# Patient Record
Sex: Female | Born: 1940 | Race: Black or African American | Hispanic: No | Smoking: Former smoker
Health system: Southern US, Community
[De-identification: ages and names within clinical notes are randomized; demographics above are authoritative.]

## PROBLEM LIST (undated history)

## (undated) DIAGNOSIS — Z7901 Long term (current) use of anticoagulants: Secondary | ICD-10-CM

## (undated) DIAGNOSIS — H269 Unspecified cataract: Secondary | ICD-10-CM

## (undated) DIAGNOSIS — I10 Essential (primary) hypertension: Secondary | ICD-10-CM

## (undated) DIAGNOSIS — G40909 Epilepsy, unspecified, not intractable, without status epilepticus: Secondary | ICD-10-CM

## (undated) DIAGNOSIS — M159 Polyosteoarthritis, unspecified: Secondary | ICD-10-CM

## (undated) DIAGNOSIS — I5032 Chronic diastolic (congestive) heart failure: Secondary | ICD-10-CM

## (undated) DIAGNOSIS — M1990 Unspecified osteoarthritis, unspecified site: Secondary | ICD-10-CM

## (undated) DIAGNOSIS — R011 Cardiac murmur, unspecified: Secondary | ICD-10-CM

## (undated) DIAGNOSIS — N183 Chronic kidney disease, stage 3 unspecified: Secondary | ICD-10-CM

## (undated) DIAGNOSIS — I4819 Other persistent atrial fibrillation: Secondary | ICD-10-CM

## (undated) DIAGNOSIS — E0590 Thyrotoxicosis, unspecified without thyrotoxic crisis or storm: Secondary | ICD-10-CM

## (undated) DIAGNOSIS — F0390 Unspecified dementia without behavioral disturbance: Secondary | ICD-10-CM

## (undated) DIAGNOSIS — F1021 Alcohol dependence, in remission: Secondary | ICD-10-CM

## (undated) DIAGNOSIS — I639 Cerebral infarction, unspecified: Secondary | ICD-10-CM

## (undated) DIAGNOSIS — E785 Hyperlipidemia, unspecified: Secondary | ICD-10-CM

## (undated) DIAGNOSIS — M109 Gout, unspecified: Secondary | ICD-10-CM

## (undated) DIAGNOSIS — E559 Vitamin D deficiency, unspecified: Secondary | ICD-10-CM

## (undated) DIAGNOSIS — R001 Bradycardia, unspecified: Secondary | ICD-10-CM

## (undated) DIAGNOSIS — R609 Edema, unspecified: Secondary | ICD-10-CM

## (undated) HISTORY — DX: Long term (current) use of anticoagulants: Z79.01

## (undated) HISTORY — DX: Chronic kidney disease, stage 3 unspecified: N18.30

## (undated) HISTORY — DX: Thyrotoxicosis, unspecified without thyrotoxic crisis or storm: E05.90

## (undated) HISTORY — DX: Polyosteoarthritis, unspecified: M15.9

## (undated) HISTORY — DX: Edema, unspecified: R60.9

## (undated) HISTORY — DX: Alcohol dependence, in remission: F10.21

## (undated) HISTORY — DX: Unspecified cataract: H26.9

## (undated) HISTORY — DX: Bradycardia, unspecified: R00.1

## (undated) HISTORY — DX: Cardiac murmur, unspecified: R01.1

## (undated) HISTORY — DX: Chronic diastolic (congestive) heart failure: I50.32

## (undated) HISTORY — DX: Gout, unspecified: M10.9

## (undated) HISTORY — DX: Other persistent atrial fibrillation: I48.19

## (undated) HISTORY — DX: Chronic kidney disease, stage 3 (moderate): N18.3

## (undated) HISTORY — DX: Vitamin D deficiency, unspecified: E55.9

## (undated) HISTORY — DX: Epilepsy, unspecified, not intractable, without status epilepticus: G40.909

---

## 2010-05-15 ENCOUNTER — Ambulatory Visit: Payer: Self-pay | Admitting: Internal Medicine

## 2010-05-15 ENCOUNTER — Encounter (INDEPENDENT_AMBULATORY_CARE_PROVIDER_SITE_OTHER): Payer: Self-pay | Admitting: Internal Medicine

## 2010-05-15 DIAGNOSIS — R82998 Other abnormal findings in urine: Secondary | ICD-10-CM

## 2010-05-15 DIAGNOSIS — I10 Essential (primary) hypertension: Secondary | ICD-10-CM

## 2010-05-15 DIAGNOSIS — E7800 Pure hypercholesterolemia, unspecified: Secondary | ICD-10-CM

## 2010-05-15 DIAGNOSIS — F0390 Unspecified dementia without behavioral disturbance: Secondary | ICD-10-CM

## 2010-05-15 DIAGNOSIS — M76899 Other specified enthesopathies of unspecified lower limb, excluding foot: Secondary | ICD-10-CM | POA: Insufficient documentation

## 2010-05-15 DIAGNOSIS — F1021 Alcohol dependence, in remission: Secondary | ICD-10-CM | POA: Insufficient documentation

## 2010-05-15 DIAGNOSIS — M129 Arthropathy, unspecified: Secondary | ICD-10-CM | POA: Insufficient documentation

## 2010-05-15 LAB — CONVERTED CEMR LAB
ALT: 12 units/L (ref 0–35)
AST: 21 units/L (ref 0–37)
Alkaline Phosphatase: 157 units/L — ABNORMAL HIGH (ref 39–117)
Basophils Absolute: 0.1 10*3/uL (ref 0.0–0.1)
Basophils Relative: 1 % (ref 0–1)
Calcium: 9.2 mg/dL (ref 8.4–10.5)
Chloride: 106 meq/L (ref 96–112)
Creatinine, Ser: 0.95 mg/dL (ref 0.40–1.20)
LDL Cholesterol: 179 mg/dL — ABNORMAL HIGH (ref 0–99)
MCHC: 31.6 g/dL (ref 30.0–36.0)
Monocytes Absolute: 0.5 10*3/uL (ref 0.1–1.0)
Neutro Abs: 3.4 10*3/uL (ref 1.7–7.7)
Neutrophils Relative %: 53 % (ref 43–77)
Potassium: 4.8 meq/L (ref 3.5–5.3)
RDW: 15.1 % (ref 11.5–15.5)
Total CHOL/HDL Ratio: 3.9

## 2010-05-16 ENCOUNTER — Encounter (INDEPENDENT_AMBULATORY_CARE_PROVIDER_SITE_OTHER): Payer: Self-pay | Admitting: Internal Medicine

## 2010-05-17 ENCOUNTER — Encounter (INDEPENDENT_AMBULATORY_CARE_PROVIDER_SITE_OTHER): Payer: Self-pay | Admitting: Internal Medicine

## 2010-05-22 ENCOUNTER — Ambulatory Visit
Admission: RE | Admit: 2010-05-22 | Discharge: 2010-05-22 | Payer: Self-pay | Source: Home / Self Care | Attending: Internal Medicine | Admitting: Internal Medicine

## 2010-05-30 ENCOUNTER — Encounter (INDEPENDENT_AMBULATORY_CARE_PROVIDER_SITE_OTHER): Payer: Self-pay | Admitting: *Deleted

## 2010-06-21 NOTE — Letter (Signed)
Summary: Generic Letter  [facility]  [address] [place], [place] [address]   Phone: [phone]  Fax: [phone]    05/30/2010  [facility] [address] [place], [place]  [address]  Dear Ms. Markson,  We have been unable to reach you by phone. Your urine culture came back indicating you had small amount of bacteria. Dr. Delrae Alfred believes it could have been contaminated during collection. If you are having any problems urinating, burning sensation or flank pain call our office.     Sincerely,   Gaylyn Cheers RN

## 2010-06-21 NOTE — Assessment & Plan Note (Signed)
Summary: BP recheck  Nurse Visit   Vital Signs:  Patient profile:   70 year old female Menstrual status:  postmenopausal Pulse rate:   68 / minute Pulse rhythm:   regular Resp:     16 per minute BP sitting:   158 / 86  (right arm) Cuff size:   regular  Vitals Entered By: Dutch Quint RN (May 22, 2010 12:19 PM)  Patient Instructions: 1)  Reviewed with Dr. Delrae Alfred 2)  Your blood pressure is greatly improved! 3)  Continue medications as ordered. 4)  Keep scheduled appointment with provider. 5)  Call if anything changes or if you have any questions.   CC:  BP recheck.  History of Present Illness: 05/15/10 BP 234/100.  Had been off meds x3 months.  Restarted medications the next day, took meds this morning.  States not having any problems.   Review of Systems CV:  States asymptomatic -- denies cough, headache, visual changes, peripheral edema.   Physical Exam  General:  alert, well-developed, well-nourished, and well-hydrated.     Impression & Recommendations:  Problem # 1:  HYPERTENSION, ESSENTIAL (ICD-401.9) Restarted meds - BP greatly improved Continue meds Keep scheduled appt. with provider  Her updated medication list for this problem includes:    Carvedilol 6.25 Mg Tabs (Carvedilol) .Marland Kitchen... 1 tab by mouth two times a day    Lisinopril-hydrochlorothiazide 20-25 Mg Tabs (Lisinopril-hydrochlorothiazide) .Marland Kitchen... 1 tab by mouth daily    Amlodipine Besylate 10 Mg Tabs (Amlodipine besylate) .Marland Kitchen... 1 tab by mouth daily  Complete Medication List: 1)  Carvedilol 6.25 Mg Tabs (Carvedilol) .Marland Kitchen.. 1 tab by mouth two times a day 2)  Pravastatin Sodium 40 Mg Tabs (Pravastatin sodium) .Marland Kitchen.. 1 tab by mouth daily with meal 3)  Lisinopril-hydrochlorothiazide 20-25 Mg Tabs (Lisinopril-hydrochlorothiazide) .Marland Kitchen.. 1 tab by mouth daily 4)  Amlodipine Besylate 10 Mg Tabs (Amlodipine besylate) .Marland Kitchen.. 1 tab by mouth daily 5)  Naproxen 500 Mg Tabs (Naproxen) .Marland Kitchen.. 1 tab by mouth two times a  day with food.  CC: BP recheck Is Patient Diabetic? No Pain Assessment Patient in pain? yes     Location: hips, knees, ankles Intensity: 6 Type: throbbing Onset of pain  Chronic  Does patient need assistance? Functional Status Self care Ambulation Normal   Allergies: 1)  ! Penicillin  Orders Added: 1)  Est. Patient Level I [[address]]

## 2010-06-21 NOTE — Letter (Signed)
Summary: Lipid Letter  [facility]  [address] [place], [place] [address]   Phone: [phone]  Fax: [phone]    05/17/2010  Regina Sandoval [address] [place], [place]  [address]  Dear Regina Sandoval:  We have carefully reviewed your last lipid profile from 05/15/2010 and the results are noted below with a summary of recommendations for lipid management.    Cholesterol:       264     Goal: <200   HDL "good" Cholesterol:   68     Goal: >45   LDL "bad" Cholesterol:   179     Goal: <100   Triglycerides:       83     Goal: <150    Total cholesterol and LDL are too high.  HDL and triglycerides are at very good levels.  One liver enzyme was slightly high--cannot say what that means, but will recheck at a follow up.  Rest of labs were okay.    TLC Diet (Therapeutic Lifestyle Change): Saturated Fats & Transfatty acids should be kept < 7% of total calories ***Reduce Saturated Fats Polyunstaurated Fat can be up to 10% of total calories Monounsaturated Fat Fat can be up to 20% of total calories Total Fat should be no greater than 25-35% of total calories Carbohydrates should be 50-60% of total calories Protein should be approximately 15% of total calories Fiber should be at least 20-30 grams a day ***Increased fiber may help lower LDL Total Cholesterol should be < 200mg /day Consider adding plant stanol/sterols to diet (example: Benacol spread) ***A higher intake of unsaturated fat may reduce Triglycerides and Increase HDL    Adjunctive Measures (may lower LIPIDS and reduce risk of Heart Attack) include: Aerobic Exercise (20-30 minutes 3-4 times a week) Limit Alcohol Consumption Weight Reduction Aspirin 75-81 mg a day by mouth (if not allergic or contraindicated) Dietary Fiber 20-30 grams a day by mouth     Current Medications: 1)    Carvedilol 6.25 Mg Tabs (Carvedilol) .Marland Kitchen.. 1 tab by mouth two times a day 2)    Pravastatin Sodium 40 Mg Tabs  (Pravastatin sodium) .Marland Kitchen.. 1 tab by mouth daily with meal 3)    Lisinopril-hydrochlorothiazide 20-25 Mg Tabs (Lisinopril-hydrochlorothiazide) .Marland Kitchen.. 1 tab by mouth daily 4)    Amlodipine Besylate 10 Mg Tabs (Amlodipine besylate) .Marland Kitchen.. 1 tab by mouth daily 5)    Naproxen 500 Mg Tabs (Naproxen) .Marland Kitchen.. 1 tab by mouth two times a day with food.  If you have any questions, please call. We appreciate being able to work with you.   Sincerely,    [facility]

## 2010-06-21 NOTE — Assessment & Plan Note (Signed)
Summary: NEW PATIENT / HTN  / NS   Vital Signs:  Patient profile:   70 year old female Menstrual status:  postmenopausal Height:      67 inches Weight:      193 pounds BMI:     30.34 Temp:     98.1 degrees F oral Pulse rate:   78 / minute Pulse rhythm:   regular Resp:     18 per minute BP sitting:   234 / 100  (left arm) Cuff size:   regular  Vitals Entered By: Hale Drone CMA (May 15, 2010 8:50 AM) CC: Here to establish care. Arthritis concerns. Moved from [place]. Completed ROI for [place] to obtain health records. Allergic to penicillin but does not recall what reaction she gets to it since daughter states she suffers from dementia.  Is Patient Diabetic? No Pain Assessment Patient in pain? yes     Location: legs/Hands Intensity: 6 Type: aching Onset of pain  Intermittent  Does patient need assistance? Functional Status Self care Ambulation Normal     Menstrual Status postmenopausal   CC:  Here to establish care. Arthritis concerns. Moved from [place]. Completed ROI for [place] to obtain health records. Allergic to penicillin but does not recall what reaction she gets to it since daughter states she suffers from dementia. Marland Kitchen  History of Present Illness: 70 yo female here to establish.  Daughter a pt. of Jesse Fall, FNP  Concerns:  1.  Arthritis:  Pain starts in right ankle and seems to radiate up to kneecap and up to right lateral thigh.  Knee can swell at times--though daughter has not noted any swelling.  Problems with this on and off for 20 years.  No history of injury.  No significant physical activity.  Hands have been bothering her more in recent months--points to MCPs and PIPs.  No morning symptoms or stiffeness.  Notes joint problems more so with rainy days.  Has been on medication that helped, but daughter left empty bottles at home.  May have been on Etodolac.  2.  Hypertension:  over 20 years.  Controlled in past with combination of Lisinopril and couple other  meds which daughter cannot recall.  Has been off meds for about 3 months.  BP even on meds 150/90 range in past.    3.  Dementia:  diagnosed more than 20 years ago.  Saw a neurologist in [place] last year.  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  Reportedly no evidence of stroke or NPH.  Sounds like had MRI/MRA of head and neck.   Tried one med--does not recognize Aricept or Namenda-- for dementia--6 weeks in duration without any improvement per daughter, so stopped.  Daughter states they did go back for evaluation after trial and were told may stop.    4.  After visit, daughter gives history of trembling episodes with eyes rolled back associated with vomiting.  Was seen at [facility] for 2-3 days with no reported concerns.  Was told she was just stressed.  This was when homeless.    Current Medications (verified): 1)  None  Allergies (verified): 1)  ! Penicillin  Family History: Mother, died early 56s:  murdered. Father, never knew No biologic siblings. Daughter, 48:  DM, hypertension. Daughter, 74:  Not clear what her issues are--possibly a hypochondriac, does have schizophrenia. Son, 20:  healthy Son, 38:  DM Daughter, 27:  Hypertension  Social History: Adopted as an infant by maternal aunt Originally  from [place], [place] Lived in [place] for 71/2 years before coming to [place] in 08/2009--daughter with whom she lives was leaving a bad marriage.  Homeless for 2 months, now in housing.  Lives at home with daughter, and 63, 48 year old granddaughters. Tobacco:  Started in teens.  Quit 02/2010.  Smoked 1ppd Alcohol:  Quit completely in 2009.  History of alcoholism in 30s-40s Hanging with the wrong crowd and also found her own mother dead after being murdered--suffered a nervous breakdown subsequently for about 1 1/2 years was hospitalized for this. Drug use:  never.  Review of Systems CV:  occasional edema.  Physical Exam  General:   Overweight, NAD Mouth:  pharynx pink and moist, poor dentition, and teeth missing.   Lungs:  Normal respiratory effort, chest expands symmetrically. Lungs are clear to auscultation, no crackles or wheezes. Heart:  Normal rate and regular rhythm. S1 and S2 normal without gallop, murmur, click, rub or other extra sounds.  Radial pulses normal and equal Abdomen:  Bowel sounds positive,abdomen soft and non-tender without masses, organomegaly or hernias noted. Msk:  Very tender over right greater trochanter. No hypertrophic changes or redness and swelling of knees, or joints of hands.  No effusion, synovial thickening, redness or tenderness today. Full ROM of all hand joints and knees. 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 Was also on Tramadol as needed, but would like to hold until see how does with PT Orders: Physical Therapy Referral (PT)  Problem # 2:  HYPERCHOLESTEROLEMIA (ICD-272.0) This noted after getting list of meds--was on Simvastatin--switch to Pravastatin Her updated medication list for this problem includes:    Pravastatin Sodium 40 Mg Tabs (Pravastatin sodium) .Marland Kitchen... 1 tab by mouth daily with meal  Problem # 3:  HYPERTENSION, ESSENTIAL (ICD-401.9) Poorly controlled off meds--restart. Switch Taztia to Amlodipine to avoid bradycardia with Carvedilol. Her updated medication list for this problem includes:    Carvedilol 6.25 Mg Tabs (Carvedilol) .Marland Kitchen... 1 tab by mouth two times a day    Lisinopril-hydrochlorothiazide 20-25 Mg Tabs (Lisinopril-hydrochlorothiazide) .Marland Kitchen... 1 tab by mouth daily    Amlodipine Besylate 10 Mg Tabs (Amlodipine besylate) .Marland Kitchen... 1 tab by mouth daily  Orders: T-Comprehensive Metabolic Panel [phone]) T-CBC w/Diff (09811-91478)  Problem # 4:  ARTHRITIS (ICD-716.90) Suspect leg complaints mainly from bursitis--not much in way of joint changes  on exam Orders: T-Comprehensive Metabolic Panel (29562-13086) T-CBC w/Diff (57846-96295)  Problem # 5:  HEALTH SCREENING (ICD-V70.0) flu vaccine today Orders: T-Lipid Profile ([id number])  Problem # 6:  DEMENTIA (ICD-294.8) Send for record  Problem # 7:  Trembling episodes Send for records at [facility]  Complete Medication List: 1)  Carvedilol 6.25 Mg Tabs (Carvedilol) .Marland Kitchen.. 1 tab by mouth two times a day 2)  Pravastatin Sodium 40 Mg Tabs (Pravastatin sodium) .Marland Kitchen.. 1 tab by mouth daily with meal 3)  Lisinopril-hydrochlorothiazide 20-25 Mg Tabs (Lisinopril-hydrochlorothiazide) .Marland Kitchen.. 1 tab by mouth daily 4)  Amlodipine Besylate 10 Mg Tabs (Amlodipine besylate) .Marland Kitchen.. 1 tab by mouth daily 5)  Naproxen 500 Mg Tabs (Naproxen) .Marland Kitchen.. 1 tab by mouth two times a day with food.  Other Orders: Flu Vaccine 20yrs + (02725) Admin 1st Vaccine (36644)  Patient Instructions: 1)  Release of info from Neurology in [place], M.D. in [place], [place]   2)  Release of info from Dr. Alyce Pagan also from [place] [place] 3)  Release of  info from [facility]. 4)  [facility] visit for bp check in 1 week. 5)  Call if light headed or weak/fatigued 6)  Follow up with Dr. Delrae Alfred in 6 weeks for bp, etc Prescriptions: CARVEDILOL 6.25 MG TABS (CARVEDILOL) 1 tab by mouth two times a day  #60 x 11   Entered and Authorized by:   Julieanne Manson MD   Signed by:   Julieanne Manson MD on 05/15/2010   Method used:   Electronically to        [facility] [phone]* (retail)       [address]       [place], [place]  [address]       Ph: [phone]       Fax: [phone]   RxID:   [id number] NAPROXEN 500 MG TABS (NAPROXEN) 1 tab by mouth two times a day with food.  #60 x 4   Entered and Authorized by:   Julieanne Manson MD   Signed by:   Julieanne Manson MD on 05/15/2010   Method used:   Electronically to        [facility] [phone]*  (retail)       [address]       [place], [place]  [address]       Ph: [phone]       Fax: [phone]   RxID:   [id number] AMLODIPINE BESYLATE 10 MG TABS (AMLODIPINE BESYLATE) 1 tab by mouth daily  #30 x 11   Entered and Authorized by:   Julieanne Manson MD   Signed by:   Julieanne Manson MD on 05/15/2010   Method used:   Electronically to        [facility] [phone]* (retail)       [address]       [place], [place]  [address]       Ph: [phone]       Fax: [phone]   RxID:   [id number] LISINOPRIL-HYDROCHLOROTHIAZIDE 20-25 MG TABS (LISINOPRIL-HYDROCHLOROTHIAZIDE) 1 tab by mouth daily  #30 x 11   Entered and Authorized by:   Julieanne Manson MD   Signed by:   Julieanne Manson MD on 05/15/2010   Method used:   Electronically to        [facility] [phone]* (retail)       [address]       [place], [place]  [address]       Ph: [phone]       Fax: [phone]   RxID:   [id number] PRAVASTATIN SODIUM 40 MG TABS (PRAVASTATIN SODIUM) 1 tab by mouth daily with meal  #30 x 11   Entered and Authorized by:   Julieanne Manson MD   Signed by:   Julieanne Manson MD on 05/15/2010   Method used:   Electronically to        [facility] [phone]* (retail)       [address]       [place], [place]  [address]       Ph: [phone]       Fax: [phone]   RxID:   [id number] CARVEDILOL 6.25 MG TABS (CARVEDILOL) 1 tab by mouth two times a day  #60 x 11   Entered and Authorized by:   Julieanne Manson MD   Signed by:   Julieanne Manson MD on 05/15/2010   Method used:   Print then Give to Patient   RxID:   [id number]  Orders Added: 1)  Flu Vaccine 57yrs + [90658] 2)  Admin 1st Vaccine [90471] 3)  T-Comprehensive Metabolic Panel [80053-22900] 4)  T-CBC w/Diff [09811-91478] 5)  T-Lipid Profile [80061-22930] 6)  New Patient Level III [29[address]] 7)  Physical Therapy Referral [PT]   Immunizations Administered:  Influenza  Vaccine # 1:    Vaccine Type: Fluvax 3+    Site: left deltoid    Mfr: GlaxoSmithKline    Dose: 0.5 ml    Route: IM    Given by: Hale Drone CMA    Exp. Date: 11/17/2010    Lot #: [id number]    VIS given: 12/12/09 version given May 15, 2010.  Flu Vaccine Consent Questions:    Do you have a history of severe allergic reactions to this vaccine? no    Any prior history of allergic reactions to egg and/or gelatin? no    Do you have a sensitivity to the preservative Thimersol? no    Do you have a past history of Guillan-Barre Syndrome? no    Do you currently have an acute febrile illness? no    Have you ever had a severe reaction to latex? no    Vaccine information given and explained to patient? yes    Are you currently pregnant? no   Immunizations Administered:  Influenza Vaccine # 1:    Vaccine Type: Fluvax 3+    Site: left deltoid    Mfr: GlaxoSmithKline    Dose: 0.5 ml    Route: IM    Given by: Hale Drone CMA    Exp. Date: 11/17/2010    Lot #: [id number]    VIS given: 12/12/09 version given May 15, 2010.   Appended Document: Lab Order    Lab Visit  Laboratory Results   Urine Tests  Date/Time Received: May 15, 2010 12:19 PM   Routine Urinalysis   Color: lt. yellow Appearance: Clear Glucose: negative   (Normal Range: Negative) Bilirubin: negative   (Normal Range: Negative) Ketone: negative   (Normal Range: Negative) Spec. Gravity: 1.015   (Normal Range: 1.003-1.035) Blood: trace-intact   (Normal Range: Negative) pH: 6.0   (Normal Range: 5.0-8.0) Protein: negative   (Normal Range: Negative) Urobilinogen: 0.2   (Normal Range: 0-1) Nitrite: negative   (Normal Range: Negative) Leukocyte Esterace: trace   (Normal Range: Negative)      Orders Today: UA Dipstick w/o Micro (automated)  [81003] T-Culture, Urine [[id number]]

## 2011-05-17 ENCOUNTER — Encounter: Payer: Self-pay | Admitting: Emergency Medicine

## 2011-05-17 ENCOUNTER — Emergency Department (INDEPENDENT_AMBULATORY_CARE_PROVIDER_SITE_OTHER)
Admission: EM | Admit: 2011-05-17 | Discharge: 2011-05-17 | Disposition: A | Discharge status: Home / Self Care | Payer: Medicare Other | Source: Home / Self Care

## 2011-05-17 DIAGNOSIS — J069 Acute upper respiratory infection, unspecified: Secondary | ICD-10-CM

## 2011-05-17 DIAGNOSIS — R05 Cough: Secondary | ICD-10-CM

## 2011-05-17 DIAGNOSIS — I10 Essential (primary) hypertension: Secondary | ICD-10-CM

## 2011-05-17 HISTORY — DX: Essential (primary) hypertension: I10

## 2011-05-17 HISTORY — DX: Unspecified osteoarthritis, unspecified site: M19.90

## 2011-05-17 HISTORY — DX: Unspecified dementia, unspecified severity, without behavioral disturbance, psychotic disturbance, mood disturbance, and anxiety: F03.90

## 2011-05-17 HISTORY — DX: Hyperlipidemia, unspecified: E78.5

## 2011-05-17 MED ORDER — CARVEDILOL 6.25 MG PO TABS: 6.2500 mg | ORAL_TABLET | Freq: Two times a day (BID) | ORAL | Status: DC

## 2011-05-17 MED ORDER — PRAVASTATIN SODIUM 40 MG PO TABS: 40.0000 mg | ORAL_TABLET | ORAL | Status: DC

## 2011-05-17 MED ORDER — SULFAMETHOXAZOLE-TRIMETHOPRIM 800-160 MG PO TABS: 1.0000 | ORAL_TABLET | Freq: Two times a day (BID) | ORAL | Status: AC

## 2011-05-17 MED ORDER — NAPROXEN 500 MG PO TABS: 500.0000 mg | ORAL_TABLET | Freq: Two times a day (BID) | ORAL | Status: DC | PRN

## 2011-05-17 MED ORDER — AMLODIPINE BESYLATE 10 MG PO TABS: 10.0000 mg | ORAL_TABLET | Freq: Every day | ORAL | Status: DC

## 2011-05-17 MED ORDER — LISINOPRIL-HYDROCHLOROTHIAZIDE 20-25 MG PO TABS: 1.0000 | ORAL_TABLET | Freq: Every day | ORAL | Status: DC

## 2011-05-17 NOTE — ED Provider Notes (Signed)
Medical screening examination/treatment/procedure(s) were performed by non-physician practitioner and as supervising physician I was immediately available for consultation/collaboration.  Raynald Blend, MD 05/17/11 1110

## 2011-05-17 NOTE — ED Notes (Signed)
Pt c/o severe cough for 2 days. Congestion and nasal discharge.

## 2011-05-17 NOTE — ED Provider Notes (Signed)
History     CSN: [id number]  Arrival date & time 05/17/11  4098   None     Chief Complaint  Patient presents with  . Cough    (Consider location/radiation/quality/duration/timing/severity/associated sxs/prior treatment) HPI Comments: Onset of nasal congestion and cough 2 days ago. Pt states the cough is productive with yellow mucus and she has a lot of thick yellow mucus and drainage as well. No fever, dyspnea or wheezing. Pt ran out of her medications approx 2 weeks ago. Her daughter states she has been out due to financial reasons, but when she checked to get refilled yesterday noticed that she does not have any refills. Her next appt with [facility] is the end of January 2013. [facility] recommended that she be seen here at [facility] today. Pt takes Naprosyn prn for knee arthritis. Daughter states surgery was recommended but pt refuses. No chest pain or peripheral edema.   The history is provided by the patient and a relative. History Limited By: hx of dementia.    Past Medical History  Diagnosis Date  . Arthritis   . Hypertension   . Dementia   . Hyperlipidemia     History reviewed. No pertinent past surgical history.  History reviewed. No pertinent family history.  History  Substance Use Topics  . Smoking status: Not on file  . Smokeless tobacco: Not on file  . Alcohol Use:     OB History    Grav Para Term Preterm Abortions TAB SAB Ect Mult Living                  Review of Systems  Constitutional: Negative for fever and chills.  HENT: Positive for congestion, rhinorrhea and postnasal drip. Negative for ear pain, sore throat and sinus pressure.   Respiratory: Positive for cough. Negative for shortness of breath and wheezing.   Cardiovascular: Negative for chest pain, palpitations and leg swelling.    Allergies  Penicillins  Home Medications   Current Outpatient Rx  Name Route Sig Dispense Refill  . AMLODIPINE BESYLATE 10 MG PO TABS Oral Take 1 tablet (10  mg total) by mouth daily. 30 tablet 0  . CARVEDILOL 6.25 MG PO TABS Oral Take 1 tablet (6.25 mg total) by mouth 2 (two) times daily at 10 AM and 5 PM. 60 tablet 0  . LISINOPRIL-HYDROCHLOROTHIAZIDE 20-25 MG PO TABS Oral Take 1 tablet by mouth daily. 30 tablet 0  . NAPROXEN 500 MG PO TABS Oral Take 1 tablet (500 mg total) by mouth 2 (two) times daily as needed. 30 tablet 0  . PRAVASTATIN SODIUM 40 MG PO TABS Oral Take 1 tablet (40 mg total) by mouth 1 day or 1 dose. 30 tablet 0  . SULFAMETHOXAZOLE-TRIMETHOPRIM 800-160 MG PO TABS Oral Take 1 tablet by mouth every 12 (twelve) hours. 14 tablet 0    BP 159/94  Pulse 110  Temp(Src) 98.9 F (37.2 C) (Oral)  Resp 16  SpO2 100%  Physical Exam  Nursing note and vitals reviewed. Constitutional: She appears well-developed and well-nourished. No distress.  HENT:  Head: Normocephalic and atraumatic.  Right Ear: Tympanic membrane, external ear and ear canal normal.  Left Ear: Tympanic membrane, external ear and ear canal normal.  Nose: Nose normal.  Mouth/Throat: Uvula is midline, oropharynx is clear and moist and mucous membranes are normal. No oropharyngeal exudate, posterior oropharyngeal edema or posterior oropharyngeal erythema.  Neck: Neck supple.  Cardiovascular: Normal rate, regular rhythm and normal heart sounds.   Pulmonary/Chest: Effort normal  and breath sounds normal. No respiratory distress.       No cough noted during visit.   Lymphadenopathy:    She has no cervical adenopathy.  Neurological: She is alert.  Skin: Skin is warm and dry.  Psychiatric: She has a normal mood and affect.    ED Course  Procedures (including critical care time)  Labs Reviewed - No data to display No results found.   1. URI (upper respiratory infection)   2. Cough   3. 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 Exam neg, afebrile.  Has been out of meds x 2 wks. Next appt with [facility] in 4 weeks.         Melody Comas, [place] 05/17/11 1106

## 2012-07-16 ENCOUNTER — Other Ambulatory Visit (HOSPITAL_COMMUNITY): Payer: Self-pay | Admitting: Family Medicine

## 2012-07-16 DIAGNOSIS — R011 Cardiac murmur, unspecified: Secondary | ICD-10-CM

## 2012-07-23 ENCOUNTER — Ambulatory Visit (HOSPITAL_COMMUNITY)
Admission: RE | Admit: 2012-07-23 | Discharge: 2012-07-23 | Disposition: A | Discharge status: Home / Self Care | Payer: Medicare (Managed Care) | Source: Ambulatory Visit | Attending: Family Medicine | Admitting: Family Medicine

## 2012-07-23 DIAGNOSIS — R011 Cardiac murmur, unspecified: Secondary | ICD-10-CM

## 2012-07-23 DIAGNOSIS — I369 Nonrheumatic tricuspid valve disorder, unspecified: Secondary | ICD-10-CM

## 2012-07-23 NOTE — Progress Notes (Signed)
  Echocardiogram 2D Echocardiogram has been performed.  Regina Sandoval A 07/23/2012, 11:43 AM

## 2013-02-26 ENCOUNTER — Ambulatory Visit
Admission: RE | Admit: 2013-02-26 | Discharge: 2013-02-26 | Disposition: A | Discharge status: Home / Self Care | Payer: No Typology Code available for payment source | Source: Ambulatory Visit | Attending: *Deleted | Admitting: *Deleted

## 2013-02-26 ENCOUNTER — Other Ambulatory Visit: Payer: Self-pay | Admitting: *Deleted

## 2013-02-26 DIAGNOSIS — R609 Edema, unspecified: Secondary | ICD-10-CM

## 2013-02-26 DIAGNOSIS — R52 Pain, unspecified: Secondary | ICD-10-CM

## 2013-07-24 ENCOUNTER — Emergency Department (HOSPITAL_COMMUNITY): Payer: Medicare (Managed Care)

## 2013-07-24 ENCOUNTER — Encounter (HOSPITAL_COMMUNITY): Payer: Self-pay | Admitting: Emergency Medicine

## 2013-07-24 ENCOUNTER — Emergency Department (HOSPITAL_COMMUNITY)
Admission: EM | Admit: 2013-07-24 | Discharge: 2013-07-24 | Disposition: A | Discharge status: Home / Self Care | Payer: Medicare (Managed Care) | Attending: Emergency Medicine | Admitting: Emergency Medicine

## 2013-07-24 DIAGNOSIS — F0390 Unspecified dementia without behavioral disturbance: Secondary | ICD-10-CM | POA: Insufficient documentation

## 2013-07-24 DIAGNOSIS — M129 Arthropathy, unspecified: Secondary | ICD-10-CM | POA: Insufficient documentation

## 2013-07-24 DIAGNOSIS — Z880 Allergy status to penicillin: Secondary | ICD-10-CM | POA: Insufficient documentation

## 2013-07-24 DIAGNOSIS — E785 Hyperlipidemia, unspecified: Secondary | ICD-10-CM | POA: Insufficient documentation

## 2013-07-24 DIAGNOSIS — Z87891 Personal history of nicotine dependence: Secondary | ICD-10-CM | POA: Insufficient documentation

## 2013-07-24 DIAGNOSIS — Z79899 Other long term (current) drug therapy: Secondary | ICD-10-CM | POA: Insufficient documentation

## 2013-07-24 DIAGNOSIS — I10 Essential (primary) hypertension: Secondary | ICD-10-CM | POA: Insufficient documentation

## 2013-07-24 DIAGNOSIS — R569 Unspecified convulsions: Secondary | ICD-10-CM | POA: Insufficient documentation

## 2013-07-24 LAB — CBC WITH DIFFERENTIAL/PLATELET
BASOS PCT: 1 % (ref 0–1)
Basophils Absolute: 0.1 10*3/uL (ref 0.0–0.1)
Eosinophils Absolute: 0.1 10*3/uL (ref 0.0–0.7)
Eosinophils Relative: 1 % (ref 0–5)
HEMATOCRIT: 35.9 % — AB (ref 36.0–46.0)
HEMOGLOBIN: 11.9 g/dL — AB (ref 12.0–15.0)
LYMPHS ABS: 3.4 10*3/uL (ref 0.7–4.0)
Lymphocytes Relative: 29 % (ref 12–46)
MCH: 29.2 pg (ref 26.0–34.0)
MCHC: 33.1 g/dL (ref 30.0–36.0)
MCV: 88.2 fL (ref 78.0–100.0)
MONO ABS: 1.2 10*3/uL — AB (ref 0.1–1.0)
MONOS PCT: 10 % (ref 3–12)
NEUTROS ABS: 6.9 10*3/uL (ref 1.7–7.7)
Neutrophils Relative %: 59 % (ref 43–77)
Platelets: 165 10*3/uL (ref 150–400)
RBC: 4.07 MIL/uL (ref 3.87–5.11)
RDW: 14.5 % (ref 11.5–15.5)
WBC: 11.7 10*3/uL — ABNORMAL HIGH (ref 4.0–10.5)

## 2013-07-24 LAB — BASIC METABOLIC PANEL
BUN: 24 mg/dL — AB (ref 6–23)
CHLORIDE: 102 meq/L (ref 96–112)
CO2: 21 meq/L (ref 19–32)
CREATININE: 1.14 mg/dL — AB (ref 0.50–1.10)
Calcium: 9.3 mg/dL (ref 8.4–10.5)
GFR calc Af Amer: 54 mL/min — ABNORMAL LOW (ref >90)
GFR calc non Af Amer: 46 mL/min — ABNORMAL LOW (ref >90)
GLUCOSE: 108 mg/dL — AB (ref 70–99)
Potassium: 4.1 mEq/L (ref 3.7–5.3)
Sodium: 137 mEq/L (ref 137–147)

## 2013-07-24 NOTE — Discharge Instructions (Signed)
Get plenty of rest, eat a regular diet, and drink a lot of fluids. Return here, if needed, for problems.    Seizure, Adult A seizure is abnormal electrical activity in the brain. Seizures usually last from 30 seconds to 2 minutes. There are various types of seizures. Before a seizure, you may have a warning sensation (aura) that a seizure is about to occur. An aura may include the following symptoms:   Fear or anxiety.  Nausea.  Feeling like the room is spinning (vertigo).  Vision changes, such as seeing flashing lights or spots. Common symptoms during a seizure include:  A change in attention or behavior (altered mental status).  Convulsions with rhythmic jerking movements.  Drooling.  Rapid eye movements.  Grunting.  Loss of bladder and bowel control.  Bitter taste in the mouth.  Tongue biting. After a seizure, you may feel confused and sleepy. You may also have an injury resulting from convulsions during the seizure. HOME CARE INSTRUCTIONS   If you are given medicines, take them exactly as prescribed by your health care provider.  Keep all follow-up appointments as directed by your health care provider.  Do not swim or drive or engage in risky activity during which a seizure could cause further injury to you or others until your health care provider says it is OK.  Get adequate rest.  Teach friends and family what to do if you have a seizure. They should:  Lay you on the ground to prevent a fall.  Put a cushion under your head.  Loosen any tight clothing around your neck.  Turn you on your side. If vomiting occurs, this helps keep your airway clear.  Stay with you until you recover.  Know whether or not you need emergency care. SEEK IMMEDIATE MEDICAL CARE IF:  The seizure lasts longer than 5 minutes.  The seizure is severe or you do not wake up immediately after the seizure.  You have an altered mental status after the seizure.  You are having more  frequent or worsening seizures. Someone should drive you to the emergency department or call local emergency services (911 in [place] MAKE SURE YOU:  Understand these instructions.  Will watch your condition.  Will get help right away if you are not doing well or get worse. Document Released: 05/03/2000 Document Revised: 02/24/2013 Document Reviewed: 12/16/2012 [facility] Patient Information 2014 [employer], [place].

## 2013-07-24 NOTE — ED Notes (Signed)
Pt arrives home via EMS for evaluation of possible seizure like activity. Per EMS, daughter of pt states that pt was at kitchen  table this morning around 1145a eating when daughter states pt became "stiff, her mouth drooped to one side, and pt began to fall out of her chair and was shaking." Daughter states that family member caught the pt and pt was not responding to her name being called for 2 minutes. EMS states pt states that she was "agitated" and denies any of the prior events. Pt does have hx of seizures, but is currently on no medications for them. Pt and Family denies any LOC or injury. Pt states pain from arthritis.

## 2013-07-24 NOTE — ED Notes (Addendum)
Gunnar FusiPaula, on call RN from [facility] called and stated that she is to inform health care personal that pt is a [facility] participant and that West BaliMary Anne, NP is pt primary care provider. Informed Dr. Effie ShyWentz and Gunnar Fusiaula was called back stating to have NP come to hospital so that Dr. Effie ShyWentz can discuss health information with the NP. [phone]

## 2013-07-24 NOTE — ED Notes (Signed)
[facility] nurse practitioner West BaliMary anne at bedside.

## 2013-07-24 NOTE — ED Provider Notes (Signed)
CSN: [id number]     Arrival date & time 07/24/13  1222 History   First MD Initiated Contact with Patient 07/24/13 1238     Chief Complaint  Patient presents with  . possible seizure      (Consider location/radiation/quality/duration/timing/severity/associated sxs/prior Treatment) The history is provided by the patient and a relative.   Regina Sandoval is a 73 y.o. female presents for evaluation by EMS, for a seizure. She was sitting at the breakfast table this morning, when she suddenly clenched her right arm to her chest and began shaking all over. A family member prevented her from falling out of the chair. She was shaking for about 90 seconds. She then awoke slowly and was somewhat combative and argumentative. Entire episode resolved after 3 minutes. She did not choke on food. She has been at her baseline since that time. There are no known preceding, or causative factors. She last had a seizure about 4 years ago; and at that time was fully evaluated during a hospitalization. She was not put on antiepileptics. She has had no intervening problems with seizures, syncope, or known stroke. She has been doing well recently with her only complaint being a rhinorrhea that she feels is because of the cold weather. Patient is communicative, but her daughter gives most of the history.  Level 5 caveat: Dementia   Past Medical History  Diagnosis Date  . Arthritis   . Hypertension   . Dementia   . Hyperlipidemia   . Seizures    History reviewed. No pertinent past surgical history. No family history on file. History  Substance Use Topics  . Smoking status: Former Smoker -- 1.00 packs/day for 50 years    Types: Cigarettes  . Smokeless tobacco: Not on file  . Alcohol Use: Not on file   OB History   Grav Para Term Preterm Abortions TAB SAB Ect Mult Living                 Review of Systems  All other systems reviewed and are negative.      Allergies  Penicillins  Home Medications    Current Outpatient Rx  Name  Route  Sig  Dispense  Refill  . acetaminophen (TYLENOL) 500 MG tablet   Oral   Take 500 mg by mouth every 6 (six) hours as needed (Arthritic pain).         Marland Kitchen. amLODipine (NORVASC) 10 MG tablet   Oral   Take 1 tablet (10 mg total) by mouth daily.   30 tablet   0   . carvedilol (COREG) 25 MG tablet   Oral   Take 25 mg by mouth 2 (two) times daily with a meal.         . Cholecalciferol (VITAMIN D PO)   Oral   Take 1 tablet by mouth daily.         Marland Kitchen. lisinopril-hydrochlorothiazide (PRINZIDE,ZESTORETIC) 20-25 MG per tablet   Oral   Take 1 tablet by mouth daily.   30 tablet   0   . pravastatin (PRAVACHOL) 40 MG tablet   Oral   Take 1 tablet (40 mg total) by mouth 1 day or 1 dose.   30 tablet   0    BP 112/65  Pulse 70  Temp(Src) 97.8 F (36.6 C) (Oral)  Resp 20  SpO2 100% Physical Exam  Nursing note and vitals reviewed. Constitutional: She appears well-developed.  Elderly, frail  HENT:  Head: Normocephalic and atraumatic.  She is missing  numerous teeth. There is no trismus  Eyes: Conjunctivae and EOM are normal. Pupils are equal, round, and reactive to light.  Neck: Normal range of motion and phonation normal. Neck supple.  Cardiovascular: Normal rate, regular rhythm and intact distal pulses.   Pulmonary/Chest: Effort normal and breath sounds normal. She exhibits no tenderness.  Abdominal: Soft. She exhibits no distension. There is no tenderness. There is no guarding.  Musculoskeletal: Normal range of motion.  Neurological: She is alert. She exhibits normal muscle tone.  No dysarthria, aphasia, or nystagmus. Normal finger to nose bilaterally. No ataxia.  Skin: Skin is warm and dry.  Psychiatric: She has a normal mood and affect. Her behavior is normal.    ED Course  Procedures (including critical care time)  Medications - No data to display  No data found.  I discussed the case with, the [facility], provider, West Bali; she  came to the department to see the patient. We agreed that the patient could be discharged with close monitoring and followup in two days, without antiepileptic medication.  Evaluation at D/C Reevaluation with update and discussion. After initial assessment and treatment, an updated evaluation reveals no sz. In ED. Pt alert and comfortable. Avangelina Flight L     Labs Review Labs Reviewed  CBC WITH DIFFERENTIAL - Abnormal; Notable for the following:    WBC 11.7 (*)    Hemoglobin 11.9 (*)    HCT 35.9 (*)    Monocytes Absolute 1.2 (*)    All other components within normal limits  BASIC METABOLIC PANEL - Abnormal; Notable for the following:    Glucose, Bld 108 (*)    BUN 24 (*)    Creatinine, Ser 1.14 (*)    GFR calc non Af Amer 46 (*)    GFR calc Af Amer 54 (*)    All other components within normal limits  URINE CULTURE   Imaging Review Dg Chest 2 View  07/24/2013   CLINICAL DATA:  Seizure.  Hypertension.  EXAM: CHEST  2 VIEW  COMPARISON:  01/14/2010  FINDINGS: Cardiac silhouette is normal in size and configuration. Aorta is uncoiled. No mediastinal or hilar masses or adenopathy.  Clear lungs.  No pleural effusion or pneumothorax.  Bony thorax is demineralized but intact.  IMPRESSION: No active cardiopulmonary disease.   Electronically Signed   By: Amie Portland M.D.   On: 07/24/2013 14:05     EKG Interpretation   Date/Time:  Saturday July 24 2013 13:04:01 EST Ventricular Rate:  76 PR Interval:  176 QRS Duration: 78 QT Interval:  416 QTC Calculation: 468 R Axis:   8 Text Interpretation:  Sinus rhythm Atrial premature complex Abnormal  R-wave progression, early transition Abnormal T, consider ischemia,  lateral leads Baseline wander in lead(s) II III aVL aVF No old tracing to  compare Confirmed by [facility]  MD, Imanie Darrow [phone]) on 07/24/2013 3:49:03 PM      MDM   Final diagnoses:  Seizure    Seizure, possibly recurrent, but none recently. No evidence for acute CNS or metabolic  or infectious processes. She does not appear to be taking any medications that are known to cause seizures.  Nursing Notes Reviewed/ Care Coordinated Applicable Imaging Reviewed Interpretation of Laboratory Data incorporated into ED treatment  The patient appears reasonably screened and/or stabilized for discharge and I doubt any other medical condition or other [facility] requiring further screening, evaluation, or treatment in the ED at this time prior to discharge.  Plan: Home Medications- usual; Home Treatments- rest;  return here if the recommended treatment, does not improve the symptoms; Recommended follow up- PCP in 2 days.     Flint Melter, MD 07/26/13 [phone]

## 2015-10-23 ENCOUNTER — Inpatient Hospital Stay (HOSPITAL_COMMUNITY)
Admission: EM | Admit: 2015-10-23 | Discharge: 2015-10-25 | DRG: 190 | Payer: Medicare (Managed Care) | Attending: Oncology | Admitting: Oncology

## 2015-10-23 ENCOUNTER — Encounter (HOSPITAL_COMMUNITY): Payer: Self-pay | Admitting: Emergency Medicine

## 2015-10-23 DIAGNOSIS — Z23 Encounter for immunization: Secondary | ICD-10-CM

## 2015-10-23 DIAGNOSIS — Z79899 Other long term (current) drug therapy: Secondary | ICD-10-CM

## 2015-10-23 DIAGNOSIS — J189 Pneumonia, unspecified organism: Secondary | ICD-10-CM | POA: Diagnosis not present

## 2015-10-23 DIAGNOSIS — I10 Essential (primary) hypertension: Secondary | ICD-10-CM | POA: Diagnosis present

## 2015-10-23 DIAGNOSIS — F17210 Nicotine dependence, cigarettes, uncomplicated: Secondary | ICD-10-CM | POA: Diagnosis present

## 2015-10-23 DIAGNOSIS — J440 Chronic obstructive pulmonary disease with acute lower respiratory infection: Principal | ICD-10-CM | POA: Diagnosis present

## 2015-10-23 DIAGNOSIS — Z880 Allergy status to penicillin: Secondary | ICD-10-CM

## 2015-10-23 DIAGNOSIS — E785 Hyperlipidemia, unspecified: Secondary | ICD-10-CM | POA: Diagnosis present

## 2015-10-23 DIAGNOSIS — F0390 Unspecified dementia without behavioral disturbance: Secondary | ICD-10-CM | POA: Diagnosis present

## 2015-10-23 DIAGNOSIS — J441 Chronic obstructive pulmonary disease with (acute) exacerbation: Secondary | ICD-10-CM | POA: Diagnosis present

## 2015-10-23 HISTORY — DX: Cerebral infarction, unspecified: I63.9

## 2015-10-23 NOTE — ED Notes (Addendum)
PER [facility]: Patient to ED c/o increasing SOB x 3 days, daughter concerned for PNA. Hx asthma, but denies problems. RR initially 40, decreased to 24 after duoneb given by EMS. 20g. LAC, given 125mg  solumedrol. NSR on monitor. Respirations e/u at this time, slight wheezes heard. Patient A&O x 4.

## 2015-10-23 NOTE — ED Provider Notes (Signed)
CSN: [id number]     Arrival date & time 10/23/15  2342 History  By signing my name below, I, Arianna Nassar, attest that this documentation has been prepared under the direction and in the presence of Gilda Creasehristopher J Jester Klingberg, MD. Electronically Signed: Octavia HeirArianna Nassar, ED Scribe. 10/23/2015. 11:57 PM.    Chief Complaint  Patient presents with  . Shortness of Breath   LEVEL 5 CAVEAT DUE TO DEMENTIA  The history is provided by a relative. The history is limited by the condition of the patient. No language interpreter was used.   HPI Comments: Leonette Nuttinglma Humble is a 75 y.o. female brought in by ambulance, with a PMHx of CVA, asthma, gout, dementia, arthritis, HTN, DM, dementia, and HLD who presents to the Emergency Department complaining of constant, gradual worsening, moderate, shortness of breath onset one week ago. She notes an associated productive cough that sounds like "rattling" in her chest. Per daughter, pt had a difficult time walking a short distance without being out of breath. Daughter notes, she and her son recently had pneumonia and she believes that pt might have that. Pt does not have use any treatments for her inhaler or nebulizer at home. She is not on any oxygen at home. Pt denies chest pain and leg swelling. Pt is a former smoker and former alcoholic. Past Medical History  Diagnosis Date  . Arthritis   . Hypertension   . Dementia   . Hyperlipidemia   . Seizures (HCC)    History reviewed. No pertinent past surgical history. No family history on file. Social History  Substance Use Topics  . Smoking status: Former Smoker -- 1.00 packs/day for 50 years    Types: Cigarettes  . Smokeless tobacco: None  . Alcohol Use: None   OB History    No data available     Review of Systems  Unable to perform ROS: Dementia  All other systems reviewed and are negative.     Allergies  Penicillins  Home Medications   Prior to Admission medications   Medication Sig Start Date End Date  Taking? Authorizing Provider  acetaminophen (TYLENOL) 500 MG tablet Take 500 mg by mouth every 6 (six) hours as needed (Arthritic pain).    Historical Provider, MD  amLODipine (NORVASC) 10 MG tablet Take 1 tablet (10 mg total) by mouth daily. 05/17/11   Dawn Vidal SchwalbeM Sampson, PA-C  carvedilol (COREG) 25 MG tablet Take 25 mg by mouth 2 (two) times daily with a meal.    Historical Provider, MD  Cholecalciferol (VITAMIN D PO) Take 1 tablet by mouth daily.    Historical Provider, MD  lisinopril-hydrochlorothiazide (PRINZIDE,ZESTORETIC) 20-25 MG per tablet Take 1 tablet by mouth daily. 05/17/11   Dawn Vidal SchwalbeM Sampson, PA-C  pravastatin (PRAVACHOL) 40 MG tablet Take 1 tablet (40 mg total) by mouth 1 day or 1 dose. 05/17/11   Dawn Vidal SchwalbeM Sampson, PA-C   Triage vitals: BP 151/75 mmHg  Pulse 99  Temp(Src) 99.6 F (37.6 C) (Oral)  Resp 16  SpO2 94% Physical Exam  Constitutional: She appears well-developed and well-nourished. No distress.  Confused and disoriented  HENT:  Head: Normocephalic and atraumatic.  Right Ear: Hearing normal.  Left Ear: Hearing normal.  Nose: Nose normal.  Mouth/Throat: Oropharynx is clear and moist and mucous membranes are normal.  Eyes: Conjunctivae and EOM are normal. Pupils are equal, round, and reactive to light.  Neck: Normal range of motion. Neck supple.  Cardiovascular: Regular rhythm, S1 normal and S2 normal.  Exam reveals  no gallop and no friction rub.   No murmur heard. Pulmonary/Chest: No respiratory distress. She exhibits no tenderness.  Very diminished breath sounds  Abdominal: Soft. Normal appearance and bowel sounds are normal. There is no hepatosplenomegaly. There is no tenderness. There is no rebound, no guarding, no tenderness at McBurney's point and negative Murphy's sign. No hernia.  Musculoskeletal: Normal range of motion.  Neurological: She is alert. She has normal strength. No cranial nerve deficit or sensory deficit. Coordination normal. GCS eye subscore is 4. GCS  verbal subscore is 5. GCS motor subscore is 6.  Skin: Skin is warm, dry and intact. No rash noted. No cyanosis.  Psychiatric: She has a normal mood and affect. Her speech is normal and behavior is normal. Thought content normal.  Nursing note and vitals reviewed.   ED Course  Procedures  DIAGNOSTIC STUDIES: Oxygen Saturation is 94% on RA, normal by my interpretation.  COORDINATION OF CARE:  11:56 PM Will order DG chest view. Discussed treatment plan which includes with daughter at bedside and daughter agreed to plan.  Labs Review Labs Reviewed  CBC WITH DIFFERENTIAL/PLATELET - Abnormal; Notable for the following:    WBC 17.3 (*)    Neutro Abs 15.6 (*)    All other components within normal limits  I-STAT CG4 LACTIC ACID, ED - Abnormal; Notable for the following:    Lactic Acid, Venous 2.75 (*)    All other components within normal limits  I-STAT ARTERIAL BLOOD GAS, ED - Abnormal; Notable for the following:    pO2, Arterial 62.0 (*)    All other components within normal limits  BASIC METABOLIC PANEL  TROPONIN I  BRAIN NATRIURETIC PEPTIDE    Imaging Review Dg Chest 2 View  10/24/2015  CLINICAL DATA:  75 year old female with chest pain EXAM: CHEST  2 VIEW COMPARISON:  Chest radiograph dated 07/24/2013 FINDINGS: Two views of the chest demonstrate an area of increased density at the right lung base which may represent atelectatic changes or developing pneumonia. The left lung is clear. There is no pleural effusion or pneumothorax. The cardiac silhouette is within normal limits. The aorta is tortuous. There is osteopenia. No acute fracture. IMPRESSION: Right lung base atelectatic changes versus developing pneumonia. Clinical correlation is recommended. Electronically Signed   By: Elgie Collard M.D.   On: 10/24/2015 00:55   I have personally reviewed and evaluated these images and lab results as part of my medical decision-making.   EKG Interpretation   Date/Time:  Tuesday October 24 2015 00:03:12 EDT Ventricular Rate:  96 PR Interval:  185 QRS Duration: 82 QT Interval:  355 QTC Calculation: 449 R Axis:   -31 Text Interpretation:  Sinus rhythm Left axis deviation Abnormal R-wave  progression, early transition Abnormal T, consider ischemia, lateral leads  Baseline wander in lead(s) V2 No significant change since last tracing  Confirmed by Alfonza Toft  MD, Merrin Mcvicker [phone]) on 10/24/2015 12:08:03 AM      MDM   Final diagnoses:  CAP (community acquired pneumonia)   Presents to the emergency department for evaluation of shortness of breath. Patient reportedly has a history of asthma, but does not have any current asthma treatments at home. She reportedly has not had problems with her breathing for some time. She does have a long smoking history as well. Over the last week or so she has had progressively worsening cough, shortness of breath, especially with exertion. She denies chest pain associated with symptoms. Examination revealed significantly diminished breath sounds bilaterally. She  was treated with DuoNeb by EMS but only had minimal improvement. Patient placed on continuous nebulizer treatment here in the ER and administered Solu-Medrol. Chest x-ray suspicious for early community-acquired pneumonia. Patient administered Rocephin and Zithromax, will require hospitalization for further management.  I personally performed the services described in this documentation, which was scribed in my presence. The recorded information has been reviewed and is accurate.   Gilda Crease, MD 10/24/15 [phone]

## 2015-10-24 ENCOUNTER — Encounter (HOSPITAL_COMMUNITY): Payer: Self-pay | Admitting: Internal Medicine

## 2015-10-24 ENCOUNTER — Emergency Department (HOSPITAL_COMMUNITY): Payer: Medicare (Managed Care)

## 2015-10-24 DIAGNOSIS — J440 Chronic obstructive pulmonary disease with acute lower respiratory infection: Secondary | ICD-10-CM | POA: Diagnosis present

## 2015-10-24 DIAGNOSIS — Z87891 Personal history of nicotine dependence: Secondary | ICD-10-CM

## 2015-10-24 DIAGNOSIS — Z79899 Other long term (current) drug therapy: Secondary | ICD-10-CM | POA: Diagnosis not present

## 2015-10-24 DIAGNOSIS — Z880 Allergy status to penicillin: Secondary | ICD-10-CM | POA: Diagnosis not present

## 2015-10-24 DIAGNOSIS — E785 Hyperlipidemia, unspecified: Secondary | ICD-10-CM

## 2015-10-24 DIAGNOSIS — J189 Pneumonia, unspecified organism: Secondary | ICD-10-CM | POA: Diagnosis present

## 2015-10-24 DIAGNOSIS — F0390 Unspecified dementia without behavioral disturbance: Secondary | ICD-10-CM | POA: Diagnosis present

## 2015-10-24 DIAGNOSIS — I10 Essential (primary) hypertension: Secondary | ICD-10-CM | POA: Diagnosis present

## 2015-10-24 DIAGNOSIS — Z23 Encounter for immunization: Secondary | ICD-10-CM | POA: Diagnosis not present

## 2015-10-24 DIAGNOSIS — J441 Chronic obstructive pulmonary disease with (acute) exacerbation: Secondary | ICD-10-CM | POA: Diagnosis present

## 2015-10-24 DIAGNOSIS — F17210 Nicotine dependence, cigarettes, uncomplicated: Secondary | ICD-10-CM | POA: Diagnosis not present

## 2015-10-24 LAB — I-STAT ARTERIAL BLOOD GAS, ED
Acid-base deficit: 2 mmol/L (ref 0.0–2.0)
BICARBONATE: 22.6 meq/L (ref 20.0–24.0)
O2 Saturation: 91 %
PCO2 ART: 36.8 mmHg (ref 35.0–45.0)
PO2 ART: 62 mmHg — AB (ref 80.0–100.0)
TCO2: 24 mmol/L (ref 0–100)
pH, Arterial: 7.395 (ref 7.350–7.450)

## 2015-10-24 LAB — TROPONIN I: Troponin I: <0.03 ng/mL (ref <0.031)

## 2015-10-24 LAB — CBC WITH DIFFERENTIAL/PLATELET
BASOS ABS: 0 10*3/uL (ref 0.0–0.1)
Basophils Relative: 0 %
Eosinophils Absolute: 0 10*3/uL (ref 0.0–0.7)
Eosinophils Relative: 0 %
HCT: 41.5 % (ref 36.0–46.0)
HEMOGLOBIN: 13.5 g/dL (ref 12.0–15.0)
LYMPHS ABS: 1 10*3/uL (ref 0.7–4.0)
LYMPHS PCT: 6 %
MCH: 28.9 pg (ref 26.0–34.0)
MCHC: 32.5 g/dL (ref 30.0–36.0)
MCV: 88.9 fL (ref 78.0–100.0)
Monocytes Absolute: 0.6 10*3/uL (ref 0.1–1.0)
Monocytes Relative: 4 %
NEUTROS ABS: 15.6 10*3/uL — AB (ref 1.7–7.7)
NEUTROS PCT: 90 %
Platelets: 208 10*3/uL (ref 150–400)
RBC: 4.67 MIL/uL (ref 3.87–5.11)
RDW: 13.8 % (ref 11.5–15.5)
WBC: 17.3 10*3/uL — AB (ref 4.0–10.5)

## 2015-10-24 LAB — BASIC METABOLIC PANEL
Anion gap: 9 (ref 5–15)
BUN: 17 mg/dL (ref 6–20)
CHLORIDE: 104 mmol/L (ref 101–111)
CO2: 26 mmol/L (ref 22–32)
CREATININE: 1.16 mg/dL — AB (ref 0.44–1.00)
Calcium: 9.1 mg/dL (ref 8.9–10.3)
GFR calc Af Amer: 51 mL/min — ABNORMAL LOW (ref >60)
GFR calc non Af Amer: 44 mL/min — ABNORMAL LOW (ref >60)
GLUCOSE: 194 mg/dL — AB (ref 65–99)
Potassium: 4.1 mmol/L (ref 3.5–5.1)
Sodium: 139 mmol/L (ref 135–145)

## 2015-10-24 LAB — BRAIN NATRIURETIC PEPTIDE: B NATRIURETIC PEPTIDE 5: 103.3 pg/mL — AB (ref 0.0–100.0)

## 2015-10-24 LAB — I-STAT CG4 LACTIC ACID, ED: Lactic Acid, Venous: 2.75 mmol/L (ref 0.5–2.0)

## 2015-10-24 MED ORDER — CARVEDILOL 25 MG PO TABS
25.0000 mg | ORAL_TABLET | Freq: Two times a day (BID) | ORAL | Status: DC
Start: 2015-10-24 — End: 2015-10-25
  Administered 2015-10-24 – 2015-10-25 (×3): 25 mg via ORAL
  Filled 2015-10-24 (×3): qty 1

## 2015-10-24 MED ORDER — LISINOPRIL 20 MG PO TABS
20.0000 mg | ORAL_TABLET | Freq: Every day | ORAL | Status: DC
  Administered 2015-10-24 – 2015-10-25 (×2): 20 mg via ORAL
  Filled 2015-10-24 (×2): qty 1

## 2015-10-24 MED ORDER — ALBUTEROL (5 MG/ML) CONTINUOUS INHALATION SOLN
10.0000 mg/h | INHALATION_SOLUTION | Freq: Once | RESPIRATORY_TRACT | Status: AC
  Administered 2015-10-24: 10 mg/h via RESPIRATORY_TRACT
  Filled 2015-10-24: qty 20

## 2015-10-24 MED ORDER — PREDNISONE 20 MG PO TABS
40.0000 mg | ORAL_TABLET | Freq: Every day | ORAL | Status: DC
  Administered 2015-10-25: 40 mg via ORAL
  Filled 2015-10-24: qty 2

## 2015-10-24 MED ORDER — HYDROCHLOROTHIAZIDE 25 MG PO TABS
25.0000 mg | ORAL_TABLET | Freq: Every day | ORAL | Status: DC
  Administered 2015-10-24 – 2015-10-25 (×2): 25 mg via ORAL
  Filled 2015-10-24 (×2): qty 1

## 2015-10-24 MED ORDER — DEXTROSE 5 % IV SOLN
1.0000 g | INTRAVENOUS | Status: DC
  Administered 2015-10-24 (×2): 1 g via INTRAVENOUS
  Filled 2015-10-24 (×3): qty 10

## 2015-10-24 MED ORDER — GUAIFENESIN ER 600 MG PO TB12
600.0000 mg | ORAL_TABLET | Freq: Two times a day (BID) | ORAL | Status: DC
Start: 2015-10-24 — End: 2015-10-25
  Administered 2015-10-24 – 2015-10-25 (×3): 600 mg via ORAL
  Filled 2015-10-24 (×3): qty 1

## 2015-10-24 MED ORDER — AMLODIPINE BESYLATE 10 MG PO TABS
10.0000 mg | ORAL_TABLET | Freq: Every day | ORAL | Status: DC
  Administered 2015-10-24 – 2015-10-25 (×2): 10 mg via ORAL
  Filled 2015-10-24 (×2): qty 1

## 2015-10-24 MED ORDER — ENOXAPARIN SODIUM 40 MG/0.4ML ~~LOC~~ SOLN
40.0000 mg | SUBCUTANEOUS | Status: DC
  Administered 2015-10-24 – 2015-10-25 (×2): 40 mg via SUBCUTANEOUS
  Filled 2015-10-24 (×2): qty 0.4

## 2015-10-24 MED ORDER — IPRATROPIUM-ALBUTEROL 0.5-2.5 (3) MG/3ML IN SOLN: 3.0000 mL | RESPIRATORY_TRACT | Status: DC | PRN

## 2015-10-24 MED ORDER — CHOLECALCIFEROL 10 MCG (400 UNIT) PO TABS
400.0000 [IU] | ORAL_TABLET | Freq: Every day | ORAL | Status: DC
  Administered 2015-10-24 – 2015-10-25 (×2): 400 [IU] via ORAL
  Filled 2015-10-24 (×2): qty 1

## 2015-10-24 MED ORDER — PRAVASTATIN SODIUM 40 MG PO TABS
40.0000 mg | ORAL_TABLET | ORAL | Status: DC
  Administered 2015-10-24: 40 mg via ORAL
  Filled 2015-10-24: qty 1

## 2015-10-24 MED ORDER — LISINOPRIL-HYDROCHLOROTHIAZIDE 20-25 MG PO TABS: 1.0000 | ORAL_TABLET | Freq: Every day | ORAL | Status: DC

## 2015-10-24 MED ORDER — DEXTROSE 5 % IV SOLN
500.0000 mg | Freq: Once | INTRAVENOUS | Status: AC
  Administered 2015-10-24: 500 mg via INTRAVENOUS
  Filled 2015-10-24: qty 500

## 2015-10-24 MED ORDER — PNEUMOCOCCAL VAC POLYVALENT 25 MCG/0.5ML IJ INJ
0.5000 mL | INJECTION | INTRAMUSCULAR | Status: AC
  Administered 2015-10-25: 0.5 mL via INTRAMUSCULAR
  Filled 2015-10-24: qty 0.5

## 2015-10-24 MED ORDER — METHYLPREDNISOLONE SODIUM SUCC 125 MG IJ SOLR
125.0000 mg | Freq: Once | INTRAMUSCULAR | Status: AC
Start: 2015-10-24 — End: 2015-10-24
  Administered 2015-10-24: 125 mg via INTRAVENOUS
  Filled 2015-10-24: qty 2

## 2015-10-24 MED ORDER — AZITHROMYCIN 500 MG PO TABS
250.0000 mg | ORAL_TABLET | Freq: Every day | ORAL | Status: DC
  Administered 2015-10-25: 250 mg via ORAL
  Filled 2015-10-24: qty 1

## 2015-10-24 NOTE — H&P (Signed)
Date: 10/24/2015               Patient Name:  Regina Sandoval MRN: [id number]  DOB: [birth year] Age / Sex: 75 y.o., female   PCP: Jethro Bastosobert N Koehler, MD         Medical Service: Internal Medicine Teaching Service         Attending Physician: Dr. Gilda Creasehristopher J Pollina, MD    First Contact: Dr. Darreld McleanVishal Patel Pager: [phone]  Second Contact: Dr. Heywood Ilesushil Patel Pager: [phone]       After Hours (After 5p/  First Contact Pager: [phone]  weekends / holidays): Second Contact Pager: [phone]   Chief Complaint: "I've been coughing and feeling like I can't catch my breath."  History of Present Illness:  Regina Sandoval is a 75 year old lady with hypertension, former 50 pack-year smoking history, and hyperlipidemia here with dyspnea and productive cough.  One week ago, she felt she had a head cold. She has since had an increasingly productive cough. This morning, she felt very short of breath and she was wheezing even when walking about 5 feet, so her granddaughter decided to bring her into the hospital. She felt chilled but denies any objective fevers. She further denies any chest pain, hemoptysis, lower leg swelling, or orthopnea; review of systems was otherwise non-revealing.  Meds: Current Facility-Administered Medications  Medication Dose Route Frequency Provider Last Rate Last Dose  . azithromycin (ZITHROMAX) 500 mg in dextrose 5 % 250 mL IVPB  500 mg Intravenous Once Gilda Creasehristopher J Pollina, MD      . cefTRIAXone (ROCEPHIN) 1 g in dextrose 5 % 50 mL IVPB  1 g Intravenous Q24H Gilda Creasehristopher J Pollina, MD 100 mL/hr at 10/24/15 0142 1 g at 10/24/15 0142   Current Outpatient Prescriptions  Medication Sig Dispense Refill  . acetaminophen (TYLENOL) 500 MG tablet Take 500 mg by mouth every 6 (six) hours as needed (Arthritic pain).    Marland Kitchen. amLODipine (NORVASC) 10 MG tablet Take 1 tablet (10 mg total) by mouth daily. 30 tablet 0  . carvedilol (COREG) 25 MG tablet Take 25 mg by mouth 2 (two) times daily with a meal.     . Cholecalciferol (VITAMIN D PO) Take 1 tablet by mouth daily.    Marland Kitchen. lisinopril-hydrochlorothiazide (PRINZIDE,ZESTORETIC) 20-25 MG per tablet Take 1 tablet by mouth daily. 30 tablet 0  . pravastatin (PRAVACHOL) 40 MG tablet Take 1 tablet (40 mg total) by mouth 1 day or 1 dose. 30 tablet 0    Allergies: Allergies as of 10/23/2015 - Review Complete 10/23/2015  Allergen Reaction Noted  . Penicillins  05/15/2010   Past Medical History  Diagnosis Date  . Arthritis   . Hypertension   . Dementia   . Hyperlipidemia   . Seizures (HCC)    History reviewed. No pertinent past surgical history. No family history on file. Social History   Social History  . Marital Status: Divorced    Spouse Name: N/A  . Number of Children: N/A  . Years of Education: N/A   Occupational History  . Not on file.   Social History Main Topics  . Smoking status: Former Smoker -- 1.00 packs/day for 50 years    Types: Cigarettes  . Smokeless tobacco: Not on file  . Alcohol Use: Not on file  . Drug Use: Not on file  . Sexual Activity: Not on file   Other Topics Concern  . Not on file   Social History Narrative   Review of Systems:  Per HPI  Physical Exam: Blood pressure 148/76, pulse 94, temperature 99.6 F (37.6 C), temperature source Oral, resp. rate 18, SpO2 100 %. General: very friendly elderly lady resting in bed comfortably, appropriately conversational, joking around HEENT: no scleral icterus, extra-ocular muscles intact, oropharynx without lesions Cardiac: regular rate and rhythm, with a 2/6 systolic murmur loudest at the right upper sternal border, no other murmurs Pulm: breathing well, notable decreased breath sounds, without appreciable wheezing or focal ronchi Abd: bowel sounds normal, soft, nondistended, non-tender Ext: warm and well perfused, without pedal edema Lymph: no cervical or supraclavicular lymphadenopathy Skin: no rash, hair, or nail changes Neuro: alert and oriented X3,  cranial nerves II-XII grossly intact, moving all extremities well  Lab results: Basic Metabolic Panel:  Recent Labs  16/10/96 0113  NA 139  K 4.1  CL 104  CO2 26  GLUCOSE 194*  BUN 17  CREATININE 1.16*  CALCIUM 9.1   CBC:  Recent Labs  10/24/15 0113  WBC 17.3*  NEUTROABS 15.6*  HGB 13.5  HCT 41.5  MCV 88.9  PLT 208   Cardiac Enzymes:  Recent Labs  10/24/15 0113  TROPONINI <0.03    Imaging results:  Dg Chest 2 View  10/24/2015  CLINICAL DATA:  75 year old female with chest pain EXAM: CHEST  2 VIEW COMPARISON:  Chest radiograph dated 07/24/2013 FINDINGS: Two views of the chest demonstrate an area of increased density at the right lung base which may represent atelectatic changes or developing pneumonia. The left lung is clear. There is no pleural effusion or pneumothorax. The cardiac silhouette is within normal limits. The aorta is tortuous. There is osteopenia. No acute fracture. IMPRESSION: Right lung base atelectatic changes versus developing pneumonia. Clinical correlation is recommended. Electronically Signed   By: Elgie Collard M.D.   On: 10/24/2015 00:55    Other results: EKG: normal sinus rhythm, lateral T wave inversions unchanged from an EKG in 2015, no other ischemic changes.  Assessment & Plan by Problem:  Regina Sandoval is a 75 year old lady with hypertension, a 50 pack-year smoking history, and hyperlipidemia here with dyspnea and productive cough, a new oxygen requirement, a basilar consolidation on CXR, and leukocytosis, concerning for community-acquired pneumonia.  Community-acquired pneumonia atop COPD exacerbation: Per above. She also has markedly decreased lung sounds and I suspect she has undiagnosed COPD, so we will also treat with Solumedrol and scheduled DuoNebs to help decrease her breathing. We will treat with ceftriaxone and azithromycin and likely transition to orals in the next couple days. -Started IV ceftriaxone and azithromycin -Gave IV  Solumedrol  once; can transition to oral prednisone tomorrow depending on her breathing -Started DuoNebs every 4 hours as needed for shortness of breath -Started guaifenesin  twice daily -Can order outpatient pulmonary function tests to characterize her COPD upon discharge  Hypertension: She is normotensive. -Continue lisinopril  daily -Continue HCTZ  daily -Continue amlodipine  daily -Continue carvedilol  twice daily   Hyperlipidemia: Last LDL was 179 in 2011. -Continue pravastatin  daily  Dispo: Disposition is deferred at this time, awaiting improvement of current medical problems. Anticipated discharge in approximately 1-3 day(s).   The patient does have a current PCP Jethro Bastos, MD) and does need an [facility] hospital follow-up appointment after discharge.  The patient does have transportation limitations that hinder transportation to clinic appointments.  Signed: Selina Cooley, MD 10/24/2015, 2:17 AM

## 2015-10-24 NOTE — Care Management Obs Status (Signed)
MEDICARE OBSERVATION STATUS NOTIFICATION   Patient Details  Name: Regina Sandoval MRN: [id number] Date of Birth: 09/15/1940   Medicare Observation Status Notification Given:  Yes    Lawerance Sabalebbie Jowell Bossi, RN 10/24/2015, 1:26 PM

## 2015-10-24 NOTE — Care Management Note (Signed)
Case Management Note  Patient Details  Name: Leonette Nuttinglma Filo MRN: [id number] Date of Birth: Sep 29, 1940  Subjective/Objective:                 Spoke with patient in room, she [facility]. She lives at home with daughter, she uses a walker. Spoke with Irving Burtonmily SW at [facility] [phone]. If patient requires transport home, this can be arranged through [facility] by Connelly SpringsEmily SW.    Action/Plan:  CM will continue to follow for DC planning.   Expected Discharge Date:  10/24/15               Expected Discharge Plan:  Home/Self Care ([facility])  In-House Referral:  NA  Discharge planning Services  CM Consult  Post Acute Care Choice:  NA Choice offered to:     DME Arranged:    DME Agency:     HH Arranged:    HH Agency:     Status of Service:  Completed, signed off  Medicare Important Message Given:    Date Medicare IM Given:    Medicare IM give by:    Date Additional Medicare IM Given:    Additional Medicare Important Message give by:     If discussed at Long Length of Stay Meetings, dates discussed:    Additional Comments:  Lawerance SabalDebbie Vianka Ertel, RN 10/24/2015, 2:05 PM

## 2015-10-24 NOTE — ED Notes (Signed)
Admitting MD at bedside.

## 2015-10-24 NOTE — Progress Notes (Signed)
   Subjective: No acute events overnight. Patient feels much better this morning without complaints or questions. Objective: Vital signs in last 24 hours: Filed Vitals:   10/24/15 0410 10/24/15 0745 10/24/15 0947 10/24/15 1349  BP: 134/53 140/58 128/61 110/89  Pulse: 91 93 90 87  Temp: 99.3 F (37.4 C)   98.8 F (37.1 C)  TempSrc: Oral     Resp: 18  20 18   Height: 5' 10.8" (1.798 m)     Weight: 221 lb 14.4 oz (100.653 kg)     SpO2: 97%  96% 95%   Weight change:   Intake/Output Summary (Last 24 hours) at 10/24/15 1531 Last data filed at 10/24/15 0219  Gross per 24 hour  Intake     50 ml  Output      0 ml  Net     50 ml   General: resting in bed, no acute distress Cardiac: RRR, no rubs, murmurs or gallops Pulm: distant breath sounds without noticeable wheezing or rhonchi Abd: soft, nontender, nondistended Ext: warm and well perfused, no pedal edema Neuro: alert and oriented X3  Assessment/Plan: Active Problems:   CAP (community acquired pneumonia)   CAP with suspected COPD exacerbation: Patient with RLL consolidation on CXR with elevated white count of 17k suggestive of CAP. She was started on ceftriaxone and azithromycin. She also has suspected undiagnosed COPD which was treated with Solumedrol and scheduled DuoNebs.  -Continue IV ceftriaxone, likely transition to PO tomorrow -Azithromycin 250 mg po daily for 4 days beginning tomorrow -Received IV Solumedrol 125mg  once today -Start oral Prednisone 40 mg daily for 4 days tomorrow -DuoNebs every 4 hours as needed for shortness of breath -Continue guaifenesin 600mg  twice daily -Outpatient PFTs to characterize her COPD upon discharge  Hypertension: She is normotensive. -Continue lisinopril 20mg  daily -Continue HCTZ 25mg  daily -Continue amlodipine 10mg  daily -Continue carvedilol 25mg  twice daily   Hyperlipidemia: Last LDL was 179 in 2011. -Continue pravastatin 40mg  daily  Dispo: Disposition is deferred at this  time, awaiting improvement of current medical problems.  Anticipated discharge in approximately 1-2 day(s).   The patient does have a current PCP Jethro Bastos(Robert N Koehler, MD) and [facility] after discharge.  The patient does not have transportation limitations that hinder transportation to clinic appointments.      Darreld McleanVishal Avilyn Virtue, MD 10/24/2015, 3:31 PM

## 2015-10-24 NOTE — Progress Notes (Signed)
Started patient on continuous nebulizer to deliver 10mg/hr of albuterol 

## 2015-10-24 NOTE — Progress Notes (Signed)
Patient arrived to floor from ED on stretcher, accompanied by granddaughter.  Alert and oriented, patient is being admitted for pneumonia.  She is on 2L 02 via nasal cannula. She has expiratory wheezes bilaterally.  Oriented to room. Safety maintained.

## 2015-10-25 DIAGNOSIS — J440 Chronic obstructive pulmonary disease with acute lower respiratory infection: Secondary | ICD-10-CM | POA: Diagnosis not present

## 2015-10-25 DIAGNOSIS — J441 Chronic obstructive pulmonary disease with (acute) exacerbation: Secondary | ICD-10-CM | POA: Insufficient documentation

## 2015-10-25 LAB — CBC
HCT: 37.4 % (ref 36.0–46.0)
HEMOGLOBIN: 11.9 g/dL — AB (ref 12.0–15.0)
MCH: 28 pg (ref 26.0–34.0)
MCHC: 31.8 g/dL (ref 30.0–36.0)
MCV: 88 fL (ref 78.0–100.0)
PLATELETS: 218 10*3/uL (ref 150–400)
RBC: 4.25 MIL/uL (ref 3.87–5.11)
RDW: 14.1 % (ref 11.5–15.5)
WBC: 26.3 10*3/uL — ABNORMAL HIGH (ref 4.0–10.5)

## 2015-10-25 MED ORDER — CEFUROXIME AXETIL 500 MG PO TABS: 500.0000 mg | ORAL_TABLET | Freq: Two times a day (BID) | ORAL | Status: DC

## 2015-10-25 MED ORDER — CEFUROXIME AXETIL 500 MG PO TABS
500.0000 mg | ORAL_TABLET | Freq: Two times a day (BID) | ORAL | Status: DC
  Administered 2015-10-25: 500 mg via ORAL
  Filled 2015-10-25 (×2): qty 1

## 2015-10-25 MED ORDER — CEFUROXIME AXETIL 500 MG PO TABS: 500.0000 mg | ORAL_TABLET | Freq: Two times a day (BID) | ORAL | Status: AC

## 2015-10-25 MED ORDER — GUAIFENESIN ER 600 MG PO TB12: 600.0000 mg | ORAL_TABLET | Freq: Two times a day (BID) | ORAL | Status: DC | PRN

## 2015-10-25 MED ORDER — AZITHROMYCIN 250 MG PO TABS: 250.0000 mg | ORAL_TABLET | Freq: Every day | ORAL | Status: DC

## 2015-10-25 MED ORDER — PREDNISONE 20 MG PO TABS: 40.0000 mg | ORAL_TABLET | Freq: Every day | ORAL | Status: AC

## 2015-10-25 NOTE — Progress Notes (Signed)
[facility] with with [facility]ily at [facility]. Transportation will pick up patient around 2-2:30. Irving Burtonmily stated that [facility] or their MD can rewrite Rx to be filled at the [facility] they usually use. CM offered further assistance with Rx, Irving Burtonmily stated she will call back if needed. Bedside nurse aware of pick up time by [facility].

## 2015-10-25 NOTE — Progress Notes (Signed)
   Subjective: Patient feels at her baseline today. She denies further cough, fevers, SOB, or any pain. She was able to walk without issue. She is afebrile, without tachycardia, tachypnea, hypoxia, or altered mental status. Her WBC increased from yesterday after receiving steroids. She is stable for discharge to home today. Objective: Vital signs in last 24 hours: Filed Vitals:   10/24/15 1349 10/24/15 1641 10/24/15 2059 10/25/15 0547  BP: 110/89 139/63 149/63 136/64  Pulse: 87 84 89 91  Temp: 98.8 F (37.1 C)  98.4 F (36.9 C) 98.2 F (36.8 C)  TempSrc:   Oral Oral  Resp: 18 20 16 20   Height:      Weight:      SpO2: 95% 98% 96% 92%   Weight change:   Intake/Output Summary (Last 24 hours) at 10/25/15 1137 Last data filed at 10/25/15 0941  Gross per 24 hour  Intake    880 ml  Output      0 ml  Net    880 ml   General: resting in bed, no acute distress Cardiac: RRR, no rubs, murmurs or gallops Pulm: distant breath sounds without noticeable wheezing or rhonchi Abd: soft, nontender, nondistended Ext: warm and well perfused, no pedal edema Neuro: alert and oriented X3  Assessment/Plan: Active Problems:   CAP (community acquired pneumonia)   CAP with suspected COPD exacerbation: Patient with RLL consolidation on CXR with elevated white count of 17k suggestive of CAP. She was started on ceftriaxone and azithromycin. She also has suspected undiagnosed COPD which was treated with Solumedrol and scheduled DuoNebs. Transitioning to oral antibiotics and steroids to complete total 5 day course at home. -Transition to PO Ceftin 500 mg BID today to complete 5 day total abx course -Azithromycin 250 mg po daily to complete 5 day total abx course -oral Prednisone 40 mg daily to complete 5 day total course -DuoNebs every 4 hours as needed for shortness of breath -Continue guaifenesin 600mg  twice daily -Consider outpatient PFTs to characterize her COPD upon discharge  Hypertension: She is  normotensive. -Continue lisinopril 20mg  daily -Continue HCTZ 25mg  daily -Continue amlodipine 10mg  daily -Continue carvedilol 25mg  twice daily   Hyperlipidemia: Last LDL was 179 in 2011. -Continue pravastatin 40mg  daily  Dispo:  Anticipated discharge today.  The patient does have a current PCP Jethro Bastos(Robert N Koehler, MD) and [facility] not [facility] after discharge.  The patient does not have transportation limitations that hinder transportation to clinic appointments.    LOS: 1 day   Darreld McleanVishal Andreas Sobolewski, MD 10/25/2015, 11:37 AM

## 2015-10-25 NOTE — Progress Notes (Signed)
D/c instructions reviewed with pt, copy of instructions given to pt. (pt scripts sent in electronically by MD to pt's pharmacy). Pt waiting for [facility] transportation to pick pt up.

## 2015-10-25 NOTE — Discharge Instructions (Signed)
Please complete the antibiotics and prednisone as prescribed. Please follow up with Dr. Dorothe PeaKoehler on 10/27/15.     Community-Acquired Pneumonia, Adult Pneumonia is an infection of the lungs. One type of pneumonia can happen while a person is in a hospital. A different type can happen when a person is not in a hospital (community-acquired pneumonia). It is easy for this kind to spread from person to person. It can spread to you if you breathe near an infected person who coughs or sneezes. Some symptoms include:  A dry cough.  A wet (productive) cough.  Fever.  Sweating.  Chest pain. HOME CARE  Take over-the-counter and prescription medicines only as told by your doctor.  Only take cough medicine if you are losing sleep.  If you were prescribed an antibiotic medicine, take it as told by your doctor. Do not stop taking the antibiotic even if you start to feel better.  Sleep with your head and neck raised (elevated). You can do this by putting a few pillows under your head, or you can sleep in a recliner.  Do not use tobacco products. These include cigarettes, chewing tobacco, and e-cigarettes. If you need help quitting, ask your doctor.  Drink enough water to keep your pee (urine) clear or pale yellow. A shot (vaccine) can help prevent pneumonia. Shots are often suggested for:  People older than 77 years of age.  People older than 77 years of age:  Who are having cancer treatment.  Who have long-term (chronic) lung disease.  Who have problems with their body's defense system (immune system). You may also prevent pneumonia if you take these actions:  Get the flu (influenza) shot every year.  Go to the dentist as often as told.  Wash your hands often. If soap and water are not available, use hand sanitizer. GET HELP IF:  You have a fever.  You lose sleep because your cough medicine does not help. GET HELP RIGHT AWAY IF:  You are short of breath and it gets worse.  You  have more chest pain.  Your sickness gets worse. This is very serious if:  You are an older adult.  Your body's defense system is weak.  You cough up blood.   This information is not intended to replace advice given to you by your health care provider. Make sure you discuss any questions you have with your health care provider.   Document Released: 10/23/2007 Document Revised: 01/25/2015 Document Reviewed: 08/31/2014 Elsevier Interactive Patient Education [employer]2016 Elsevier Inc.

## 2015-10-25 NOTE — Discharge Summary (Signed)
Name: Regina Sandoval MRN: [id number] DOB: 1940/10/15 75 y.o. PCP: Jethro Bastos, MD  Date of Admission: 10/23/2015 11:42 PM Date of Discharge: 10/25/2015 Attending Physician: Levert Feinstein, MD  Discharge Diagnosis: 1. CAP 2. HTN   Active Problems:   CAP (community acquired pneumonia)   Essential hypertension, benign   COPD exacerbation (HCC)  Discharge Medications:   Medication List    TAKE these medications        acetaminophen 500 MG tablet  Commonly known as:  TYLENOL  Take 500 mg by mouth every 6 (six) hours as needed (Arthritic pain).     allopurinol 100 MG tablet  Commonly known as:  ZYLOPRIM  Take 100 mg by mouth daily.     amLODipine 10 MG tablet  Commonly known as:  NORVASC  Take 1 tablet (10 mg total) by mouth daily.     aspirin 81 MG tablet  Take 81 mg by mouth daily.     atorvastatin 40 MG tablet  Commonly known as:  LIPITOR  Take 40 mg by mouth daily.     azithromycin 250 MG tablet  Commonly known as:  ZITHROMAX  Take 1 tablet (250 mg total) by mouth daily.     carvedilol 80 MG 24 hr capsule  Commonly known as:  COREG CR  Take 80 mg by mouth daily.     cefUROXime 500 MG tablet  Commonly known as:  CEFTIN  Take 1 tablet (500 mg total) by mouth 2 (two) times daily with a meal.     cloNIDine 0.1 mg/24hr patch  Commonly known as:  CATAPRES - Dosed in mg/24 hr  Place 0.1 mg onto the skin once a week.     colchicine 0.6 MG tablet  Take 0.6 mg by mouth daily.     donepezil 10 MG tablet  Commonly known as:  ARICEPT  Take 10 mg by mouth at bedtime.     fluticasone 50 MCG/ACT nasal spray  Commonly known as:  FLONASE  Place 1 spray into both nostrils daily.     guaiFENesin 600 MG 12 hr tablet  Commonly known as:  MUCINEX  Take 1 tablet (600 mg total) by mouth 2 (two) times daily as needed for cough or to loosen phlegm.     predniSONE 20 MG tablet  Commonly known as:  DELTASONE  Take 2 tablets (40 mg total) by mouth daily with  breakfast.     VITAMIN D PO  Take 1 tablet by mouth daily.        Disposition and follow-up:   Regina Sandoval was discharged from [facility] in Good condition.  At the hospital follow up visit please address:  1.  Completion of antibiotics, BP control, Consider PFTs to characterize possible obstructive airway disease  2.  Labs / imaging needed at time of follow-up: CXR 4-6 weeks post discharge  3.  Pending labs/ test needing follow-up: none  Follow-up Appointments: Follow-up Information    Follow up with Thane Edu, MD On 10/27/2015.   Specialty:  Family Medicine   Why:  hospital f/u   Contact information:   [address] [place] [address] [phone]       Discharge Instructions: Discharge Instructions    Call MD for:  difficulty breathing, headache or visual disturbances    Complete by:  As directed      Call MD for:  extreme fatigue    Complete by:  As directed  Call MD for:  hives    Complete by:  As directed      Call MD for:  persistant dizziness or light-headedness    Complete by:  As directed      Call MD for:  persistant nausea and vomiting    Complete by:  As directed      Call MD for:  severe uncontrolled pain    Complete by:  As directed      Call MD for:  temperature >100.4    Complete by:  As directed      Diet - low sodium heart healthy    Complete by:  As directed      Increase activity slowly    Complete by:  As directed            Consultations:    Procedures Performed:  Dg Chest 2 View  10/24/2015  CLINICAL DATA:  75 year old female with chest pain EXAM: CHEST  2 VIEW COMPARISON:  Chest radiograph dated 07/24/2013 FINDINGS: Two views of the chest demonstrate an area of increased density at the right lung base which may represent atelectatic changes or developing pneumonia. The left lung is clear. There is no pleural effusion or pneumothorax. The cardiac silhouette is within normal limits. The aorta is  tortuous. There is osteopenia. No acute fracture. IMPRESSION: Right lung base atelectatic changes versus developing pneumonia. Clinical correlation is recommended. Electronically Signed   By: Elgie CollardArash  Radparvar M.D.   On: 10/24/2015 00:55    2D Echo:   Cardiac Cath:   Admission HPI: Regina Sandoval is a 75 year old lady with hypertension, former 50 pack-year smoking history, and hyperlipidemia here with dyspnea and productive cough.  One week ago, she felt she had a head cold. She has since had an increasingly productive cough. This morning, she felt very short of breath and she was wheezing even when walking about 5 feet, so her granddaughter decided to bring her into the hospital. She felt chilled but denies any objective fevers. She further denies any chest pain, hemoptysis, lower leg swelling, or orthopnea; review of systems was otherwise non-revealing.  Hospital Course by problem list: Active Problems:   CAP (community acquired pneumonia)   Essential hypertension, benign   COPD exacerbation (HCC)   CAP: Patient presented with dyspnea and CXR with RLL infiltrate. She was started on CTX and Azithromycin.  She remained afebrile without hypoxemia during admission. She was transitioned on discharge to Cefuroxime and Azithromycin for 5 day total antibiotic course (last dose 6/10).  Questionable COPD: Patient with decreased breath sounds over both lung fields with suspected exacerbation of underlying obstructive airway disease. She was given IV solumedrol and transitioned to oral prednisone to complete a 5 day course (last dose 6/10). Steroid administration contributed to increase in white count to 26,000. She was otherwise afebrile, normotensive, and oxygenating well on room air prior to discharge.  Discharge Vitals:   BP 149/78 mmHg  Pulse 91  Temp(Src) 98.2 F (36.8 C) (Oral)  Resp 20  Ht 5' 10.8" (1.798 m)  Wt 221 lb 14.4 oz (100.653 kg)  BMI 31.13 kg/m2  SpO2 92%  Discharge Labs:  No  results found for this or any previous visit (from the past 24 hour(s)).  Signed: Darreld McleanVishal Patel, MD 10/26/2015, 3:17 PM    Services Ordered on Discharge: none Equipment Ordered on Discharge: none

## 2015-10-25 NOTE — Progress Notes (Signed)
Pt d/c'd via wheelchair with [facility] transportation, with her belongings, a family member here and taking some of her belongings as well as pt directed them too.

## 2015-11-06 ENCOUNTER — Other Ambulatory Visit: Payer: Self-pay | Admitting: Nurse Practitioner

## 2015-11-06 ENCOUNTER — Ambulatory Visit
Admission: RE | Admit: 2015-11-06 | Discharge: 2015-11-06 | Disposition: A | Discharge status: Home / Self Care | Payer: Medicare (Managed Care) | Source: Ambulatory Visit | Attending: Nurse Practitioner | Admitting: Nurse Practitioner

## 2015-11-06 DIAGNOSIS — R062 Wheezing: Secondary | ICD-10-CM

## 2015-11-08 ENCOUNTER — Other Ambulatory Visit (HOSPITAL_COMMUNITY): Payer: Self-pay | Admitting: Family Medicine

## 2015-11-08 DIAGNOSIS — T671XXD Heat syncope, subsequent encounter: Secondary | ICD-10-CM

## 2015-11-10 ENCOUNTER — Ambulatory Visit (HOSPITAL_COMMUNITY)
Admission: RE | Admit: 2015-11-10 | Discharge: 2015-11-10 | Disposition: A | Discharge status: Home / Self Care | Payer: Medicare (Managed Care) | Source: Ambulatory Visit | Attending: Internal Medicine | Admitting: Internal Medicine

## 2015-11-10 DIAGNOSIS — Z720 Tobacco use: Secondary | ICD-10-CM | POA: Insufficient documentation

## 2015-11-10 DIAGNOSIS — R55 Syncope and collapse: Secondary | ICD-10-CM

## 2015-11-10 DIAGNOSIS — I119 Hypertensive heart disease without heart failure: Secondary | ICD-10-CM | POA: Diagnosis not present

## 2015-11-10 DIAGNOSIS — T671XXD Heat syncope, subsequent encounter: Secondary | ICD-10-CM | POA: Diagnosis not present

## 2015-11-10 DIAGNOSIS — X58XXXD Exposure to other specified factors, subsequent encounter: Secondary | ICD-10-CM | POA: Insufficient documentation

## 2015-11-10 DIAGNOSIS — E785 Hyperlipidemia, unspecified: Secondary | ICD-10-CM | POA: Diagnosis not present

## 2015-11-10 LAB — ECHOCARDIOGRAM COMPLETE
E decel time: 225 msec
E/e' ratio: 15.66
FS: 34 % (ref 28–44)
IVS/LV PW RATIO, ED: 1.14
LA ID, A-P, ES: 34 mm
LA diam end sys: 34 mm
LA diam index: 1.55 cm/m2
LA vol A4C: 79.7 ml
LA vol index: 38.2 mL/m2
LA vol: 84 mL
LV E/e' medial: 15.66
LV E/e'average: 15.66
LV PW d: 12.4 mm — AB (ref 0.6–1.1)
LV e' LATERAL: 6.64 cm/s
LVOT area: 3.46 cm2
LVOT diameter: 21 mm
MV Dec: 225
MV Peak grad: 4 mmHg
MV pk A vel: 79.1 m/s
MV pk E vel: 104 m/s
Reg peak vel: 338 cm/s
TAPSE: 24 mm
TDI e' lateral: 6.64
TDI e' medial: 4.57
TR max vel: 338 cm/s

## 2015-11-10 NOTE — Progress Notes (Signed)
  Echocardiogram 2D Echocardiogram has been performed.  Arvil ChacoFoster, Preciliano Castell 11/10/2015, 3:50 PM

## 2016-06-17 NOTE — Progress Notes (Signed)
Cardiology Office Note   Date:  06/18/2016   ID:  Regina Sandoval, DOB Sep 07, 1940, MRN [id number]  PCP:  Thane Edu, MD    No chief complaint on file. AFib   Wt Readings from Last 3 Encounters:  06/18/16 220 lb (99.8 kg)  10/24/15 221 lb 14.4 oz (100.7 kg)       History of Present Illness: Regina Sandoval is a 76 y.o. female  who is [facility] by [facility]. She has a history of dementia and chronic alcoholism. Her alcoholism apparently is in remission. She has a cardiac history including hypertension, hypercholesterolemia, edema and diastolic heart failure. She is a former smoker.  In December 2017, she had new onset atrial fibrillation. She has been treated with Xarelto for stroke prevention since that time. We are asked to evaluate her A. fib and see if she is a candidate for cardioversion or amiodarone. According to the records, she has been asymptomatic with her atrial fibrillation.  She did have an echocardiogram in June 2017.  This showed normal left ventricular function with mildly dilated left atrium. No significant valvular abnormalities were noted.  No personal h/o CAD.  No CP or SHOB.  She has occasional leg swelling.  She is unsure of her meds or why she is here today.    We discussed her AFib history.  She denies palpitations CP or SHOB. [facility] with her meds. THey put everything in a pill box for her.      Past Medical History:  Diagnosis Date  . Alcohol dependence in remission (HCC)   . Alcoholism (HCC)   . Arthritis   . Cataract   . CHF (congestive heart failure) (HCC)   . CKD (chronic kidney disease), stage III   . CVA (cerebral infarction)   . Dementia   . Edema   . Gout   . Hypercholesterolemia   . Hyperlipidemia   . Hypertension   . Murmur   . New onset atrial fibrillation (HCC)   . Osteoarthrosis, generalized, involving multiple sites    INVOLVING RIGHT HIP ,KNEE. AND ANKLE  . Seizure disorder (HCC)   . Seizures (HCC)   .  Subclinical hyperthyroidism   . Vitamin D deficiency     No past surgical history on file.   Current Outpatient Prescriptions  Medication Sig Dispense Refill  . acetaminophen (TYLENOL) 500 MG tablet Take 500 mg by mouth every 6 (six) hours as needed (Arthritic pain).    Marland Kitchen allopurinol (ZYLOPRIM) 100 MG tablet Take 100 mg by mouth daily.    Marland Kitchen amLODipine (NORVASC) 10 MG tablet Take 1 tablet (10 mg total) by mouth daily. 30 tablet 0  . aspirin 81 MG tablet Take 81 mg by mouth daily.    Marland Kitchen atorvastatin (LIPITOR) 40 MG tablet Take 40 mg by mouth daily.    Marland Kitchen azithromycin (ZITHROMAX) 250 MG tablet Take 1 tablet (250 mg total) by mouth daily. 3 tablet 0  . carvedilol (COREG CR) 80 MG 24 hr capsule Take 80 mg by mouth daily.    . Cholecalciferol (VITAMIN D PO) Take 1 tablet by mouth daily.    . cloNIDine (CATAPRES - DOSED IN MG/24 HR) 0.1 mg/24hr patch Place 0.1 mg onto the skin once a week.    . colchicine 0.6 MG tablet Take 0.6 mg by mouth daily.    Marland Kitchen donepezil (ARICEPT) 10 MG tablet Take 10 mg by mouth at bedtime.    . fluticasone (FLONASE) 50 MCG/ACT nasal  spray Place 1 spray into both nostrils daily.    Marland Kitchen guaiFENesin (MUCINEX) 600 MG 12 hr tablet Take 1 tablet (600 mg total) by mouth 2 (two) times daily as needed for cough or to loosen phlegm. 6 tablet 0  . ipratropium-albuterol (DUONEB) 0.5-2.5 (3) MG/3ML SOLN Take 3 mLs by nebulization every 4 (four) hours as needed.    Marland Kitchen losartan (COZAAR) 100 MG tablet Take 100 mg by mouth daily.    . metoprolol (TOPROL-XL) 200 MG 24 hr tablet Take 200 mg by mouth daily.    . rivaroxaban (XARELTO) 20 MG TABS tablet Take 20 mg by mouth daily with supper.     No current facility-administered medications for this visit.     Allergies:   Penicillins    Social History:  The patient  reports that she has quit smoking. Her smoking use included Cigarettes. She has a 50.00 pack-year smoking history. She has never used smokeless tobacco. She reports that she does  not drink alcohol.   Family History:  The patient's family history includes Asthma in her grandchild; Diabetes in her daughter; Hypertension in her father. No early CAD   ROS:  Please see the history of present illness.   Otherwise, review of systems are positive for occasional leg swelling.   All other systems are reviewed and negative.    PHYSICAL EXAM: VS:  BP (!) 150/82 (BP Location: Left Arm, Patient Position: Sitting, Cuff Size: Large)   Pulse 64   Ht 5' 10.8" (1.798 m)   Wt 220 lb (99.8 kg)   SpO2 98%   BMI 30.86 kg/m  , BMI Body mass index is 30.86 kg/m. GEN: Well nourished, well developed, in no acute distress  HEENT: normal  Neck: no JVD, carotid bruits, or masses Cardiac: irregular , tachycardic; no murmurs, rubs, or gallops,; bilateral pitting edema  Respiratory:  clear to auscultation bilaterally, normal work of breathing GI: soft, nontender, nondistended, + BS MS: no deformity or atrophy  Skin: warm and dry, no rash Neuro:  Strength and sensation are intact Psych: euthymic mood, full affect   EKG:   The ekg ordered today demonstrates AFib with RVR   Recent Labs: 10/24/2015: B Natriuretic Peptide 103.3; BUN 17; Creatinine, Ser 1.16; Potassium 4.1; Sodium 139 10/25/2015: Hemoglobin 11.9; Platelets 218   Lipid Panel    Component Value Date/Time   CHOL 264 (H) 05/15/2010 2108   TRIG 83 05/15/2010 2108   HDL 68 05/15/2010 2108   CHOLHDL 3.9 Ratio 05/15/2010 2108   VLDL 17 05/15/2010 2108   LDLCALC 179 (H) 05/15/2010 2108     Other studies Reviewed: Additional studies/ records that were reviewed today with results demonstrating: Hospital echo.   ASSESSMENT AND PLAN:  1. Atrial fibrillation: Rate control medicines have been increased. She is now anticoagulated with Xarelto as well. [facility] pulse is checked on a regular basis.  No bleeding problems.  She is asymptomatic from the atrial fibrillation. We discussed the process of cardioversion. She  would not agree to this. Given that she is asymptomatic, I don't think cardioversion is absolutely necessary. She does need better rate control. We currently don't have an accurate list of her medications. From the prior notes, it appears that she is taking metoprolol 200 mg daily. I would suggest adding diltiazem for additional rate control. She states that she is taking her medicines as prescribed. [facility] from [facility] so I'm not sure if Dr. Dorothe Pea has to order the  medication. 2. Hyperlipidemia: Elevated LDL in the past. Continue atorvastatin. 3. Hypertension: Blood pressure mildly elevated today. Indications being adjusted by primary care physician. Ideally, would see her systolic below 140 and diastolic below 90. Try to limit salt intake as well. Continue to avoid alcohol. 4. CRI: Cr 1.22 in Dec 2017. Normal T4 in 12/17: (1.54). 5. Diastolic heart failure: BNP 264 in 12/17.  She is unsure whether she is taking the diuretic or not. She probably would benefit from at least a low-dose diuretic to help decrease her fluid retention in the setting of atrial fibrillation and chronic diastolic heart failure.  Overall, the patient may clear she will not have any procedures performed. I think we will have to manage her with a strategy of rate control and anticoagulation. I stressed the importance of taking Xarelto to help prevent stroke.  [facility] currently closed due [facility].  WIll have to get a med list from them  This patients CHA2DS2-VASc Score and unadjusted Ischemic Stroke Rate (% per year) is equal to 4.8 % stroke rate/year from a score of 4  Above score calculated as 1 point each if present [CHF, HTN, DM, Vascular=MI/PAD/Aortic Plaque, Age if 65-74, or Female] Above score calculated as 2 points each if present [Age > 75, or Stroke/TIA/TE]     Current medicines are reviewed at length with the patient today.  The patient concerns regarding her medicines were addressed.  The following  changes have been made:  No change  Labs/ tests ordered today include:  No orders of the defined types were placed in this encounter.   Recommend 150 minutes/week of aerobic exercise Low fat, low carb, high fiber diet recommended  Disposition:   [facility] in 4 weeks with ECG to check rate control   Signed, Lance MussJayadeep Andilyn Bettcher, MD  06/18/2016 9:29 AM    [facility] [address], [place], [place]  [address] Phone: [phone]; Fax: [phone]

## 2016-06-18 ENCOUNTER — Encounter: Payer: Self-pay | Admitting: Interventional Cardiology

## 2016-06-18 ENCOUNTER — Ambulatory Visit (INDEPENDENT_AMBULATORY_CARE_PROVIDER_SITE_OTHER): Payer: Medicare (Managed Care) | Admitting: Interventional Cardiology

## 2016-06-18 VITALS — BP 150/82 | HR 64 | Ht 70.8 in | Wt 220.0 lb

## 2016-06-18 DIAGNOSIS — I481 Persistent atrial fibrillation: Secondary | ICD-10-CM

## 2016-06-18 DIAGNOSIS — I10 Essential (primary) hypertension: Secondary | ICD-10-CM

## 2016-06-18 DIAGNOSIS — E7800 Pure hypercholesterolemia, unspecified: Secondary | ICD-10-CM

## 2016-06-18 DIAGNOSIS — I5032 Chronic diastolic (congestive) heart failure: Secondary | ICD-10-CM | POA: Diagnosis not present

## 2016-06-18 DIAGNOSIS — N183 Chronic kidney disease, stage 3 unspecified: Secondary | ICD-10-CM

## 2016-06-18 DIAGNOSIS — I4819 Other persistent atrial fibrillation: Secondary | ICD-10-CM

## 2016-06-18 DIAGNOSIS — N189 Chronic kidney disease, unspecified: Secondary | ICD-10-CM | POA: Insufficient documentation

## 2016-06-18 NOTE — Patient Instructions (Signed)
Medication Instructions:  Same-no changes  Labwork: None  Testing/Procedures: None  Follow-Up: With an APP in 3 to 4 weeks for rate control and will need an EKG at that visit.     If you need a refill on your cardiac medications before your next appointment, please call your pharmacy.

## 2016-06-21 NOTE — Addendum Note (Signed)
**Note De-Identified Novalynn Branaman Obfuscation** Addended by: Demetrios LollVIA, Jarom Govan M on: 06/21/2016 11:48 AM   Modules accepted: Orders

## 2016-06-21 NOTE — Addendum Note (Signed)
Addended by: Demetrios LollVIA, PATRICIA M on: 06/21/2016 07:28 AM   Modules accepted: Orders

## 2016-07-11 ENCOUNTER — Ambulatory Visit (INDEPENDENT_AMBULATORY_CARE_PROVIDER_SITE_OTHER): Payer: Medicare (Managed Care) | Admitting: Physician Assistant

## 2016-07-11 ENCOUNTER — Encounter (INDEPENDENT_AMBULATORY_CARE_PROVIDER_SITE_OTHER): Payer: Self-pay

## 2016-07-11 ENCOUNTER — Encounter: Payer: Self-pay | Admitting: Physician Assistant

## 2016-07-11 VITALS — BP 110/50 | HR 51 | Ht 69.0 in | Wt 219.1 lb

## 2016-07-11 DIAGNOSIS — I481 Persistent atrial fibrillation: Secondary | ICD-10-CM

## 2016-07-11 DIAGNOSIS — I10 Essential (primary) hypertension: Secondary | ICD-10-CM | POA: Diagnosis not present

## 2016-07-11 DIAGNOSIS — I4819 Other persistent atrial fibrillation: Secondary | ICD-10-CM

## 2016-07-11 DIAGNOSIS — I5032 Chronic diastolic (congestive) heart failure: Secondary | ICD-10-CM

## 2016-07-11 NOTE — Patient Instructions (Signed)
Medication Instructions:  The current medical regimen is effective;  continue present plan and medications.  Follow-Up: Follow up in 4 months  with Dr. Eldridge DaceVaranasi.    If you need a refill on your cardiac medications before your next appointment, please call your pharmacy.  Thank you for choosing [facility]!!

## 2016-07-11 NOTE — Progress Notes (Signed)
Cardiology Office Note    Date:  07/11/2016   ID:  Regina Sandoval, DOB 04-10-1941, MRN [id number]  PCP:  Thane EduKOEHLER,ROBERT NICHOLAS, MD  Cardiologist:   Chief Complaint  Patient presents with  . Persistent Atrial Fibrillation    History of Present Illness:  Regina Sandoval is a 76 y.o. female patient seen by Dr. Eldridge DaceVaranasi 06/18/16. She has a history of dementia and chronic alcoholism. Her alcoholism apparently is in remission. She has a cardiac history including hypertension, hypercholesterolemia, edema and diastolic heart failure. She is a former smoker.   In December 2017, she had new onset atrial fibrillation. She has been treated with Xarelto for stroke prevention since that time. We are asked to evaluate her A. fib and see if she is a candidate for cardioversion or amiodarone. According to the records, she has been asymptomatic with her atrial fibrillation.   She did have an echocardiogram in June 2017.  This showed normal left ventricular function with mildly dilated left atrium. No significant valvular abnormalities were noted.   Patient is asymptomatic from her atrial fibrillation. Her rate was uncontrolled at 133/m. She would not  consider cardioversion. He recommended adding diltiazem to metoprolol to 200 mg daily. He also recommended low-dose diuretic to help with her diastolic heart failure.CHADSVASC=4. It was unclear if Dr. Dorothe PeaKoehler had to order this.  She comes in today by herself. She did [facility] from [facility] that lists Lasix 20 mg daily which is new from last office visit. She is not on diltiazem. She is on amlodipine 10 mg daily. She is in sinus bradycardia 51 bpm. She is asymptomatic. She denies chest pain, palpitations, dyspnea, dyspnea on exertion, dizziness, or presyncope. Her daughter fixes her medications so she doesn't know what she takes other than what is on the list we've been given from [facility].       Past Medical History:  Diagnosis Date  . Alcohol  dependence in remission (HCC)   . Alcoholism (HCC)   . Arthritis   . Cataract   . CHF (congestive heart failure) (HCC)   . CKD (chronic kidney disease), stage III   . CVA (cerebral infarction)   . Dementia   . Edema   . Gout   . Hypercholesterolemia   . Hyperlipidemia   . Hypertension   . Murmur   . New onset atrial fibrillation (HCC)   . Osteoarthrosis, generalized, involving multiple sites    INVOLVING RIGHT HIP ,KNEE. AND ANKLE  . Seizure disorder (HCC)   . Seizures (HCC)   . Subclinical hyperthyroidism   . Vitamin D deficiency     No past surgical history on file.  Current Medications: Outpatient Medications Prior to Visit  Medication Sig Dispense Refill  . acetaminophen (TYLENOL) 500 MG tablet Take 500 mg by mouth every 6 (six) hours as needed (Arthritic pain).    Marland Kitchen. allopurinol (ZYLOPRIM) 100 MG tablet Take 100 mg by mouth 2 (two) times daily.     Marland Kitchen. amLODipine (NORVASC) 10 MG tablet Take 1 tablet (10 mg total) by mouth daily. 30 tablet 0  . aspirin 81 MG tablet Take 81 mg by mouth daily.    Marland Kitchen. atorvastatin (LIPITOR) 40 MG tablet Take 40 mg by mouth daily.    . Cholecalciferol (VITAMIN D PO) Take 1 tablet by mouth daily.    . colchicine 0.6 MG tablet Take 0.6 mg by mouth daily.    Marland Kitchen. donepezil (ARICEPT) 10 MG tablet Take 10 mg by mouth at  bedtime.    . fluticasone (FLONASE) 50 MCG/ACT nasal spray Place 1 spray into both nostrils daily.    Marland Kitchen ipratropium-albuterol (DUONEB) 0.5-2.5 (3) MG/3ML SOLN Take 3 mLs by nebulization every 4 (four) hours as needed (sob or wheezing).     Marland Kitchen losartan (COZAAR) 100 MG tablet Take 100 mg by mouth daily.    . metoprolol (TOPROL-XL) 200 MG 24 hr tablet Take 200 mg by mouth daily.    . rivaroxaban (XARELTO) 20 MG TABS tablet Take 20 mg by mouth daily with supper.     No facility-administered medications prior to visit.      Allergies:   Penicillins   Social History   Social History  . Marital status: Divorced    Spouse name: N/A  .  Number of children: N/A  . Years of education: N/A   Social History Main Topics  . Smoking status: Former Smoker    Packs/day: 1.00    Years: 50.00    Types: Cigarettes  . Smokeless tobacco: Never Used  . Alcohol use No  . Drug use: Unknown  . Sexual activity: Not Asked   Other Topics Concern  . None   Social History Narrative   Lives with daughter at home     Family History:  The patient's family history includes Asthma in her grandchild; Diabetes in her daughter; Hypertension in her father.   ROS:   Please see the history of present illness.    Review of Systems  Musculoskeletal: Positive for back pain, muscle weakness, myalgias and stiffness.   All other systems reviewed and are negative.   PHYSICAL EXAM:   VS:  BP (!) 110/50   Pulse (!) 51   Ht 5\' 9"  (1.753 m)   Wt 219 lb 1.9 oz (99.4 kg)   BMI 32.36 kg/m   Physical Exam  GEN: Well nourished, well developed, in no acute distress  Neck: no JVD, carotid bruits, or masses Cardiac:RRR;1/6 systolic murmur at the left sternal border Respiratory:  clear to auscultation bilaterally, normal work of breathing GI: soft, nontender, nondistended, + BS Ext: Trace of ankle edema bilaterally without cyanosis, clubbing, Good distal pulses bilaterally MS: no deformity or atrophy  Skin: warm and dry, no rash Neuro:  Alert and Oriented x 3, Strength and sensation are intact Psych: euthymic mood, full affect  Wt Readings from Last 3 Encounters:  07/11/16 219 lb 1.9 oz (99.4 kg)  06/18/16 220 lb (99.8 kg)  10/24/15 221 lb 14.4 oz (100.7 kg)      Studies/Labs Reviewed:   EKG:  EKG is ordered today.  The ekg ordered today demonstrates Sinus bradycardia at 51 bpm with nonspecific ST-T wave changes laterally  Recent Labs: 10/24/2015: B Natriuretic Peptide 103.3; BUN 17; Creatinine, Ser 1.16; Potassium 4.1; Sodium 139 10/25/2015: Hemoglobin 11.9; Platelets 218   Lipid Panel    Component Value Date/Time   CHOL 264 (H) 05/15/2010  2108   TRIG 83 05/15/2010 2108   HDL 68 05/15/2010 2108   CHOLHDL 3.9 Ratio 05/15/2010 2108   VLDL 17 05/15/2010 2108   LDLCALC 179 (H) 05/15/2010 2108    Additional studies/ records that were reviewed today include:  2-D echo 6/23/17Study Conclusions   - Left ventricle: The cavity size was normal. There was mild   concentric hypertrophy. Systolic function was normal. The   estimated ejection fraction was in the range of 60% to 65%. Wall   motion was normal; there were no regional wall motion   abnormalities. Doppler  parameters are consistent with both   elevated ventricular end-diastolic filling pressure and elevated   left atrial filling pressure. - Left atrium: The atrium was mildly dilated. - Atrial septum: No defect or patent foramen ovale was identified. - Pulmonary arteries: PA peak pressure: 49 mm Hg (S).       ASSESSMENT:    1. Persistent atrial fibrillation (HCC)   2. Chronic diastolic heart failure (HCC)   3. Essential hypertension      PLAN:  In order of problems listed above:  Persistent atrial fibrillation currently in sinus bradycardia today. Patient is on Toprol-XL 200 mg daily and Xarelto 20 mg daily. Rate is controlled and patient is asymptomatic. No further changes today.F/U with DR. Varanasi in 4 months.  Chronic diastolic heart failure with mild ankle edema. Patient is now on low-dose Lasix 20 mg daily. Would continue.  Essential hypertension controlled    Medication Adjustments/Labs and Tests Ordered: Current medicines are reviewed at length with the patient today.  Concerns regarding medicines are outlined above.  Medication changes, Labs and Tests ordered today are listed in the Patient Instructions below. Patient Instructions  Medication Instructions:  The current medical regimen is effective;  continue present plan and medications.  Follow-Up: Follow up in 4 months  with Dr. Eldridge Dace.    If you need a refill on your cardiac medications  before your next appointment, please call your pharmacy.  Thank you for choosing [facility]!!        Signed, Jacolyn Reedy, PA-C  07/11/2016 10:40 AM    [facility] [address] [place], [place], [place]  [address] Phone: [phone]; Fax: [phone]

## 2016-10-08 ENCOUNTER — Encounter: Payer: Self-pay | Admitting: Interventional Cardiology

## 2016-10-08 ENCOUNTER — Encounter (INDEPENDENT_AMBULATORY_CARE_PROVIDER_SITE_OTHER): Payer: Self-pay

## 2016-10-08 ENCOUNTER — Ambulatory Visit (INDEPENDENT_AMBULATORY_CARE_PROVIDER_SITE_OTHER): Payer: Medicare (Managed Care) | Admitting: Interventional Cardiology

## 2016-10-08 VITALS — BP 128/88 | HR 68 | Ht 69.0 in | Wt 213.0 lb

## 2016-10-08 DIAGNOSIS — I10 Essential (primary) hypertension: Secondary | ICD-10-CM | POA: Diagnosis not present

## 2016-10-08 DIAGNOSIS — I5032 Chronic diastolic (congestive) heart failure: Secondary | ICD-10-CM

## 2016-10-08 DIAGNOSIS — N183 Chronic kidney disease, stage 3 unspecified: Secondary | ICD-10-CM

## 2016-10-08 DIAGNOSIS — I481 Persistent atrial fibrillation: Secondary | ICD-10-CM | POA: Diagnosis not present

## 2016-10-08 DIAGNOSIS — I4819 Other persistent atrial fibrillation: Secondary | ICD-10-CM

## 2016-10-08 MED ORDER — DILTIAZEM HCL ER COATED BEADS 120 MG PO CP24: 120.0000 mg | ORAL_CAPSULE | Freq: Every day | ORAL | 3 refills | Status: DC

## 2016-10-08 MED ORDER — DILTIAZEM HCL ER 120 MG PO CP12: 120.0000 mg | ORAL_CAPSULE | Freq: Every day | ORAL | 3 refills | Status: DC

## 2016-10-08 NOTE — Progress Notes (Signed)
Cardiology Office Note   Date:  10/08/2016   ID:  Regina Sandoval, DOB 1940/10/06, MRN [id number]  PCP:  Jethro Bastos, MD    No chief complaint on file. AFib   Wt Readings from Last 3 Encounters:  10/08/16 213 lb (96.6 kg)  07/11/16 219 lb 1.9 oz (99.4 kg)  06/18/16 220 lb (99.8 kg)       History of Present Illness: Regina Sandoval is a 76 y.o. female  who is referred by [facility]. She has a history of dementia and chronic alcoholism. Her alcoholism apparently is in remission. She has a cardiac history including hypertension, hypercholesterolemia, edema and diastolic heart failure. She is a former smoker.  In December 2017, she had new onset atrial fibrillation. She has been treated with Xarelto for stroke prevention since that time.   At her last visit, we decided she is not a candidate for cardioversion or amiodarone. According to the records, she has been asymptomatic with her atrial fibrillation.  She confirms that she feels no palpitations  She did have an echocardiogram in June 2017.  This showed normal left ventricular function with mildly dilated left atrium. No significant valvular abnormalities were noted.  No personal h/o CAD.  No CP or SHOB.  She has occasional leg swelling and leg weakness.     [facility]. THey put everything in a pill box for her.  Diltiazem was recommended At the last visit but was never started. She remains asymptomatic. She does not report any bleeding problems.    Past Medical History:  Diagnosis Date  . Alcohol dependence in remission (HCC)   . Alcoholism (HCC)   . Arthritis   . Cataract   . CHF (congestive heart failure) (HCC)   . CKD (chronic kidney disease), stage III   . CVA (cerebral infarction)   . Dementia   . Edema   . Gout   . Hypercholesterolemia   . Hyperlipidemia   . Hypertension   . Murmur   . New onset atrial fibrillation (HCC)   . Osteoarthrosis, generalized, involving multiple sites    INVOLVING RIGHT HIP ,KNEE. AND ANKLE  . Seizure disorder (HCC)   . Seizures (HCC)   . Subclinical hyperthyroidism   . Vitamin D deficiency     No past surgical history on file.   Current Outpatient Prescriptions  Medication Sig Dispense Refill  . acetaminophen (TYLENOL) 500 MG tablet Take 500 mg by mouth every 6 (six) hours as needed (Arthritic pain).    Marland Kitchen allopurinol (ZYLOPRIM) 100 MG tablet Take 100 mg by mouth 2 (two) times daily.     Marland Kitchen amLODipine (NORVASC) 10 MG tablet Take 1 tablet (10 mg total) by mouth daily. 30 tablet 0  . aspirin 81 MG tablet Take 81 mg by mouth daily.    Marland Kitchen atorvastatin (LIPITOR) 40 MG tablet Take 40 mg by mouth daily.    . Cholecalciferol (VITAMIN D PO) Take 1 tablet by mouth daily.    . colchicine 0.6 MG tablet Take 0.6 mg by mouth daily.    Marland Kitchen donepezil (ARICEPT) 10 MG tablet Take 10 mg by mouth at bedtime.    . fluticasone (FLONASE) 50 MCG/ACT nasal spray Place 1 spray into both nostrils daily.    Marland Kitchen ipratropium-albuterol (DUONEB) 0.5-2.5 (3) MG/3ML SOLN Take 3 mLs by nebulization every 4 (four) hours as needed (sob or wheezing).     Marland Kitchen losartan (COZAAR) 100 MG tablet Take 100 mg  by mouth daily.    . metoprolol (TOPROL-XL) 200 MG 24 hr tablet Take 200 mg by mouth daily.    . rivaroxaban (XARELTO) 20 MG TABS tablet Take 20 mg by mouth daily with supper.     No current facility-administered medications for this visit.     Allergies:   Penicillins    Social History:  The patient  reports that she has quit smoking. Her smoking use included Cigarettes. She has a 50.00 pack-year smoking history. She has never used smokeless tobacco. She reports that she does not drink alcohol.   Family History:  The patient's family history includes Asthma in her grandchild; Diabetes in her daughter; Hypertension in her father. No early CAD   ROS:  Please see the history of present illness.   Otherwise, review of systems are positive for occasional leg swelling.   All other  systems are reviewed and negative.    PHYSICAL EXAM: VS:  BP 128/88 (BP Location: Right Arm, Patient Position: Sitting, Cuff Size: Large)   Pulse 68   Ht 5\' 9"  (1.753 m)   Wt 213 lb (96.6 kg)   SpO2 97%   BMI 31.45 kg/m  , BMI Body mass index is 31.45 kg/m. GEN: Well nourished, well developed, in no acute distress  HEENT: normal  Neck: no JVD, carotid bruits, or masses Cardiac: irregular , tachycardic; no murmurs, rubs, or gallops,; bilateral pitting edema  Respiratory:  clear to auscultation bilaterally, normal work of breathing GI: soft, nontender, nondistended, + BS MS: no deformity or atrophy  Skin: warm and dry, no rash Neuro:  Strength and sensation are intact Psych: euthymic mood, full affect     Recent Labs: 10/24/2015: B Natriuretic Peptide 103.3; BUN 17; Creatinine, Ser 1.16; Potassium 4.1; Sodium 139 10/25/2015: Hemoglobin 11.9; Platelets 218   Lipid Panel    Component Value Date/Time   CHOL 264 (H) 05/15/2010 2108   TRIG 83 05/15/2010 2108   HDL 68 05/15/2010 2108   CHOLHDL 3.9 Ratio 05/15/2010 2108   VLDL 17 05/15/2010 2108   LDLCALC 179 (H) 05/15/2010 2108     Other studies Reviewed: Additional studies/ records that were reviewed today with results demonstrating: Hospital echo.   ASSESSMENT AND PLAN:  1. Atrial fibrillation: She is still anticoagulated with Xarelto as well.  No bleeding problems.  She is asymptomatic from the atrial fibrillation. She is not a candidate for cardioversion. Will try to improve rate control. It appears that she is taking metoprolol ER 200 mg daily. I would again suggest adding diltiazem CD 120 mg daily for additional rate control. She states that she is taking her medicines as prescribed. Will send in the order to her facility. 2. Hyperlipidemia: Elevated LDL in the past. Continue atorvastatin.  Managed by [facility]. 3. Hypertension: Blood pressure at higher end of normal range today. If diltiazem drops her blood pressure, with  decrease losartan 50 mg daily. Try to limit salt intake as well. Continue to avoid alcohol. 4. CRI: Cr 1.22 in Dec 2017. Normal T4 in 12/17: (1.54). 5. Diastolic heart failure: BNP 264 in 12/17.  She appears euvolemic. If she develops volume overload, She probably would benefit from at least a low-dose diuretic to help decrease her fluid retention in the setting of atrial fibrillation and chronic diastolic heart failure.  Overall, the patient may clear she will not have any procedures performed. I think we will have to manage her with a strategy of rate control and anticoagulation. I stressed the importance of  taking Xarelto to help prevent stroke.  [facility] closed [facility].  WIll have to get a med list from them  This patients CHA2DS2-VASc Score and unadjusted Ischemic Stroke Rate (% per year) is equal to 4.8 % stroke rate/year from a score of 4  Above score calculated as 1 point each if present [CHF, HTN, DM, Vascular=MI/PAD/Aortic Plaque, Age if 65-74, or Female] Above score calculated as 2 points each if present [Age > 75, or Stroke/TIA/TE]     Current medicines are reviewed at length with the patient today.  The patient concerns regarding her medicines were addressed.  The following changes have been made:  No change  Labs/ tests ordered today include:  No orders of the defined types were placed in this encounter.   Recommend 150 minutes/week of aerobic exercise Low fat, low carb, high fiber diet recommended  Disposition:   FU in 3-4 weeks with ECG to check rate control   Signed, Lance MussJayadeep Marty Uy, MD  10/08/2016 10:34 AM    [facility] [address], [place], [place]  [address] Phone: [phone](336) [phone]; Fax: [phone]

## 2016-10-08 NOTE — Patient Instructions (Signed)
Medication Instructions:  Your physician has recommended you make the following change in your medication:   START diltiazem 120 mg daily    Labwork: None ordered  Testing/Procedures: None ordered  Follow-Up: Your physician recommends that you schedule a follow-up appointment in: 3-4 weeks with Dr. Eldridge DaceVaranasi or APP.    Any Other Special Instructions Will Be Listed Below (If Applicable).     If you need a refill on your cardiac medications before your next appointment, please call your pharmacy.

## 2016-10-10 ENCOUNTER — Encounter: Payer: Self-pay | Admitting: Cardiology

## 2016-10-29 ENCOUNTER — Ambulatory Visit: Payer: Medicare (Managed Care) | Admitting: Cardiology

## 2016-11-22 ENCOUNTER — Ambulatory Visit: Payer: Medicare (Managed Care) | Admitting: Emergency Medicine

## 2016-11-22 ENCOUNTER — Encounter: Payer: Self-pay | Admitting: Emergency Medicine

## 2016-11-22 NOTE — Progress Notes (Deleted)
Cardiology Office Note    Date:  11/22/2016   ID:  Regina Sandoval, DOB 29-Jul-1940, MRN [id number]  PCP:  Jethro Bastos, MD  Cardiologist:  Dr. Eldridge Dace  CC: No chief complaint on file.   History of Present Illness:  Regina Sandoval is a 76 y.o. female with a history of hx of dementia and chronic alcoholism apparently in remission, Cardiac history of hypertension, hypercholesterolemia, edema, chronic diastolic heart failure. Hx of smoking. No personal hx of CAD, CP, or SOB.  In December 2017, she was diagnosed with new onset atrial fibrillation, anticoagulated with Xarelto for prevention of stroke. Per Dr. Eldridge Dace  she is not a candidate for cardioversion or amiodarone. She has been asymptomatic with her atrial fibrillation and does not feel palpitations. Most recent echocardiogram June 2017 showed normal LEV function with mildly dilated left atrium, no significant valvular abnormalities. She was referred to [facility] by [facility] and they help manage he medications. Diltiazem was suggested at the last two visits ***.   1. Atrial Fibrillation: Anticoagulated with Xarelto   Symptomatic: palpitations, chest pains, Taking Diltiazem? 2. Hyperlipidemia 3. Hypertension  Was higher last visit, if dilt drops BP decrease Losartan.  4. CRI 5. Diastolic heart failure:   Edema, orthopnea, DOE-- dose she need low dose lasix?  Limit salt, avoid ETOH, recommend 150 min/week or aeoribc exercise. Low fat, low carb, high fiber diet.  Past Medical History:  Diagnosis Date  . Alcohol dependence in remission (HCC)   . Arthritis   . Cataract   . Chronic anticoagulation    Xarelto  . Chronic diastolic heart failure (HCC)   . CKD (chronic kidney disease), stage III   . CVA (cerebral infarction)   . Dementia   . Edema   . Gout   . Hypercholesterolemia   . Hyperlipidemia   . Hypertension   . Murmur   . New onset atrial fibrillation (HCC)    Dx 04/2016  . Osteoarthrosis, generalized,  involving multiple sites    INVOLVING RIGHT HIP ,KNEE. AND ANKLE  . Seizure disorder (HCC)   . Seizures (HCC)   . Subclinical hyperthyroidism   . Vitamin D deficiency     No past surgical history on file.  Current Medications: Outpatient Medications Prior to Visit  Medication Sig Dispense Refill  . acetaminophen (TYLENOL) 500 MG tablet Take 500 mg by mouth every 6 (six) hours as needed (Arthritic pain).    Marland Kitchen allopurinol (ZYLOPRIM) 100 MG tablet Take 100 mg by mouth 2 (two) times daily.     Marland Kitchen amLODipine (NORVASC) 10 MG tablet Take 1 tablet (10 mg total) by mouth daily. 30 tablet 0  . aspirin 81 MG tablet Take 81 mg by mouth daily.    Marland Kitchen atorvastatin (LIPITOR) 40 MG tablet Take 40 mg by mouth daily.    . Cholecalciferol (VITAMIN D PO) Take 1 tablet by mouth daily.    . colchicine 0.6 MG tablet Take 0.6 mg by mouth daily.    Marland Kitchen diltiazem (CARDIZEM CD) 120 MG 24 hr capsule Take 1 capsule (120 mg total) by mouth daily. 90 capsule 3  . donepezil (ARICEPT) 10 MG tablet Take 10 mg by mouth at bedtime.    . fluticasone (FLONASE) 50 MCG/ACT nasal spray Place 1 spray into both nostrils daily.    Marland Kitchen ipratropium-albuterol (DUONEB) 0.5-2.5 (3) MG/3ML SOLN Take 3 mLs by nebulization every 4 (four) hours as needed (sob or wheezing).     Marland Kitchen losartan (COZAAR) 100  MG tablet Take 100 mg by mouth daily.    . metoprolol (TOPROL-XL) 200 MG 24 hr tablet Take 200 mg by mouth daily.    . rivaroxaban (XARELTO) 20 MG TABS tablet Take 20 mg by mouth daily with supper.     No facility-administered medications prior to visit.      Allergies:   Penicillins   Social History   Social History  . Marital status: Divorced    Spouse name: N/A  . Number of children: N/A  . Years of education: N/A   Social History Main Topics  . Smoking status: Former Smoker    Packs/day: 1.00    Years: 50.00    Types: Cigarettes  . Smokeless tobacco: Never Used  . Alcohol use No  . Drug use: No  . Sexual activity: Not Asked    Other Topics Concern  . None   Social History Narrative   Lives with daughter at home     Family History:  The patient's family history includes Asthma in her grandchild; Diabetes in her daughter; Hypertension in her father.     Social History:  The patient  reports that she has quit smoking. Her smoking use included Cigarettes. She has a 50.00 pack-year smoking history. She has never used smokeless tobacco. She reports that she does not drink alcohol.   .Physical Exam  Constitutional: She is well-developed, well-nourished, and in no distress. No distress.  HENT:  Head: Normocephalic and atraumatic.  Eyes: Conjunctivae and EOM are normal.  Neck: Normal range of motion.  Cardiovascular: Normal rate and regular rhythm.  Exam reveals no gallop and no friction rub.   No murmur heard. Pulmonary/Chest: Effort normal and breath sounds normal.  Abdominal: She exhibits no distension.  Musculoskeletal: Normal range of motion.  Neurological: She is alert.  Skin: Skin is dry. She is not diaphoretic.  Psychiatric: Affect normal.     Wt Readings from Last 3 Encounters:  10/08/16 213 lb (96.6 kg)  07/11/16 219 lb 1.9 oz (99.4 kg)  06/18/16 220 lb (99.8 kg)      Studies/Labs Reviewed:   EKG:  EKG is*** ordered today.  The ekg ordered today demonstrates ***  Recent Labs: No results found for requested labs within last 8760 hours.   Lipid Panel    Component Value Date/Time   CHOL 264 (H) 05/15/2010 2108   TRIG 83 05/15/2010 2108   HDL 68 05/15/2010 2108   CHOLHDL 3.9 Ratio 05/15/2010 2108   VLDL 17 05/15/2010 2108   LDLCALC 179 (H) 05/15/2010 2108    Additional studies/ records that were reviewed today include:   2D Echocardiogram Study Conclusions - Left ventricle: The cavity size was normal. There was mild   concentric hypertrophy. Systolic function was normal. The   estimated ejection fraction was in the range of 60% to 65%. Wall   motion was normal; there were no  regional wall motion   abnormalities. Doppler parameters are consistent with both   elevated ventricular end-diastolic filling pressure and elevated   left atrial filling pressure. - Left atrium: The atrium was mildly dilated. - Atrial septum: No defect or patent foramen ovale was identified. - Pulmonary arteries: PA peak pressure: 49 mm Hg (S).   ASSESSMENT & PLAN:       Medication Adjustments/Labs and Tests Ordered: Current medicines are reviewed at length with the patient today.  Concerns regarding medicines are outlined above.  Medication changes, Labs and Tests ordered today are listed in the Patient Instructions  below. There are no Patient Instructions on file for this visit.   Tilda Burrow  11/22/2016 8:38 AM    [facility] [address] [place], [place], [place]  [address] Phone: [phone]; Fax: [phone]

## 2016-12-06 ENCOUNTER — Encounter: Payer: Self-pay | Admitting: Cardiology

## 2016-12-06 ENCOUNTER — Ambulatory Visit (INDEPENDENT_AMBULATORY_CARE_PROVIDER_SITE_OTHER): Payer: Medicare (Managed Care) | Admitting: Cardiology

## 2016-12-06 VITALS — BP 156/90 | HR 133 | Ht 67.0 in | Wt 198.8 lb

## 2016-12-06 DIAGNOSIS — Z79899 Other long term (current) drug therapy: Secondary | ICD-10-CM | POA: Diagnosis not present

## 2016-12-06 MED ORDER — FUROSEMIDE 20 MG PO TABS: 20.0000 mg | ORAL_TABLET | Freq: Every day | ORAL | 3 refills | Status: DC

## 2016-12-06 NOTE — Progress Notes (Signed)
12/06/2016 Regina NuttingAlma Shelp   1940-07-11  [id number]  Primary Physician Jethro BastosKoehler, Robert N, MD Primary Cardiologist: Dr. Eldridge DaceVaranasi    Reason for Visit/CC: f/u for atrial fibrillation and chronic diastolic HF  HPI:  Regina Nuttinglma Sandoval is a 76 y.o. female followed by Dr. Eldridge DaceVaranasi who presents to clinic today for f/u for atrial fibrillation and chronic diastolic HF. She has a history of dementia and chronic alcoholism. Her alcoholism apparently is in remission. She has a cardiac history including hypertension, hypercholesterolemia, edema and diastolic heart failure. She is a former smoker.  In December 2017, she had new onset atrial fibrillation. She has been treated with Xarelto for stroke prevention since that time. It has been decided that she is not a candidate for amiodarone or cardioversion. She has been treated with rate control. She is on high dose metoprolol 200 mg daily. Dr. Abe PeopleVaransi added Cardizem 120 at her last OV 09/2016. She presents back for f/u to see if any improvement in HR.   Unfortunately, she has not yet taken her daily meds, thus difficult to assess her HR response. EKG shows afib with RVR in the 130s, however she is completely asymptomatic today with stable BP (moderately elevated, but no hypotension).  She has 2+ bilateral LEE on exam and it was noted in Dr. Hoyle BarrVaranasi's last office note to consider adding a diuretic if she continued to have edema. She denies dyspnea. No orthopnea and PND.   Current Meds  Medication Sig  . acetaminophen (TYLENOL) 500 MG tablet Take 500 mg by mouth every 6 (six) hours as needed (Arthritic pain).  Marland Kitchen. allopurinol (ZYLOPRIM) 100 MG tablet Take 100 mg by mouth 2 (two) times daily.   Marland Kitchen. amLODipine (NORVASC) 10 MG tablet Take 1 tablet (10 mg total) by mouth daily.  Marland Kitchen. aspirin 81 MG tablet Take 81 mg by mouth daily.  Marland Kitchen. atorvastatin (LIPITOR) 40 MG tablet Take 40 mg by mouth daily.  . Cholecalciferol (VITAMIN D PO) Take 1 tablet by mouth daily.  . colchicine 0.6  MG tablet Take 0.6 mg by mouth daily.  Marland Kitchen. diltiazem (CARDIZEM CD) 120 MG 24 hr capsule Take 1 capsule (120 mg total) by mouth daily.  Marland Kitchen. donepezil (ARICEPT) 10 MG tablet Take 10 mg by mouth at bedtime.  . fluticasone (FLONASE) 50 MCG/ACT nasal spray Place 1 spray into both nostrils daily.  Marland Kitchen. ipratropium-albuterol (DUONEB) 0.5-2.5 (3) MG/3ML SOLN Take 3 mLs by nebulization every 4 (four) hours as needed (sob or wheezing).   Marland Kitchen. losartan (COZAAR) 100 MG tablet Take 100 mg by mouth daily.  . metoprolol (TOPROL-XL) 200 MG 24 hr tablet Take 200 mg by mouth daily.  . rivaroxaban (XARELTO) 20 MG TABS tablet Take 20 mg by mouth daily with supper.   Allergies  Allergen Reactions  . Penicillins Hives   Past Medical History:  Diagnosis Date  . Alcohol dependence in remission (HCC)   . Arthritis   . Cataract   . Chronic anticoagulation    Xarelto  . Chronic diastolic heart failure (HCC)   . CKD (chronic kidney disease), stage III   . CVA (cerebral infarction)   . Dementia   . Edema   . Gout   . Hypercholesterolemia   . Hyperlipidemia   . Hypertension   . Murmur   . New onset atrial fibrillation (HCC)    Dx 04/2016  . Osteoarthrosis, generalized, involving multiple sites    INVOLVING RIGHT HIP ,KNEE. AND ANKLE  . Seizure disorder (HCC)   . Seizures (  HCC)   . Subclinical hyperthyroidism   . Vitamin D deficiency    Family History  Problem Relation Age of Onset  . Asthma Grandchild   . Hypertension Father   . Diabetes Daughter    No past surgical history on file. Social History   Social History  . Marital status: Divorced    Spouse name: N/A  . Number of children: N/A  . Years of education: N/A   Occupational History  . Not on file.   Social History Main Topics  . Smoking status: Former Smoker    Packs/day: 1.00    Years: 50.00    Types: Cigarettes  . Smokeless tobacco: Never Used  . Alcohol use No  . Drug use: No  . Sexual activity: Not on file   Other Topics Concern    . Not on file   Social History Narrative   Lives with daughter at home     Review of Systems: General: negative for chills, fever, night sweats or weight changes.  Cardiovascular: negative for chest pain, dyspnea on exertion, edema, orthopnea, palpitations, paroxysmal nocturnal dyspnea or shortness of breath Dermatological: negative for rash Respiratory: negative for cough or wheezing Urologic: negative for hematuria Abdominal: negative for nausea, vomiting, diarrhea, bright red blood per rectum, melena, or hematemesis Neurologic: negative for visual changes, syncope, or dizziness All other systems reviewed and are otherwise negative except as noted above.   Physical Exam:  Blood pressure (!) 156/90, pulse (!) 133, height 5\' 7"  (1.702 m), weight 198 lb 12.8 oz (90.2 kg).  General appearance: alert, cooperative, no distress and dentition in poor repair Neck: no carotid bruit and no JVD Lungs: clear to auscultation bilaterally Heart: irregularly irregular rhythm and tachy rate Extremities: 2+ bilateral LEE Pulses: 2+ and symmetric Skin: Skin color, texture, turgor normal. No rashes or lesions Neurologic: Grossly normal  EKG Atrial fibrillation with RVR in the 130s -- personally reviewed   ASSESSMENT AND PLAN:   1. Persistent Atrial Fibrillation: V-rates elevated in the 130s, however patient has not yet taken her daily meds, which makes it difficult to assess her true response. She is on high dose metoprolol 200 mg daily. Dr. Abe People added Cardizem 120 at her last OV 09/2016. I've discussed case with Dr. Seth Bake today. He agrees that given she has not yet taken her rate control meds, we should not do any further titration today. She will have to return later, on a day after she has taken her meds to better assess and to see if she will need any further adjustment.   2. Acute on chronic diastolic HF: she denies dyspnea but has 2+ bilateral LEE on exam. I've discussed case with Dr. Eldridge Dace.  We will add low dose Lasix, 20 mg. Check BMP in 1 week when she returns for f/u for her afib. We discussed low salt diet. It has been decided that she is not a candidate for amiodarone or cardioversion. Continue Xarelto.   Follow-Up with me in 1-2 weeks for reassessment of HR and fluid status.    Noelene Gang Delmer Islam, [facility] 12/06/2016 5:05 PM

## 2016-12-06 NOTE — Patient Instructions (Addendum)
Medication Instructions:   START TAKING LASIX 20 MG ONCE A DAY   If you need a refill on your cardiac medications before your next appointment, please call your pharmacy.  Labwork: IN 2 WEEKS RETURN FOR A BMET    Testing/Procedures: NONE ORDERED  TODAY    Follow-Up: IN 2 WEEKS FOLLOW UP  WITH AN APP   Any Other Special Instructions Will Be Listed Below (If Applicable).  PLEASE MAKE SURE YOU TAKE ALL YOUR MEDICINES AS PRESCRIBED

## 2016-12-23 ENCOUNTER — Encounter: Payer: Self-pay | Admitting: Physician Assistant

## 2016-12-23 NOTE — Progress Notes (Signed)
Cardiology Office Note    Date:  12/24/2016  ID:  Regina Sandoval, DOB 19-Apr-1941, MRN [id number] PCP:  Janifer Adie, MD  Cardiologist: Dr. Irish Lack   Chief Complaint: f/u HR, edema  History of Present Illness:  Regina Sandoval is a 76 y.o. female with history of persistent atrial fib (managed with rate control), alcoholism in remission, chronic diastolic CHF, dementia, CVA, CKD III, HTN, HLD (followed by PCP), seizure disorder, subclinical hyperthyroidism who presents for follow-up edema.  In December 2017 she was diagnosed with new onset atrial fib. She's been treated with Xarelto and rate control. Her med changes in 2018 are somewhat tricky to follow. At [facility] 05/2016 it was suggested she add diltiazem to her regimen; she was also on both carvedilol and metoprolol at that visit. Toprol '200mg'$  was continued but do not see rx for diltiazem sent in at that time. When seen by Ermalinda Barrios PA-C in follow-up 06/2016, EKG in 06/2016 showed sinus bradycardia with HR logged as 51 at that visit (diltiazem was not on her medicine list). When seen by Dr. Irish Lack in 09/2016, HR logged as 68bpm but exam documented as irregular and tachycardic with again recommendation to add diltiazem '120mg'$  daily. She saw Lyda Jester PA-C on 12/06/16 for regular follow-up at which time she had continued 2+ LEE and RVR 133bpm. She had not taken her meds yet that day. Lasix '20mg'$  daily was added. Per Brittainy's note, it had previously been decided she is not a candidate for amiodarone or cardioversion. I do not see further details regarding the issue of amiodarone. The patient's last labs in 04/2016 showed Hgb 13.8, BUN 20, Cr 1.22, Na 147, normal LFTs aside from alk phos 135, TSH 0.453, BNP 264, LDL 132, trig 157.  She returns for follow-up reporting that she is generally feeling well, but continues to have LEE. Her daughter states she's always had edema as long as she can remember, but it's been worse over this last year. The  patient also reports longstanding hx of asthma and DOE after 2 flights of steps, but this has been stable per her report. She's been compliant with her meds, but again, has not taken them yet today. No chest pain, dizziness, syncope, BRBPR, hematuria or any unusual bleeding. She has remained abstinent of alcohol.   Past Medical History:  Diagnosis Date  . Alcohol dependence in remission ([place])   . Arthritis   . Cataract   . Chronic anticoagulation    Xarelto  . Chronic diastolic heart failure ([place])   . CKD (chronic kidney disease), stage III   . CVA (cerebral infarction)   . Dementia   . Edema   . Gout   . Hyperlipidemia   . Hypertension   . Murmur   . Osteoarthrosis, generalized, involving multiple sites    INVOLVING RIGHT HIP ,KNEE. AND ANKLE  . Persistent atrial fibrillation ([place])    a. Dx 04/2016, on anticoag, not felt to be a candidate for amiodarone or cardioversion.  . Seizure disorder ([place])   . Subclinical hyperthyroidism   . Vitamin D deficiency     History reviewed. No pertinent surgical history.  Current Medications: Current Meds  Medication Sig  . acetaminophen (TYLENOL) 500 MG tablet Take 500 mg by mouth every 6 (six) hours as needed (Arthritic pain).  Marland Kitchen allopurinol (ZYLOPRIM) 100 MG tablet Take 100 mg by mouth 2 (two) times daily.   Marland Kitchen amLODipine (NORVASC) 10 MG tablet Take 1 tablet (10 mg total) by mouth daily.  Marland Kitchen  aspirin 81 MG tablet Take 81 mg by mouth daily.  Marland Kitchen atorvastatin (LIPITOR) 40 MG tablet Take 40 mg by mouth daily.  . Cholecalciferol (VITAMIN D PO) Take 1 tablet by mouth daily.  . colchicine 0.6 MG tablet Take 0.6 mg by mouth daily.  Marland Kitchen diltiazem (CARDIZEM CD) 120 MG 24 hr capsule Take 1 capsule (120 mg total) by mouth daily.  Marland Kitchen donepezil (ARICEPT) 10 MG tablet Take 10 mg by mouth at bedtime.  . furosemide (LASIX) 20 MG tablet Take 1 tablet (20 mg total) by mouth daily.  Marland Kitchen ipratropium-albuterol (DUONEB) 0.5-2.5 (3) MG/3ML SOLN Take 3 mLs by  nebulization every 4 (four) hours as needed (sob or wheezing).   Marland Kitchen losartan (COZAAR) 100 MG tablet Take 100 mg by mouth daily.  . metoprolol (TOPROL-XL) 200 MG 24 hr tablet Take 200 mg by mouth daily.  . rivaroxaban (XARELTO) 20 MG TABS tablet Take 20 mg by mouth daily with supper.     Allergies:   Penicillins   Social History   Social History  . Marital status: Divorced    Spouse name: N/A  . Number of children: N/A  . Years of education: N/A   Social History Main Topics  . Smoking status: Former Smoker    Packs/day: 1.00    Years: 50.00    Types: Cigarettes  . Smokeless tobacco: Never Used  . Alcohol use No  . Drug use: No  . Sexual activity: Not Asked   Other Topics Concern  . None   Social History Narrative   Lives with daughter at home     Family History:  Family History  Problem Relation Age of Onset  . Asthma Grandchild   . Hypertension Father   . Diabetes Daughter     ROS:   Please see the history of present illness. All other systems are reviewed and otherwise negative.    PHYSICAL EXAM:   VS:  BP 124/76   Pulse (!) 122   Ht '5\' 7"'$  (1.702 m)   Wt 169 lb 14.4 oz (77.1 kg)   SpO2 99%   BMI 26.61 kg/m   BMI: Body mass index is 26.61 kg/m. GEN: Well nourished, well developed AAF in no acute distress  HEENT: normocephalic, atraumatic Neck: no JVD, carotid bruits, or masses Cardiac: irregularly irregular, tachycardic; no murmurs, rubs, or gallops, 2+ stiff BLE edema, no weeping Respiratory:  clear to auscultation bilaterally, normal work of breathing GI: soft, nontender, nondistended, + BS MS: no deformity or atrophy  Skin: warm and dry, no rash Neuro:  Alert and Oriented x 3, Strength and sensation are intact, follows commands Psych: euthymic mood, full affect  Wt Readings from Last 3 Encounters:  12/24/16 169 lb 14.4 oz (77.1 kg)  12/06/16 198 lb 12.8 oz (90.2 kg)  10/08/16 213 lb (96.6 kg)      Studies/Labs Reviewed:   EKG:  EKG was  ordered today and personally reviewed by me and demonstrates atrial fib 122bpm nonspecific ST-T changes  Recent Labs: No results found for requested labs within last 8760 hours.   Lipid Panel    Component Value Date/Time   CHOL 264 (H) 05/15/2010 2108   TRIG 83 05/15/2010 2108   HDL 68 05/15/2010 2108   CHOLHDL 3.9 Ratio 05/15/2010 2108   VLDL 17 05/15/2010 2108   LDLCALC 179 (H) 05/15/2010 2108    Additional studies/ records that were reviewed today include: Summarized above    ASSESSMENT & PLAN:   1. Persistent atrial  fib - remains in rapid rate today. Needs labs today to exclude metabolic abnormality (anemia, worsening renal function, electrolyte disturbance, etc). Note prior HR when in sinus has been in the 50s so need to be cautious with rate control changes. She is on both diltiazem and amlodipine for unclear reasons. Will stop amlodipine and increase diltiazem to '180mg'$  daily. Continue metoprolol. Continue Xarelto but need to f/u GFR to make sure dose remains appropriate given her CKD - she has also lost a significant amount of weight over the last year which may warrant decrease in dose. Needs updated thyroid function to exclude progressive hyperthyroidism. At this time I do not see an indication for her to be on both aspirin and Xarelto so will stop aspirin to reduce risk of bleeding given her age and CKD. ER precautions reviewed with patient. 2. Acute on chronic diastolic CHF - remains with significant edema on exam, worse over the last year per patient. Hard to know what is the chicken or the egg here - could be worsened by poor ventricular rate control and atrial fib, or the CHF could be driving her rapid rates. I am hopeful her edema will improve with discontinuation of amlodipine. This may take several days to wash out fully, so will have her increase her Lasix to '40mg'$  for 4 days then back to '20mg'$  daily. This is contingent of course on stability of renal function. She does not  drink excess fluids. She does report good UOP on the low dose Lasix. 3. CKD stage III - recheck BMET today. 4. Essential HTN - follow BP with med changes.  Disposition: F/u in clinic in 1 week for BP/HR recheck.   Medication Adjustments/Labs and Tests Ordered: Current medicines are reviewed at length with the patient today.  Concerns regarding medicines are outlined above. Medication changes, Labs and Tests ordered today are summarized above and listed in the Patient Instructions accessible in Encounters.   Signed, Charlie Pitter, PA-C  12/24/2016 8:10 AM    [facility] [place], [place], [place]  [address] Phone: [phone]; Fax: [phone]

## 2016-12-24 ENCOUNTER — Ambulatory Visit (INDEPENDENT_AMBULATORY_CARE_PROVIDER_SITE_OTHER): Payer: Medicare (Managed Care) | Admitting: Physician Assistant

## 2016-12-24 ENCOUNTER — Encounter: Payer: Self-pay | Admitting: Physician Assistant

## 2016-12-24 ENCOUNTER — Other Ambulatory Visit: Payer: Medicare (Managed Care) | Admitting: *Deleted

## 2016-12-24 VITALS — BP 124/76 | HR 122 | Ht 67.0 in | Wt 169.9 lb

## 2016-12-24 DIAGNOSIS — I481 Persistent atrial fibrillation: Secondary | ICD-10-CM | POA: Diagnosis not present

## 2016-12-24 DIAGNOSIS — I10 Essential (primary) hypertension: Secondary | ICD-10-CM

## 2016-12-24 DIAGNOSIS — I4819 Other persistent atrial fibrillation: Secondary | ICD-10-CM

## 2016-12-24 DIAGNOSIS — N183 Chronic kidney disease, stage 3 unspecified: Secondary | ICD-10-CM

## 2016-12-24 DIAGNOSIS — I5033 Acute on chronic diastolic (congestive) heart failure: Secondary | ICD-10-CM | POA: Diagnosis not present

## 2016-12-24 LAB — HEPATIC FUNCTION PANEL
ALBUMIN: 3.8 g/dL (ref 3.5–4.8)
ALK PHOS: 136 IU/L — AB (ref 39–117)
ALT: 9 IU/L (ref 0–32)
AST: 16 IU/L (ref 0–40)
BILIRUBIN, DIRECT: 0.14 mg/dL (ref 0.00–0.40)
Bilirubin Total: 0.6 mg/dL (ref 0.0–1.2)
TOTAL PROTEIN: 7 g/dL (ref 6.0–8.5)

## 2016-12-24 LAB — BASIC METABOLIC PANEL
BUN / CREAT RATIO: 20 (ref 12–28)
BUN: 21 mg/dL (ref 8–27)
CO2: 27 mmol/L (ref 20–29)
CREATININE: 1.06 mg/dL — AB (ref 0.57–1.00)
Calcium: 9.7 mg/dL (ref 8.7–10.3)
Chloride: 107 mmol/L — ABNORMAL HIGH (ref 96–106)
GFR calc Af Amer: 58 mL/min/{1.73_m2} — ABNORMAL LOW (ref >59)
GFR, EST NON AFRICAN AMERICAN: 50 mL/min/{1.73_m2} — AB (ref >59)
Glucose: 95 mg/dL (ref 65–99)
POTASSIUM: 4 mmol/L (ref 3.5–5.2)
SODIUM: 143 mmol/L (ref 134–144)

## 2016-12-24 LAB — CBC WITH DIFFERENTIAL/PLATELET
BASOS ABS: 0 10*3/uL (ref 0.0–0.2)
BASOS: 0 %
EOS (ABSOLUTE): 0.1 10*3/uL (ref 0.0–0.4)
Eos: 1 %
HEMATOCRIT: 39.2 % (ref 34.0–46.6)
HEMOGLOBIN: 13.4 g/dL (ref 11.1–15.9)
LYMPHS: 40 %
Lymphocytes Absolute: 2.3 10*3/uL (ref 0.7–3.1)
MCH: 28.3 pg (ref 26.6–33.0)
MCHC: 34.2 g/dL (ref 31.5–35.7)
MCV: 83 fL (ref 79–97)
MONOCYTES: 9 %
Monocytes Absolute: 0.5 10*3/uL (ref 0.1–0.9)
NEUTROS ABS: 2.8 10*3/uL (ref 1.4–7.0)
NEUTROS PCT: 50 %
Platelets: 167 10*3/uL (ref 150–379)
RBC: 4.73 x10E6/uL (ref 3.77–5.28)
RDW: 15.1 % (ref 12.3–15.4)
WBC: 5.6 10*3/uL (ref 3.4–10.8)

## 2016-12-24 LAB — PRO B NATRIURETIC PEPTIDE: NT-Pro BNP: 1572 pg/mL — ABNORMAL HIGH (ref 0–738)

## 2016-12-24 LAB — MAGNESIUM: Magnesium: 1.6 mg/dL (ref 1.6–2.3)

## 2016-12-24 LAB — TSH: TSH: 1.11 u[IU]/mL (ref 0.450–4.500)

## 2016-12-24 MED ORDER — FUROSEMIDE 20 MG PO TABS: 20.0000 mg | ORAL_TABLET | Freq: Every day | ORAL | 3 refills | Status: DC

## 2016-12-24 MED ORDER — DILTIAZEM HCL ER COATED BEADS 180 MG PO CP24: 180.0000 mg | ORAL_CAPSULE | Freq: Every day | ORAL | 2 refills | Status: DC

## 2016-12-24 NOTE — Patient Instructions (Addendum)
Medication Instructions: Your physician has recommended you make the following change in your medication:  -1) STOP Amlodipine -2) STOP Aspirin -3) INCREASE Diltiazem (Cardizem) - Take 1 tablet (180 mg) by mouth da ily -4) INCREASE Furosemide (Lasix) - Take 2 tablets (40 mg) by mouth daily FOR 4 DAYS, then resume 1 tablet (20 mg) by mouth daily  Labwork: Your physician recommends that you have lab work today: BMET, Mag, CBC, TSH, LFT's, and BNP   Procedures/Testing: None Ordered   Follow-Up: Your physician recommends that you schedule a follow-up appointment on Monday with an APP    Any Additional Special Instructions Will Be Listed Below (If Applicable).     If you need a refill on your cardiac medications before your next appointment, please call your pharmacy.

## 2016-12-27 ENCOUNTER — Telehealth: Payer: Self-pay | Admitting: Physician Assistant

## 2016-12-27 NOTE — Telephone Encounter (Signed)
Sonya from [facility] called to report that patient needs to come on Monday at 1130, not Tuesday. Informed her there is no availability on Monday at the given time. She states she will have a caregiver come with her so she is not alone and will keep the appointment on Tuesday.

## 2016-12-27 NOTE — Telephone Encounter (Signed)
Sonya with [facility] calling, asked to speak with Triage nurse in regards to getting patient seen at office.

## 2016-12-31 ENCOUNTER — Telehealth: Payer: Self-pay | Admitting: Physician Assistant

## 2016-12-31 ENCOUNTER — Ambulatory Visit (INDEPENDENT_AMBULATORY_CARE_PROVIDER_SITE_OTHER): Payer: Medicare (Managed Care) | Admitting: Nurse Practitioner

## 2016-12-31 ENCOUNTER — Encounter: Payer: Self-pay | Admitting: Nurse Practitioner

## 2016-12-31 ENCOUNTER — Telehealth: Payer: Self-pay | Admitting: *Deleted

## 2016-12-31 ENCOUNTER — Telehealth: Payer: Self-pay | Admitting: Nurse Practitioner

## 2016-12-31 VITALS — BP 140/90 | HR 99 | Ht 66.0 in | Wt 200.6 lb

## 2016-12-31 DIAGNOSIS — N183 Chronic kidney disease, stage 3 unspecified: Secondary | ICD-10-CM

## 2016-12-31 DIAGNOSIS — I5032 Chronic diastolic (congestive) heart failure: Secondary | ICD-10-CM

## 2016-12-31 DIAGNOSIS — I4819 Other persistent atrial fibrillation: Secondary | ICD-10-CM

## 2016-12-31 DIAGNOSIS — Z79899 Other long term (current) drug therapy: Secondary | ICD-10-CM

## 2016-12-31 DIAGNOSIS — I481 Persistent atrial fibrillation: Secondary | ICD-10-CM | POA: Diagnosis not present

## 2016-12-31 DIAGNOSIS — I10 Essential (primary) hypertension: Secondary | ICD-10-CM | POA: Diagnosis not present

## 2016-12-31 LAB — BASIC METABOLIC PANEL
BUN/Creatinine Ratio: 19 (ref 12–28)
BUN: 20 mg/dL (ref 8–27)
CO2: 24 mmol/L (ref 20–29)
Calcium: 9.4 mg/dL (ref 8.7–10.3)
Chloride: 106 mmol/L (ref 96–106)
Creatinine, Ser: 1.03 mg/dL — ABNORMAL HIGH (ref 0.57–1.00)
GFR calc Af Amer: 60 mL/min/{1.73_m2} (ref >59)
GFR calc non Af Amer: 52 mL/min/{1.73_m2} — ABNORMAL LOW (ref >59)
Glucose: 100 mg/dL — ABNORMAL HIGH (ref 65–99)
Potassium: 4.1 mmol/L (ref 3.5–5.2)
Sodium: 145 mmol/L — ABNORMAL HIGH (ref 134–144)

## 2016-12-31 LAB — PRO B NATRIURETIC PEPTIDE: NT-Pro BNP: 1571 pg/mL — ABNORMAL HIGH (ref 0–738)

## 2016-12-31 NOTE — Telephone Encounter (Signed)
New message     Needs current office notes faxed to them from today visit  Fax [phone], patient came back to facility with old records?  or [phone] another office number to reach [place] at

## 2016-12-31 NOTE — Telephone Encounter (Signed)
F/u message  Calling to f/u on results. Please call back to discuss

## 2016-12-31 NOTE — Telephone Encounter (Signed)
Pt was in today for ov faxed ov note to [facility]. Fax # is [phone] phone number is [phone].

## 2016-12-31 NOTE — Progress Notes (Signed)
CARDIOLOGY OFFICE NOTE  Date:  12/31/2016    Regina NuttingAlma Sandoval Date of Birth: 03/29/1941 Medical Record #[id number]  PCP:  Jethro BastosKoehler, Robert N, MD  Cardiologist:  [facility]  Chief Complaint  Patient presents with  . Atrial Fibrillation    1 week check - seen for Dr. Eldridge DaceVaranasi    History of Present Illness: Regina Sandoval is a 76 y.o. female who presents today for a one week check. Seen for Dr. Eldridge DaceVaranasi.   She has a history of persistent atrial fib (managed with rate control), alcoholism in remission, chronic diastolic CHF, dementia, CVA, CKD III, HTN, HLD (followed by PCP), seizure disorder, & subclinical hyperthyroidism.    In December 2017 she was diagnosed with new onset atrial fib. She's been treated with Xarelto and rate control. Her med changes in 2018 noted to be somewhat tricky to follow. Previously noted by Ronie Spiesayna Dunn, PA - "At OV 05/2016 it was suggested she add diltiazem to her regimen; she was also on both carvedilol and metoprolol at that visit. Toprol 200mg  was continued but did not see rx for diltiazem sent in at that time. When seen by Jacolyn ReedyMichele Lenze PA-C in follow-up 06/2016, EKG in 06/2016 showed sinus bradycardia with HR logged as 51 at that visit (diltiazem was not on her medicine list). When seen by Dr. Eldridge DaceVaranasi in 09/2016, HR logged as 68bpm but exam documented as irregular and tachycardic with again recommendation to add diltiazem 120mg  daily. She saw Robbie LisBrittainy Simmons PA-C on 12/06/16 for regular follow-up at which time she had continued 2+ LEE and RVR 133bpm. She had not taken her meds yet that day. Lasix 20mg  daily was added. Per Brittainy's note, it had previously been decided she is not a candidate for amiodarone or cardioversion. I do not see further details regarding the issue of amiodarone".  Seen here last week by Dayna - stable DOE, chronic swelling noted.  Heart rate was fast. She was on 2 CCB's - unclear as to why. Dayna stopped one and increased the other for better  rate control. Labs checked. Early follow up back here planned.   Thus added to my schedule for today.   Comes in today. Here with her case manager/RN. Regina Sandoval says she is "great". No chest pain. Breathing is fine. Her swelling has improved. She is not dizzy. No falls. She has no concerns. Labs from last week given to her RN here today. No awareness of the AF. Was in sinus brady in February - otherwise other EKG's from this year AF. Heart rate is improved from a week ago. She tells me over and over that her swelling has improved - tends to have some typically as a baseline.   Past Medical History:  Diagnosis Date  . Alcohol dependence in remission (HCC)   . Arthritis   . Cataract   . Chronic anticoagulation    Xarelto  . Chronic diastolic heart failure (HCC)   . CKD (chronic kidney disease), stage III   . CVA (cerebral infarction)   . Dementia   . Edema   . Gout   . Hyperlipidemia   . Hypertension   . Murmur   . Osteoarthrosis, generalized, involving multiple sites    INVOLVING RIGHT HIP ,KNEE. AND ANKLE  . Persistent atrial fibrillation (HCC)    a. Dx 04/2016, on anticoag, not felt to be a candidate for amiodarone or cardioversion.  . Seizure disorder (HCC)   . Subclinical hyperthyroidism   . Vitamin D deficiency  No past surgical history on file.   Medications: Current Meds  Medication Sig  . acetaminophen (TYLENOL) 500 MG tablet Take 500 mg by mouth every 6 (six) hours as needed (Arthritic pain).  Marland Kitchen allopurinol (ZYLOPRIM) 100 MG tablet Take 100 mg by mouth 2 (two) times daily.   Marland Kitchen atorvastatin (LIPITOR) 40 MG tablet Take 40 mg by mouth daily.  . Cholecalciferol (VITAMIN D PO) Take 1 tablet by mouth daily.  . colchicine 0.6 MG tablet Take 0.6 mg by mouth daily.  Marland Kitchen diltiazem (CARDIZEM CD) 180 MG 24 hr capsule Take 1 capsule (180 mg total) by mouth daily.  Marland Kitchen donepezil (ARICEPT) 10 MG tablet Take 10 mg by mouth at bedtime.  . fluticasone (FLONASE) 50 MCG/ACT nasal spray  Place 1 spray into both nostrils daily.  . furosemide (LASIX) 20 MG tablet Take 1 tablet (20 mg total) by mouth daily. Take 2 (40 mg) by mouth daily for 4 days. Then resume 1 tablet (20 mg) by mouth daily (Patient taking differently: Take 20 mg by mouth daily. )  . ipratropium-albuterol (DUONEB) 0.5-2.5 (3) MG/3ML SOLN Take 3 mLs by nebulization every 4 (four) hours as needed (sob or wheezing).   . metoprolol (TOPROL-XL) 200 MG 24 hr tablet Take 200 mg by mouth daily.  . rivaroxaban (XARELTO) 20 MG TABS tablet Take 20 mg by mouth daily with supper.     Allergies: Allergies  Allergen Reactions  . Penicillins Hives    Social History: The patient  reports that she has quit smoking. Her smoking use included Cigarettes. She has a 50.00 pack-year smoking history. She has never used smokeless tobacco. She reports that she does not drink alcohol or use drugs.   Family History: The patient's family history includes Asthma in her grandchild; Diabetes in her daughter; Hypertension in her father.   Review of Systems: Please see the history of present illness.   Otherwise, the review of systems is positive for none.   All other systems are reviewed and negative.   Physical Exam: VS:  BP 140/90 (BP Location: Left Arm, Patient Position: Sitting, Cuff Size: Large)   Pulse 99   Ht 5\' 6"  (1.676 m)   Wt 200 lb 9.6 oz (91 kg)   BMI 32.38 kg/m  .  BMI Body mass index is 32.38 kg/m.  Wt Readings from Last 3 Encounters:  12/31/16 200 lb 9.6 oz (91 kg)  12/24/16 169 lb 14.4 oz (77.1 kg)  12/06/16 198 lb 12.8 oz (90.2 kg)   Weighed 219 back in May. I do not think last week's weight is accurate.   General: Pleasant. She is alert and in no acute distress.   HEENT: Normal but missing most of her teeth.   Neck: Supple, no JVD, carotid bruits, or masses noted.  Cardiac: Irregular irregular rhythm. Rate is about 100 by my count. +1 edema.  Respiratory:  Lungs are clear to auscultation bilaterally with  normal work of breathing.  GI: Soft and nontender.  MS: No deformity or atrophy. Gait not tested - she is in a wheelchair.   Skin: Warm and dry. Color is normal.  Neuro:  Strength and sensation are intact and no gross focal deficits noted.  Psych: Alert, appropriate and with normal affect.   LABORATORY DATA:  EKG:  EKG is ordered today. This demonstrates AF with a rate of 99 today.  Lab Results  Component Value Date   WBC 5.6 12/24/2016   HGB 13.4 12/24/2016   HCT 39.2 12/24/2016  PLT 167 12/24/2016   GLUCOSE 95 12/24/2016   CHOL 264 (H) 05/15/2010   TRIG 83 05/15/2010   HDL 68 05/15/2010   LDLCALC 179 (H) 05/15/2010   ALT 9 12/24/2016   AST 16 12/24/2016   NA 143 12/24/2016   K 4.0 12/24/2016   CL 107 (H) 12/24/2016   CREATININE 1.06 (H) 12/24/2016   BUN 21 12/24/2016   CO2 27 12/24/2016   TSH 1.110 12/24/2016     BNP (last 3 results) No results for input(s): BNP in the last 8760 hours.  ProBNP (last 3 results)  Recent Labs  12/24/16 0850  PROBNP 1,572*     Other Studies Reviewed Today:  Echo Study Conclusions 10/2015  - Left ventricle: The cavity size was normal. There was mild   concentric hypertrophy. Systolic function was normal. The   estimated ejection fraction was in the range of 60% to 65%. Wall   motion was normal; there were no regional wall motion   abnormalities. Doppler parameters are consistent with both   elevated ventricular end-diastolic filling pressure and elevated   left atrial filling pressure. - Left atrium: The atrium was mildly dilated. - Atrial septum: No defect or patent foramen ovale was identified. - Pulmonary arteries: PA peak pressure: 49 mm Hg (S).  Assessment/Plan:   1. Persistent atrial fib - her rate has improved - her recent labs were fairly stable. No longer on Norvasc and now on higher dose of Diltiazem for the past week - would continue with this regimen for now. She is totally asymptomatic. Remains on  Xarelto.  2. 2. Chronic diastolic CHF - her swelling has improved - she feels better clinically. I have left her on her current regimen for now. Recheck lab today.   3.  CKD stage III - recheck BMET today.  4. Essential HTN - BP ok here today - no changes made.   5. Bradycardia - noted when in sinus - may have reverted more to chronic AF. Would follow for now.   Current medicines are reviewed with the patient today.  The patient does not have concerns regarding medicines other than what has been noted above.  The following changes have been made:  See above.  Labs/ tests ordered today include:    Orders Placed This Encounter  Procedures  . Basic metabolic panel  . Pro b natriuretic peptide (BNP)     Disposition:   She has follow up next month. No changes made today. Lab results with recommendations from Dayna provided to the RN/case manager.   Patient is agreeable to this plan and will call if any problems develop in the interim.   SignedNorma Fredrickson, NP  12/31/2016 12:29 PM  [facility] [address] [place], [place]  [address] Phone: [phone] Fax: [phone]

## 2016-12-31 NOTE — Telephone Encounter (Signed)
[place] [facility]  Did not give pt old records sent back the pace sheet that said will fax ov note.  12:57 ov note was already faxed and transmission ok before [facility] called.  Will wait for pace to call back.

## 2016-12-31 NOTE — Patient Instructions (Addendum)
We will be checking the following labs today - BMET and BNP   Medication Instructions:    Continue with your current medicines.     Testing/Procedures To Be Arranged:  N/A  Follow-Up:   See us back as planned next month   Other Special Instructions:   N/A    If you need a refill on your cardiac medications before your next appointment, please call your pharmacy.   Call the [facility] office at [phone] if you have any questions, problems or concerns.

## 2017-01-01 ENCOUNTER — Other Ambulatory Visit: Payer: Self-pay | Admitting: *Deleted

## 2017-01-01 DIAGNOSIS — I4819 Other persistent atrial fibrillation: Secondary | ICD-10-CM

## 2017-01-27 ENCOUNTER — Ambulatory Visit (INDEPENDENT_AMBULATORY_CARE_PROVIDER_SITE_OTHER): Payer: Medicare (Managed Care) | Admitting: Physician Assistant

## 2017-01-27 ENCOUNTER — Encounter: Payer: Self-pay | Admitting: Physician Assistant

## 2017-01-27 VITALS — BP 128/74 | HR 90 | Ht 69.0 in | Wt 201.0 lb

## 2017-01-27 DIAGNOSIS — I10 Essential (primary) hypertension: Secondary | ICD-10-CM

## 2017-01-27 DIAGNOSIS — E7800 Pure hypercholesterolemia, unspecified: Secondary | ICD-10-CM

## 2017-01-27 DIAGNOSIS — R0602 Shortness of breath: Secondary | ICD-10-CM

## 2017-01-27 DIAGNOSIS — I5033 Acute on chronic diastolic (congestive) heart failure: Secondary | ICD-10-CM

## 2017-01-27 DIAGNOSIS — I481 Persistent atrial fibrillation: Secondary | ICD-10-CM

## 2017-01-27 DIAGNOSIS — I4819 Other persistent atrial fibrillation: Secondary | ICD-10-CM

## 2017-01-27 MED ORDER — FUROSEMIDE 40 MG PO TABS: 40.0000 mg | ORAL_TABLET | Freq: Every day | ORAL | 5 refills | Status: DC

## 2017-01-27 NOTE — Progress Notes (Signed)
Cardiology Office Note    Date:  01/27/2017   ID:  [name], DOB [birth year], MRN [id number]  PCP:  Jethro Bastos, MD  Cardiologist: Dr. Eldridge Dace  Chief Complaint  Patient presents with  . Follow-up    History of Present Illness:  Regina Sandoval is a 76 y.o. female a history of persistent atrial fib (managed with rate control), alcoholism in remission, chronic diastolic CHF, dementia, CVA, CKD III, HTN, HLD (followed by PCP), seizure disorder, & subclinical hyperthyroidism.     Patient saw Norma Fredrickson, NP 12/31/16 for 1 week follow-up of dyspnea on exertion and atrial fibrillation. She had been on 2 calcium channel blockers for unclear reason and 1 was stopped and the other increased for better rate control the visit prior to that. His biologic infusion of medications over time. No changes were made at that office visit. Last 2-D echo 10/2015 LVEF 60-65% left atrium was mildly dilated.  Patient comes in today accompanied by her daughter who continues to use her phone during the visit. She says increase in the Lasix temporarily never works because she always develops more fluid 1 since decreased. Complains of chronic dyspnea on exertion with little activity like going up stairs or walking across a room. Denies chest pain or palpitations.  Past Medical History:  Diagnosis Date  . Alcohol dependence in remission (HCC)   . Arthritis   . Cataract   . Chronic anticoagulation    Xarelto  . Chronic diastolic heart failure (HCC)   . CKD (chronic kidney disease), stage III   . CVA (cerebral infarction)   . Dementia   . Edema   . Gout   . Hyperlipidemia   . Hypertension   . Murmur   . Osteoarthrosis, generalized, involving multiple sites    INVOLVING RIGHT HIP ,KNEE. AND ANKLE  . Persistent atrial fibrillation (HCC)    a. Dx 04/2016, on anticoag, not felt to be a candidate for amiodarone or cardioversion.  . Seizure disorder (HCC)   . Subclinical hyperthyroidism   . Vitamin D  deficiency     No past surgical history on file.  Current Medications: Current Meds  Medication Sig  . acetaminophen (TYLENOL) 500 MG tablet Take 500 mg by mouth every 6 (six) hours as needed (Arthritic pain).  Marland Kitchen allopurinol (ZYLOPRIM) 100 MG tablet Take 100 mg by mouth 2 (two) times daily.   Marland Kitchen atorvastatin (LIPITOR) 40 MG tablet Take 40 mg by mouth daily.  . Cholecalciferol (VITAMIN D PO) Take 1 tablet by mouth daily.  . colchicine 0.6 MG tablet Take 0.6 mg by mouth daily.  Marland Kitchen diltiazem (CARDIZEM CD) 180 MG 24 hr capsule Take 1 capsule (180 mg total) by mouth daily.  Marland Kitchen donepezil (ARICEPT) 10 MG tablet Take 10 mg by mouth at bedtime.  . fluticasone (FLONASE) 50 MCG/ACT nasal spray Place 1 spray into both nostrils daily.  . furosemide (LASIX) 40 MG tablet Take 1 tablet (40 mg total) by mouth daily.  Marland Kitchen ipratropium-albuterol (DUONEB) 0.5-2.5 (3) MG/3ML SOLN Take 3 mLs by nebulization every 4 (four) hours as needed (sob or wheezing).   . metoprolol (TOPROL-XL) 200 MG 24 hr tablet Take 200 mg by mouth daily.  . rivaroxaban (XARELTO) 20 MG TABS tablet Take 20 mg by mouth daily with supper.  . [DISCONTINUED] furosemide (LASIX) 20 MG tablet Take 1 tablet (20 mg total) by mouth daily. Take 2 (40 mg) by mouth daily for 4 days. Then resume 1 tablet (20 mg) by mouth  daily (Patient taking differently: Take 20 mg by mouth daily. )     Allergies:   Penicillins   Social History   Social History  . Marital status: Divorced    Spouse name: N/A  . Number of children: N/A  . Years of education: N/A   Social History Main Topics  . Smoking status: Former Smoker    Packs/day: 1.00    Years: 50.00    Types: Cigarettes  . Smokeless tobacco: Never Used  . Alcohol use No  . Drug use: No  . Sexual activity: Not on file   Other Topics Concern  . Not on file   Social History Narrative   Lives with daughter at home     Family History:  The patient's   family history includes Asthma in her  grandchild; Diabetes in her daughter; Hypertension in her father.   ROS:   Please see the history of present illness.    Review of Systems  Constitution: Positive for weakness.  HENT: Negative.   Eyes: Negative.   Cardiovascular: Positive for dyspnea on exertion and irregular heartbeat.  Respiratory: Negative.   Hematologic/Lymphatic: Negative.   Musculoskeletal: Negative.  Negative for joint pain.  Gastrointestinal: Negative.   Genitourinary: Negative.    All other systems reviewed and are negative.   PHYSICAL EXAM:   VS:  BP 128/74   Pulse 90   Ht 5\' 9"  (1.753 m)   Wt 201 lb (91.2 kg)   BMI 29.68 kg/m   Physical Exam  GEN: Well nourished, well developed, in no acute distress  Neck: Increased JVD, no carotid bruits, or masses Cardiac: Irregular irregular with 1 to 2/6 systolic murmur at left sternal border Respiratory:  clear to auscultation bilaterally, normal work of breathing GI: soft, nontender, nondistended, + BS Ext: +1 edema bilaterally without cyanosis, clubbing.Good distal pulses bilaterally Neuro:  Alert and Oriented x 3, Strength and sensation are intact Psych: euthymic mood, full affect  Wt Readings from Last 3 Encounters:  01/27/17 201 lb (91.2 kg)  12/31/16 200 lb 9.6 oz (91 kg)  12/24/16 169 lb 14.4 oz (77.1 kg)      Studies/Labs Reviewed:   EKG:  EKG is  ordered today.  The ekg ordered today demonstrates Atrial fibrillation 91 bpm poor R wave progression anteriorly, no acute change  Recent Labs: 12/24/2016: ALT 9; Hemoglobin 13.4; Magnesium 1.6; Platelets 167; TSH 1.110 12/31/2016: BUN 20; Creatinine, Ser 1.03; NT-Pro BNP 1,571; Potassium 4.1; Sodium 145   Lipid Panel    Component Value Date/Time   CHOL 264 (H) 05/15/2010 2108   TRIG 83 05/15/2010 2108   HDL 68 05/15/2010 2108   CHOLHDL 3.9 Ratio 05/15/2010 2108   VLDL 17 05/15/2010 2108   LDLCALC 179 (H) 05/15/2010 2108    Additional studies/ records that were reviewed today include:     Echo Study Conclusions 10/2015   - Left ventricle: The cavity size was normal. There was mild   concentric hypertrophy. Systolic function was normal. The   estimated ejection fraction was in the range of 60% to 65%. Wall   motion was normal; there were no regional wall motion   abnormalities. Doppler parameters are consistent with both   elevated ventricular end-diastolic filling pressure and elevated   left atrial filling pressure. - Left atrium: The atrium was mildly dilated. - Atrial septum: No defect or patent foramen ovale was identified. - Pulmonary arteries: PA peak pressure: 49 mm Hg (S).     ASSESSMENT:  1. SOB (shortness of breath)   2. Acute on chronic diastolic CHF (congestive heart failure) (HCC)   3. Essential hypertension   4. Persistent atrial fibrillation (HCC)   5. HYPERCHOLESTEROLEMIA      PLAN:  In order of problems listed above:  Acute on chronic diastolic CHF with shortness of breath Lasix to 40 mg daily. BNP was 1571 on 12/31/16, Crt 1.03. Will check renal function in 2 weeks.F/U with Dr. Eldridge Dace next available.  Essential hypertension controlled  Persistent atrial fibrillation heart rate 90 on diltiazem 180 mg daily. Has history of bradycardia when in normal sinus rhythm.  Hypercholesterolemia on Lipitor    Medication Adjustments/Labs and Tests Ordered: Current medicines are reviewed at length with the patient today.  Concerns regarding medicines are outlined above.  Medication changes, Labs and Tests ordered today are listed in the Patient Instructions below. Patient Instructions  Medication Instructions:   START TAKING LASIX 40 MG ONCE A DAY   If you need a refill on your cardiac medications before your next appointment, please call your pharmacy.  Labwork: BMET TODAY    Testing/Procedures:  NONE ORDERED  TODAY    Follow-Up:  WITH DR Eldridge Dace FIRST AVAILABLE APPOINTMENT   Any Other Special Instructions Will Be Listed Below (If  Applicable).                                                                                                                                                      Signed, Jacolyn Reedy, PA-C  01/27/2017 1:31 PM    [facility] [address] [place], [place], [place]  [address] Phone: [phone]; Fax: [phone]

## 2017-01-27 NOTE — Patient Instructions (Addendum)
Medication Instructions:   START TAKING LASIX 40 MG ONCE A DAY   If you need a refill on your cardiac medications before your next appointment, please call your pharmacy.  Labwork: RETURN FOR BMET  IN TWO WEEKS   Testing/Procedures:  NONE ORDERED  TODAY    Follow-Up:  WITH DR Eldridge DaceVARANASI FIRST AVAILABLE APPOINTMENT   Any Other Special Instructions Will Be Listed Below (If Applicable).

## 2017-02-10 ENCOUNTER — Other Ambulatory Visit: Payer: Medicare (Managed Care)

## 2017-03-23 NOTE — Progress Notes (Signed)
Cardiology Office Note   Date:  03/24/2017   ID:  Regina Sandoval, DOB 07/10/1940, MRN [id number]  PCP:  Regina Bastos, MD    No chief complaint on file.    Wt Readings from Last 3 Encounters:  03/24/17 196 lb 9.6 oz (89.2 kg)  01/27/17 201 lb (91.2 kg)  12/31/16 200 lb 9.6 oz (91 kg)       History of Present Illness: Regina Sandoval is a 76 y.o. female  With a history of persistent atrial fib (managed with rate control), alcoholism in remission, chronic diastolic CHF, dementia, CVA, CKD III, HTN, HLD (followed by PCP), seizure disorder, & subclinical hyperthyroidism.    Previous recod review showed: In December 2017 she was diagnosed with new onset atrial fib. She's been treated with Xarelto and rate control.Her med changes in 2018 noted to be somewhat tricky to follow.    At OV 05/2016 it was suggested she add diltiazem to her regimen; she was also on both carvedilol and metoprolol at that visit. Toprol 200mg  was continued but did not see rx for diltiazem sent in at that time. When seen by Regina Reedy PA-C in follow-up 06/2016, EKG in 06/2016 showed sinus bradycardia with HR logged as 51 at that visit (diltiazem was not on her medicine list). When seen by Dr. Eldridge Sandoval in 09/2016, HR logged as 68bpm but exam documented as irregular and tachycardic with again recommendation to add diltiazem 120mg  daily. She saw Regina Lis PA-C on 12/06/16 for regular follow-up at which time she had continued 2+ LEE and RVR 133bpm. She had not taken her meds yet that day. Lasix 20mg  daily was added. Per Regina Sandoval's note, it had previously been decided she is not a candidate for amiodarone or cardioversion.  Swelling in 8/18 was noted to be improved at last visit.  Since the last visit, she has done well.  Denies : Chest pain. Dizziness. Leg edema. Nitroglycerin use. Orthopnea. Palpitations. Paroxysmal nocturnal dyspnea. Shortness of breath. Syncope.   No bleeding problems.  She feels that she is  tolerating her medications well.  This is the best she has felt in some time.    Past Medical History:  Diagnosis Date  . Alcohol dependence in remission (HCC)   . Arthritis   . Cataract   . Chronic anticoagulation    Xarelto  . Chronic diastolic heart failure (HCC)   . CKD (chronic kidney disease), stage III (HCC)   . CVA (cerebral infarction)   . Dementia   . Edema   . Gout   . Hyperlipidemia   . Hypertension   . Murmur   . Osteoarthrosis, generalized, involving multiple sites    INVOLVING RIGHT HIP ,KNEE. AND ANKLE  . Persistent atrial fibrillation (HCC)    a. Dx 04/2016, on anticoag, not felt to be a candidate for amiodarone or cardioversion.  . Seizure disorder (HCC)   . Subclinical hyperthyroidism   . Vitamin D deficiency     History reviewed. No pertinent surgical history.   Current Outpatient Medications  Medication Sig Dispense Refill  . acetaminophen (TYLENOL) 500 MG tablet Take 500 mg by mouth every 6 (six) hours as needed (Arthritic pain).    Marland Kitchen allopurinol (ZYLOPRIM) 100 MG tablet Take 100 mg by mouth 2 (two) times daily.     Marland Kitchen atorvastatin (LIPITOR) 40 MG tablet Take 40 mg by mouth daily.    . Cholecalciferol (VITAMIN D PO) Take 1 tablet by mouth daily.    . colchicine 0.6  MG tablet Take 0.6 mg by mouth daily.    Marland Kitchen. diltiazem (CARDIZEM CD) 180 MG 24 hr capsule Take 1 capsule (180 mg total) by mouth daily. 30 capsule 2  . donepezil (ARICEPT) 10 MG tablet Take 10 mg by mouth at bedtime.    . fluticasone (FLONASE) 50 MCG/ACT nasal spray Place 1 spray into both nostrils daily.    . furosemide (LASIX) 40 MG tablet Take 1 tablet (40 mg total) by mouth daily. 30 tablet 5  . ipratropium-albuterol (DUONEB) 0.5-2.5 (3) MG/3ML SOLN Take 3 mLs by nebulization every 4 (four) hours as needed (sob or wheezing).     . metoprolol (TOPROL-XL) 200 MG 24 hr tablet Take 200 mg by mouth daily.    . rivaroxaban (XARELTO) 20 MG TABS tablet Take 20 mg by mouth daily with supper.      No current facility-administered medications for this visit.     Allergies:   Penicillins    Social History:  The patient  reports that she has quit smoking. Her smoking use included cigarettes. She has a 50.00 pack-year smoking history. she has never used smokeless tobacco. She reports that she does not drink alcohol or use drugs.   Family History:  The patient's family history includes Asthma in her grandchild; Diabetes in her daughter; Hypertension in her father.    ROS:  Please see the history of present illness.   Otherwise, review of systems are positive for decreased leg swelling.   All other systems are reviewed and negative.    PHYSICAL EXAM: VS:  BP 130/82   Pulse 82   Ht 5\' 8"  (1.727 m)   Wt 196 lb 9.6 oz (89.2 kg)   SpO2 98%   BMI 29.89 kg/m  , BMI Body mass index is 29.89 kg/m. GEN: Well nourished, well developed, in no acute distress  HEENT: normal  Neck: no JVD, carotid bruits, or masses Cardiac: Irregularly irregular; no murmurs, rubs, or gallops,no edema ; apical heart rate approximately 100 bpm Respiratory:  clear to auscultation bilaterally, normal work of breathing GI: soft, nontender, nondistended, + BS MS: no deformity or atrophy  Skin: warm and dry, no rash Neuro:  Strength and sensation are intact Psych: euthymic mood, full affect     Recent Labs: 12/24/2016: ALT 9; Hemoglobin 13.4; Magnesium 1.6; Platelets 167; TSH 1.110 12/31/2016: BUN 20; Creatinine, Ser 1.03; NT-Pro BNP 1,571; Potassium 4.1; Sodium 145   Lipid Panel    Component Value Date/Time   CHOL 264 (H) 05/15/2010 2108   TRIG 83 05/15/2010 2108   HDL 68 05/15/2010 2108   CHOLHDL 3.9 Ratio 05/15/2010 2108   VLDL 17 05/15/2010 2108   LDLCALC 179 (H) 05/15/2010 2108     Other studies Reviewed: Additional studies/ records that were reviewed today with results demonstrating: prior notes reviewed.   ASSESSMENT AND PLAN:  1. AFib: Xarelto for stroke prevention.  Diltiazem for rate  control.  Borderline rate control but given her age and level of physical activity, I think it is adequate.  I do not want to add any additional medicine at this time. 2. Chronic diastolic heart failure: Volume status much improved at this time.  Continue current medicines. 3. CKD stage III: Continue diuretic.  Will need to follow renal function. 4. HTN: Well-controlled.  Continue current medications. 5. Bradycardia: transient in the past, without sx. Occurred with SR.    Current medicines are reviewed at length with the patient today.  The patient concerns regarding her medicines were  addressed.  The following changes have been made:  No change  Labs/ tests ordered today include:  No orders of the defined types were placed in this encounter.   Recommend 150 minutes/week of aerobic exercise Low fat, low carb, high fiber diet recommended  Disposition:   FU in 6 months   Signed, Lance Muss, MD  03/24/2017 10:22 AM    [facility] [address] [place], [place], [place]  [address] Phone: [phone]; Fax: [phone]

## 2017-03-24 ENCOUNTER — Encounter: Payer: Self-pay | Admitting: Interventional Cardiology

## 2017-03-24 ENCOUNTER — Encounter (INDEPENDENT_AMBULATORY_CARE_PROVIDER_SITE_OTHER): Payer: Self-pay

## 2017-03-24 ENCOUNTER — Ambulatory Visit (INDEPENDENT_AMBULATORY_CARE_PROVIDER_SITE_OTHER): Payer: Medicare (Managed Care) | Admitting: Interventional Cardiology

## 2017-03-24 VITALS — BP 130/82 | HR 82 | Ht 68.0 in | Wt 196.6 lb

## 2017-03-24 DIAGNOSIS — N183 Chronic kidney disease, stage 3 unspecified: Secondary | ICD-10-CM

## 2017-03-24 DIAGNOSIS — I5032 Chronic diastolic (congestive) heart failure: Secondary | ICD-10-CM

## 2017-03-24 DIAGNOSIS — I481 Persistent atrial fibrillation: Secondary | ICD-10-CM | POA: Diagnosis not present

## 2017-03-24 DIAGNOSIS — I4819 Other persistent atrial fibrillation: Secondary | ICD-10-CM

## 2017-03-24 DIAGNOSIS — I10 Essential (primary) hypertension: Secondary | ICD-10-CM | POA: Diagnosis not present

## 2017-03-24 NOTE — Patient Instructions (Signed)

## 2017-06-11 ENCOUNTER — Other Ambulatory Visit: Payer: Self-pay | Admitting: Family Medicine

## 2017-06-11 DIAGNOSIS — I739 Peripheral vascular disease, unspecified: Secondary | ICD-10-CM

## 2017-06-11 DIAGNOSIS — R209 Unspecified disturbances of skin sensation: Secondary | ICD-10-CM

## 2017-06-23 ENCOUNTER — Other Ambulatory Visit: Payer: Medicare (Managed Care)

## 2017-06-30 ENCOUNTER — Inpatient Hospital Stay: Admission: RE | Admit: 2017-06-30 | Payer: Medicare (Managed Care) | Source: Ambulatory Visit

## 2017-07-14 ENCOUNTER — Ambulatory Visit
Admission: RE | Admit: 2017-07-14 | Discharge: 2017-07-14 | Disposition: A | Discharge status: Home / Self Care | Payer: Medicare (Managed Care) | Source: Ambulatory Visit | Attending: Family Medicine | Admitting: Family Medicine

## 2017-07-14 DIAGNOSIS — I739 Peripheral vascular disease, unspecified: Secondary | ICD-10-CM

## 2017-07-14 DIAGNOSIS — R209 Unspecified disturbances of skin sensation: Secondary | ICD-10-CM

## 2017-07-24 ENCOUNTER — Non-Acute Institutional Stay (SKILLED_NURSING_FACILITY): Payer: Medicare Other | Admitting: Internal Medicine

## 2017-07-24 ENCOUNTER — Encounter: Payer: Self-pay | Admitting: Internal Medicine

## 2017-07-24 DIAGNOSIS — I481 Persistent atrial fibrillation: Secondary | ICD-10-CM

## 2017-07-24 DIAGNOSIS — N189 Chronic kidney disease, unspecified: Secondary | ICD-10-CM

## 2017-07-24 DIAGNOSIS — I10 Essential (primary) hypertension: Secondary | ICD-10-CM

## 2017-07-24 DIAGNOSIS — M109 Gout, unspecified: Secondary | ICD-10-CM | POA: Insufficient documentation

## 2017-07-24 DIAGNOSIS — M1A9XX0 Chronic gout, unspecified, without tophus (tophi): Secondary | ICD-10-CM

## 2017-07-24 DIAGNOSIS — I4819 Other persistent atrial fibrillation: Secondary | ICD-10-CM

## 2017-07-24 NOTE — Assessment & Plan Note (Addendum)
Reassess BMET as patient is on allopurinol 300 mg qd

## 2017-07-24 NOTE — Assessment & Plan Note (Signed)
Check uric acid to see if allopurinol dose can be decreased due to potential renal risk

## 2017-07-24 NOTE — Assessment & Plan Note (Signed)
BP controlled; no change in antihypertensive medications  

## 2017-07-24 NOTE — Assessment & Plan Note (Signed)
Focus should be on  rate control

## 2017-07-24 NOTE — Patient Instructions (Signed)
See assessment and plan under each diagnosis in the problem list and acutely for this visit 

## 2017-07-24 NOTE — Progress Notes (Signed)
NURSING HOME LOCATION:  [facility] ROOM NUMBER:  103-A  CODE STATUS:  Full Code  PCP:  Pecola LawlessHopper, Sennie Borden F, MD  [address] [place] [id number]   This is a nursing facility follow up of chronic medical diagnoses  Interim medical record and care since [facility] was updated with review of diagnostic studies and change in clinical status since last visit were documented.  UEA:VWUJHPI:PACE had been attending the patient at the SNF, she was released from their service as she was not participating in their outpatient program. The patient has diagnoses of persistent atrial fibrillation, essential hypertension, chronic diastolic congestive heart failure, COPD, renal insufficiency stage III, dyslipidemia, dementia, vitamin D deficiency, subclinical hyperthyroidism, seizure disorder, gout ,history of CVA, and history of alcohol abuse. She is moderately high doses of atorvastatin. The atrial fibrillation is being addressed with diltiazem, metoprolol, and Xarelto. Her last cardiology assessment was  03/24/17; chronic diastolic heart failure was felt to be stable. There is no surgical history on file.She denies any surgical procedures. [place] not [place]. TSH was 1.11 and creatinine 1.03 in 8/18.   Family history was reviewed but is not contributory due to age. As noted she formerly abused alcohol. She is also a former smoker.  Review of systems: Dementia invalidated responses. Patient unable to give the date or name of the president. She describesintermittent "arthritis and bursitis" in the right hand and left leg. This is exacerbated by cold, rainy weather. She denies a history of gout.  Constitutional: No fever, significant weight change, fatigue  Eyes: No redness, discharge, pain, vision change ENT/mouth: No nasal congestion,  purulent discharge, earache, change in hearing, sore throat  Cardiovascular: No chest pain, palpitations, paroxysmal nocturnal dyspnea,  claudication, edema  Respiratory: No cough, sputum production, hemoptysis, DOE , significant snoring, apnea   Gastrointestinal: No heartburn, dysphagia, abdominal pain, nausea /vomiting, rectal bleeding, melena, change in bowels Genitourinary: No dysuria, hematuria, pyuria, incontinence, nocturia Dermatologic: No rash, pruritus, change in appearance of skin Neurologic: No dizziness, headache, syncope, seizures, numbness, tingling Psychiatric: No significant anxiety, depression, insomnia, anorexia Endocrine: No change in hair/skin/ nails, excessive thirst, excessive hunger, excessive urination  Hematologic/lymphatic: No significant bruising, lymphadenopathy, abnormal bleeding Allergy/immunology: No itchy/watery eyes, significant sneezing, urticaria, angioedema  Physical exam:  Pertinent or positive findings: She has slight esotropia of the left eye. Arcus senilis is present. She is edentulous except for a few lower mandibular teeth. She has some hirsutism of the upper lip and chin. The neck veins appear distended on the right but not the left. Heart sounds are distant & slightly irregular. Breath sounds are decreased. Pedal pulses are decreased. She has 1/2+ edema. The lower extremities are wrapped. She has weakness in all extremities, greater in the lower extremities.  General appearance: Adequately nourished; no acute distress, increased work of breathing is present.   Lymphatic: No lymphadenopathy about the head, neck, axilla. Eyes: No conjunctival inflammation or lid edema is present. There is no scleral icterus. Ears:  External ear exam shows no significant lesions or deformities.   Nose:  External nasal examination shows no deformity or inflammation. Nasal mucosa are pink and moist without lesions, exudates Oral exam:  Lips and gums are healthy appearing. There is no oropharyngeal erythema or exudate. Neck:  No thyromegaly, masses, tenderness noted.    Heart:  No gallop, murmur, click, rub  .  Lungs: without wheezes, rhonchi,rales , rubs. Abdomen:Bowel sounds are normal. Abdomen is soft and nontender  with no organomegaly, hernias,masses. GU: deferred  Extremities:  No cyanosis, clubbing  Neurologic exam : Balance,Rhomberg,finger to nose testing could not be completed due to clinical state Skin: Warm & dry w/o tenting. No significant lesions or rash.  See summary under each active problem in the Problem List with associated updated therapeutic plan

## 2017-07-25 LAB — HEMOGLOBIN A1C: Hemoglobin A1C: 6.2

## 2017-07-25 LAB — BASIC METABOLIC PANEL
BUN: 20 (ref 4–21)
Creatinine: 1.1 (ref 0.5–1.1)
GLUCOSE: 87
Potassium: 4.1 (ref 3.4–5.3)
SODIUM: 144 (ref 137–147)

## 2017-07-28 DIAGNOSIS — I83018 Varicose veins of right lower extremity with ulcer other part of lower leg: Secondary | ICD-10-CM | POA: Diagnosis not present

## 2017-07-28 DIAGNOSIS — I83028 Varicose veins of left lower extremity with ulcer other part of lower leg: Secondary | ICD-10-CM | POA: Diagnosis not present

## 2017-07-30 DIAGNOSIS — F0281 Dementia in other diseases classified elsewhere with behavioral disturbance: Secondary | ICD-10-CM | POA: Diagnosis not present

## 2017-07-30 DIAGNOSIS — R488 Other symbolic dysfunctions: Secondary | ICD-10-CM | POA: Diagnosis not present

## 2017-07-30 DIAGNOSIS — R2681 Unsteadiness on feet: Secondary | ICD-10-CM | POA: Diagnosis not present

## 2017-07-30 DIAGNOSIS — I5030 Unspecified diastolic (congestive) heart failure: Secondary | ICD-10-CM | POA: Diagnosis not present

## 2017-07-30 DIAGNOSIS — M6281 Muscle weakness (generalized): Secondary | ICD-10-CM | POA: Diagnosis not present

## 2017-07-31 DIAGNOSIS — M6281 Muscle weakness (generalized): Secondary | ICD-10-CM | POA: Diagnosis not present

## 2017-07-31 DIAGNOSIS — R488 Other symbolic dysfunctions: Secondary | ICD-10-CM | POA: Diagnosis not present

## 2017-07-31 DIAGNOSIS — F0281 Dementia in other diseases classified elsewhere with behavioral disturbance: Secondary | ICD-10-CM | POA: Diagnosis not present

## 2017-07-31 DIAGNOSIS — R2681 Unsteadiness on feet: Secondary | ICD-10-CM | POA: Diagnosis not present

## 2017-07-31 DIAGNOSIS — I5030 Unspecified diastolic (congestive) heart failure: Secondary | ICD-10-CM | POA: Diagnosis not present

## 2017-08-01 DIAGNOSIS — I5030 Unspecified diastolic (congestive) heart failure: Secondary | ICD-10-CM | POA: Diagnosis not present

## 2017-08-01 DIAGNOSIS — M6281 Muscle weakness (generalized): Secondary | ICD-10-CM | POA: Diagnosis not present

## 2017-08-01 DIAGNOSIS — R488 Other symbolic dysfunctions: Secondary | ICD-10-CM | POA: Diagnosis not present

## 2017-08-01 DIAGNOSIS — F0281 Dementia in other diseases classified elsewhere with behavioral disturbance: Secondary | ICD-10-CM | POA: Diagnosis not present

## 2017-08-01 DIAGNOSIS — R2681 Unsteadiness on feet: Secondary | ICD-10-CM | POA: Diagnosis not present

## 2017-08-04 DIAGNOSIS — M6281 Muscle weakness (generalized): Secondary | ICD-10-CM | POA: Diagnosis not present

## 2017-08-04 DIAGNOSIS — I83018 Varicose veins of right lower extremity with ulcer other part of lower leg: Secondary | ICD-10-CM | POA: Diagnosis not present

## 2017-08-04 DIAGNOSIS — R488 Other symbolic dysfunctions: Secondary | ICD-10-CM | POA: Diagnosis not present

## 2017-08-04 DIAGNOSIS — R2681 Unsteadiness on feet: Secondary | ICD-10-CM | POA: Diagnosis not present

## 2017-08-04 DIAGNOSIS — F0281 Dementia in other diseases classified elsewhere with behavioral disturbance: Secondary | ICD-10-CM | POA: Diagnosis not present

## 2017-08-04 DIAGNOSIS — I5030 Unspecified diastolic (congestive) heart failure: Secondary | ICD-10-CM | POA: Diagnosis not present

## 2017-08-04 DIAGNOSIS — I83028 Varicose veins of left lower extremity with ulcer other part of lower leg: Secondary | ICD-10-CM | POA: Diagnosis not present

## 2017-08-05 DIAGNOSIS — R2681 Unsteadiness on feet: Secondary | ICD-10-CM | POA: Diagnosis not present

## 2017-08-05 DIAGNOSIS — R488 Other symbolic dysfunctions: Secondary | ICD-10-CM | POA: Diagnosis not present

## 2017-08-05 DIAGNOSIS — F0281 Dementia in other diseases classified elsewhere with behavioral disturbance: Secondary | ICD-10-CM | POA: Diagnosis not present

## 2017-08-05 DIAGNOSIS — I5030 Unspecified diastolic (congestive) heart failure: Secondary | ICD-10-CM | POA: Diagnosis not present

## 2017-08-05 DIAGNOSIS — M6281 Muscle weakness (generalized): Secondary | ICD-10-CM | POA: Diagnosis not present

## 2017-08-06 DIAGNOSIS — I5030 Unspecified diastolic (congestive) heart failure: Secondary | ICD-10-CM | POA: Diagnosis not present

## 2017-08-06 DIAGNOSIS — R2681 Unsteadiness on feet: Secondary | ICD-10-CM | POA: Diagnosis not present

## 2017-08-06 DIAGNOSIS — R488 Other symbolic dysfunctions: Secondary | ICD-10-CM | POA: Diagnosis not present

## 2017-08-06 DIAGNOSIS — M6281 Muscle weakness (generalized): Secondary | ICD-10-CM | POA: Diagnosis not present

## 2017-08-06 DIAGNOSIS — F0281 Dementia in other diseases classified elsewhere with behavioral disturbance: Secondary | ICD-10-CM | POA: Diagnosis not present

## 2017-08-07 DIAGNOSIS — R2681 Unsteadiness on feet: Secondary | ICD-10-CM | POA: Diagnosis not present

## 2017-08-07 DIAGNOSIS — I5030 Unspecified diastolic (congestive) heart failure: Secondary | ICD-10-CM | POA: Diagnosis not present

## 2017-08-07 DIAGNOSIS — R488 Other symbolic dysfunctions: Secondary | ICD-10-CM | POA: Diagnosis not present

## 2017-08-07 DIAGNOSIS — F0281 Dementia in other diseases classified elsewhere with behavioral disturbance: Secondary | ICD-10-CM | POA: Diagnosis not present

## 2017-08-07 DIAGNOSIS — M6281 Muscle weakness (generalized): Secondary | ICD-10-CM | POA: Diagnosis not present

## 2017-08-09 DIAGNOSIS — R488 Other symbolic dysfunctions: Secondary | ICD-10-CM | POA: Diagnosis not present

## 2017-08-09 DIAGNOSIS — M6281 Muscle weakness (generalized): Secondary | ICD-10-CM | POA: Diagnosis not present

## 2017-08-09 DIAGNOSIS — R2681 Unsteadiness on feet: Secondary | ICD-10-CM | POA: Diagnosis not present

## 2017-08-09 DIAGNOSIS — I5030 Unspecified diastolic (congestive) heart failure: Secondary | ICD-10-CM | POA: Diagnosis not present

## 2017-08-09 DIAGNOSIS — F0281 Dementia in other diseases classified elsewhere with behavioral disturbance: Secondary | ICD-10-CM | POA: Diagnosis not present

## 2017-08-11 DIAGNOSIS — I83018 Varicose veins of right lower extremity with ulcer other part of lower leg: Secondary | ICD-10-CM | POA: Diagnosis not present

## 2017-08-11 DIAGNOSIS — I83028 Varicose veins of left lower extremity with ulcer other part of lower leg: Secondary | ICD-10-CM | POA: Diagnosis not present

## 2017-08-11 DIAGNOSIS — F0281 Dementia in other diseases classified elsewhere with behavioral disturbance: Secondary | ICD-10-CM | POA: Diagnosis not present

## 2017-08-11 DIAGNOSIS — R488 Other symbolic dysfunctions: Secondary | ICD-10-CM | POA: Diagnosis not present

## 2017-08-11 DIAGNOSIS — R2681 Unsteadiness on feet: Secondary | ICD-10-CM | POA: Diagnosis not present

## 2017-08-11 DIAGNOSIS — M6281 Muscle weakness (generalized): Secondary | ICD-10-CM | POA: Diagnosis not present

## 2017-08-11 DIAGNOSIS — I5030 Unspecified diastolic (congestive) heart failure: Secondary | ICD-10-CM | POA: Diagnosis not present

## 2017-08-12 DIAGNOSIS — F0281 Dementia in other diseases classified elsewhere with behavioral disturbance: Secondary | ICD-10-CM | POA: Diagnosis not present

## 2017-08-12 DIAGNOSIS — R2681 Unsteadiness on feet: Secondary | ICD-10-CM | POA: Diagnosis not present

## 2017-08-12 DIAGNOSIS — R488 Other symbolic dysfunctions: Secondary | ICD-10-CM | POA: Diagnosis not present

## 2017-08-12 DIAGNOSIS — M6281 Muscle weakness (generalized): Secondary | ICD-10-CM | POA: Diagnosis not present

## 2017-08-12 DIAGNOSIS — I5030 Unspecified diastolic (congestive) heart failure: Secondary | ICD-10-CM | POA: Diagnosis not present

## 2017-08-13 DIAGNOSIS — M6281 Muscle weakness (generalized): Secondary | ICD-10-CM | POA: Diagnosis not present

## 2017-08-13 DIAGNOSIS — R488 Other symbolic dysfunctions: Secondary | ICD-10-CM | POA: Diagnosis not present

## 2017-08-13 DIAGNOSIS — R2681 Unsteadiness on feet: Secondary | ICD-10-CM | POA: Diagnosis not present

## 2017-08-13 DIAGNOSIS — F0281 Dementia in other diseases classified elsewhere with behavioral disturbance: Secondary | ICD-10-CM | POA: Diagnosis not present

## 2017-08-13 DIAGNOSIS — I5030 Unspecified diastolic (congestive) heart failure: Secondary | ICD-10-CM | POA: Diagnosis not present

## 2017-08-18 ENCOUNTER — Encounter: Payer: Self-pay | Admitting: Adult Health

## 2017-08-18 ENCOUNTER — Non-Acute Institutional Stay (SKILLED_NURSING_FACILITY): Payer: Medicare Other | Admitting: Adult Health

## 2017-08-18 DIAGNOSIS — E7800 Pure hypercholesterolemia, unspecified: Secondary | ICD-10-CM | POA: Diagnosis not present

## 2017-08-18 DIAGNOSIS — E559 Vitamin D deficiency, unspecified: Secondary | ICD-10-CM | POA: Diagnosis not present

## 2017-08-18 DIAGNOSIS — I5032 Chronic diastolic (congestive) heart failure: Secondary | ICD-10-CM | POA: Diagnosis not present

## 2017-08-18 DIAGNOSIS — I4819 Other persistent atrial fibrillation: Secondary | ICD-10-CM

## 2017-08-18 DIAGNOSIS — I481 Persistent atrial fibrillation: Secondary | ICD-10-CM | POA: Diagnosis not present

## 2017-08-18 DIAGNOSIS — I10 Essential (primary) hypertension: Secondary | ICD-10-CM

## 2017-08-18 DIAGNOSIS — R059 Cough, unspecified: Secondary | ICD-10-CM

## 2017-08-18 DIAGNOSIS — J449 Chronic obstructive pulmonary disease, unspecified: Secondary | ICD-10-CM | POA: Diagnosis not present

## 2017-08-18 DIAGNOSIS — F0390 Unspecified dementia without behavioral disturbance: Secondary | ICD-10-CM | POA: Diagnosis not present

## 2017-08-18 DIAGNOSIS — M1A9XX0 Chronic gout, unspecified, without tophus (tophi): Secondary | ICD-10-CM | POA: Diagnosis not present

## 2017-08-18 DIAGNOSIS — I83028 Varicose veins of left lower extremity with ulcer other part of lower leg: Secondary | ICD-10-CM | POA: Diagnosis not present

## 2017-08-18 DIAGNOSIS — R05 Cough: Secondary | ICD-10-CM

## 2017-08-18 DIAGNOSIS — N183 Chronic kidney disease, stage 3 unspecified: Secondary | ICD-10-CM

## 2017-08-18 DIAGNOSIS — I83018 Varicose veins of right lower extremity with ulcer other part of lower leg: Secondary | ICD-10-CM | POA: Diagnosis not present

## 2017-08-18 NOTE — Progress Notes (Signed)
Location:  [facility] Room Number: 103-A Place of Service:  SNF (31) Provider:  Kenard Gower, NP  Patient Care Team: Pecola Lawless, MD as PCP - General (Internal Medicine) Medina-Vargas, Margit Banda, NP as Nurse Practitioner (Internal Medicine)  Extended Emergency Contact Information Primary Emergency Contact: Rhue-Thomas,Sherill Address: [address] [place], [place] [address] [place] Home Phone: [phone] Relation: Daughter  Code Status:  Full Code  Goals of care: Advanced Directive information Advanced Directives 10/24/2015  Does Patient Have a Medical Advance Directive? No  Would patient like information on creating a medical advance directive? No - patient declined information     Chief Complaint  Patient presents with  . Medical Management of Chronic Issues    Routine [facility] SN[facility] visit    HPI:  Pt is a 77 y.o. female seen today for medical management of chronic diseases.  She is a long-term care resident of [facility].  She has a PMH of persistent atrial fibrillation, essential HTN, chronic diastolic CHF, COPD, stage 3 renal insufficiency, dyslipidemia, dementia, seizure disorder, gout, history of CVA, and history of alcohol abuse. She was seen in her room today. She was noted to have productive cough with whitish grayish phlegm. No reported fever. No noted malodorous smell from wound.     Past Medical History:  Diagnosis Date  . Alcohol dependence in remission (HCC)   . Arthritis   . Cataract   . Chronic anticoagulation    Xarelto  . Chronic diastolic heart failure (HCC)   . CKD (chronic kidney disease), stage III (HCC)   . CVA (cerebral infarction)   . Dementia   . Edema   . Gout   . Hyperlipidemia   . Hypertension   . Murmur   . Osteoarthrosis, generalized, involving multiple sites    INVOLVING RIGHT HIP ,KNEE. AND ANKLE  . Persistent atrial fibrillation (HCC)    a. Dx 04/2016, on anticoag, not felt to be a candidate for amiodarone or cardioversion.  . Seizure disorder (HCC)   . Subclinical hyperthyroidism   . Vitamin D deficiency    History reviewed. No pertinent surgical history.  Allergies  Allergen Reactions  . Penicillins Hives    Outpatient Encounter Medications as of 08/18/2017  Medication Sig  . acetaminophen (TYLENOL) 500 MG tablet Take 1,000 mg by mouth 2 (two) times daily as needed (Arthritic pain).   Marland Kitchen allopurinol (ZYLOPRIM) 300 MG tablet Take 300 mg by mouth daily.  Marland Kitchen atorvastatin (LIPITOR) 40 MG tablet Take 40 mg by mouth daily.  . Cholecalciferol (VITAMIN D3) 50000 units CAPS Take 1 capsule by mouth every 30 (thirty) days.  . colchicine 0.6 MG tablet Take 0.6 mg by mouth daily.  Marland Kitchen diltiazem (CARDIZEM CD) 180 MG 24 hr capsule Take 1 capsule (180 mg total) by mouth daily.  Marland Kitchen donepezil (ARICEPT) 10 MG tablet Take 10 mg by mouth at bedtime.  . fluticasone (FLONASE) 50 MCG/ACT nasal spray Place 1 spray into both nostrils daily.  . furosemide (LASIX) 80 MG tablet Take 80 mg by mouth.  Marland Kitchen ipratropium-albuterol (DUONEB) 0.5-2.5 (3) MG/3ML SOLN Take 3 mLs by nebulization every 4 (four) hours as needed (sob or wheezing).   . metoprolol (TOPROL-XL) 200 MG 24 hr tablet Take 200 mg by mouth daily.   Marland Kitchen NUTRITIONAL SUPPLEMENT LIQD Take 120 mLs by mouth daily. Med[employer]  . potassium chloride SA (K-DUR,KLOR-CON) 20 MEQ tablet Take 40  mEq by mouth daily.   . rivaroxaban (XARELTO) 20 MG TABS tablet Take 20 mg by mouth daily with supper.    No facility-administered encounter medications on file as of 08/18/2017.     Review of Systems  GENERAL: No change in appetite, no fatigue, no weight changes, no fever, chills or weakness MOUTH and THROAT: Denies oral discomfort, gingival pain or bleeding, pain from teeth or hoarseness   RESPIRATORY: +cough CARDIAC: No chest pain, edema or palpitations GI: No abdominal pain, diarrhea, constipation, heart burn,  nausea or vomiting GU: Denies dysuria, frequency, hematuria, incontinence, or discharge PSYCHIATRIC: Denies feelings of depression or anxiety. No report of hallucinations, insomnia, paranoia, or agitation   Immunization History  Administered Date(s) Administered  . Influenza Whole 05/15/2010  . Influenza-Unspecified 02/17/2017  . Pneumococcal Polysaccharide-23 10/25/2015  . Pneumococcal-Unspecified 05/20/2014   Pertinent  Health Maintenance Due  Topic Date Due  . DEXA SCAN  10/11/2003  . PNA vac Low Risk Adult (2 of 2 - PCV13) 10/24/2016  . INFLUENZA VACCINE  12/18/2017    Vitals:   08/18/17 1200  BP: 110/64  Pulse: 80  Resp: 18  Temp: 98.1 F (36.7 C)  TempSrc: Oral  SpO2: 99%  Weight: 171 lb 3.2 oz (77.7 kg)  Height: 5\' 8"  (1.727 m)   Body mass index is 26.03 kg/m.  Physical Exam  GENERAL APPEARANCE: Well nourished. In no acute distress. Normal body habitus SKIN:  Right lower leg lateral side has wound 7.5 X 6 X 0.5 cm with dark red wound base MOUTH and THROAT: Lips are without lesions. Oral mucosa is moist and without lesions. Tongue is normal in shape, size, and color and without lesions RESPIRATORY: Breathing is even & unlabored, + slight wheezing CARDIAC: RRR, no murmur,no extra heart sounds, no edema GI: Abdomen soft, normal BS, no masses, no tenderness, no hepatomegaly, no splenomegaly EXTREMITIES:  Able to move X 4 extremities PSYCHIATRIC: Alert to self and place, disoriented to time.  Affect and behavior are appropriate   Labs reviewed: Recent Labs    12/24/16 0849 12/31/16 1233 07/25/17  NA 143 145* 144  K 4.0 4.1 4.1  CL 107* 106  --   CO2 27 24  --   GLUCOSE 95 100*  --   BUN 21 20 20   CREATININE 1.06* 1.03* 1.1  CALCIUM 9.7 9.4  --   MG 1.6  --   --    Recent Labs    12/24/16 0849  AST 16  ALT 9  ALKPHOS 136*  BILITOT 0.6  PROT 7.0  ALBUMIN 3.8   Recent Labs    12/24/16 0849  WBC 5.6  NEUTROABS 2.8  HGB 13.4  HCT 39.2  MCV 83   PLT 167   Lab Results  Component Value Date   TSH 1.110 12/24/2016   Lab Results  Component Value Date   HGBA1C 6.2 07/25/2017   Lab Results  Component Value Date   CHOL 264 (H) 05/15/2010   HDL 68 05/15/2010   LDLCALC 179 (H) 05/15/2010   TRIG 83 05/15/2010   CHOLHDL 3.9 Ratio 05/15/2010    Assessment/Plan  1. Essential hypertension - continue Diltiazem 24 HR ER 180 mg 1 tab daily, Metoprolol Succinate ER 200 mg daily   2. Persistent atrial fibrillation (HCC) - rate-controlled, continue  Diltiazem 24 HR ER 180 mg 1 tab daily, Metoprolol Succinate ER 200 mg daily, Xarelto 20 mg Q evening   3. COPD  (HCC) -  Has slight wheezing, will  Start Ipratropium-albuterol neb BID X 2 weeks and QID PRN   4. HYPERCHOLESTEROLEMIA - continue Atorvastatin 40 mg 1 tab daily Lab Results  Component Value Date   CHOL 264 (H) 05/15/2010   HDL 68 05/15/2010   LDLCALC 179 (H) 05/15/2010   TRIG 83 05/15/2010   CHOLHDL 3.9 Ratio 05/15/2010     5. Chronic gout without tophus, unspecified cause, unspecified site - continue Colchicine 0.6 mg 1 tab daily   6. Chronic kidney disease, stage III (moderate) (HCC) - will monitor Lab Results  Component Value Date   CREATININE 1.1 07/25/2017     7. Vitamin D deficiency - continue Vitamin D3 50,000 units 1 capsule monthly   8. Cough - will start Robitussin 100 mg/5 ml give 10 ml Q 6AM, 2PM and 10 PM X 2 weeks   9. Dementia without behavioral disturbance -  Continue Donepezil 10 mg 1 tab Q HS, fall precautions  10. Chronic diastolic heart failure - continue Lasix 80 mg 1 tab daily and KCL ER 20 meq 2 tabs daily     Family/ staff Communication:  Discussed plan of care with resident.  Labs/tests ordered:  None  Goals of care:   Long-term care.   Kenard Gower, NP [facility] [phone] (Monday-Friday 8:00 a.m. - 5:00 p.m.) [phone] (after hours)

## 2017-08-22 IMAGING — CR DG CHEST 2V
2 series · 2 of 2 positions shown · non-contrast
Comparison: 10/24/2015

CLINICAL DATA: 75-year-old female with a history of wheezing

EXAM:
CHEST  2 VIEW

[w chest pa]
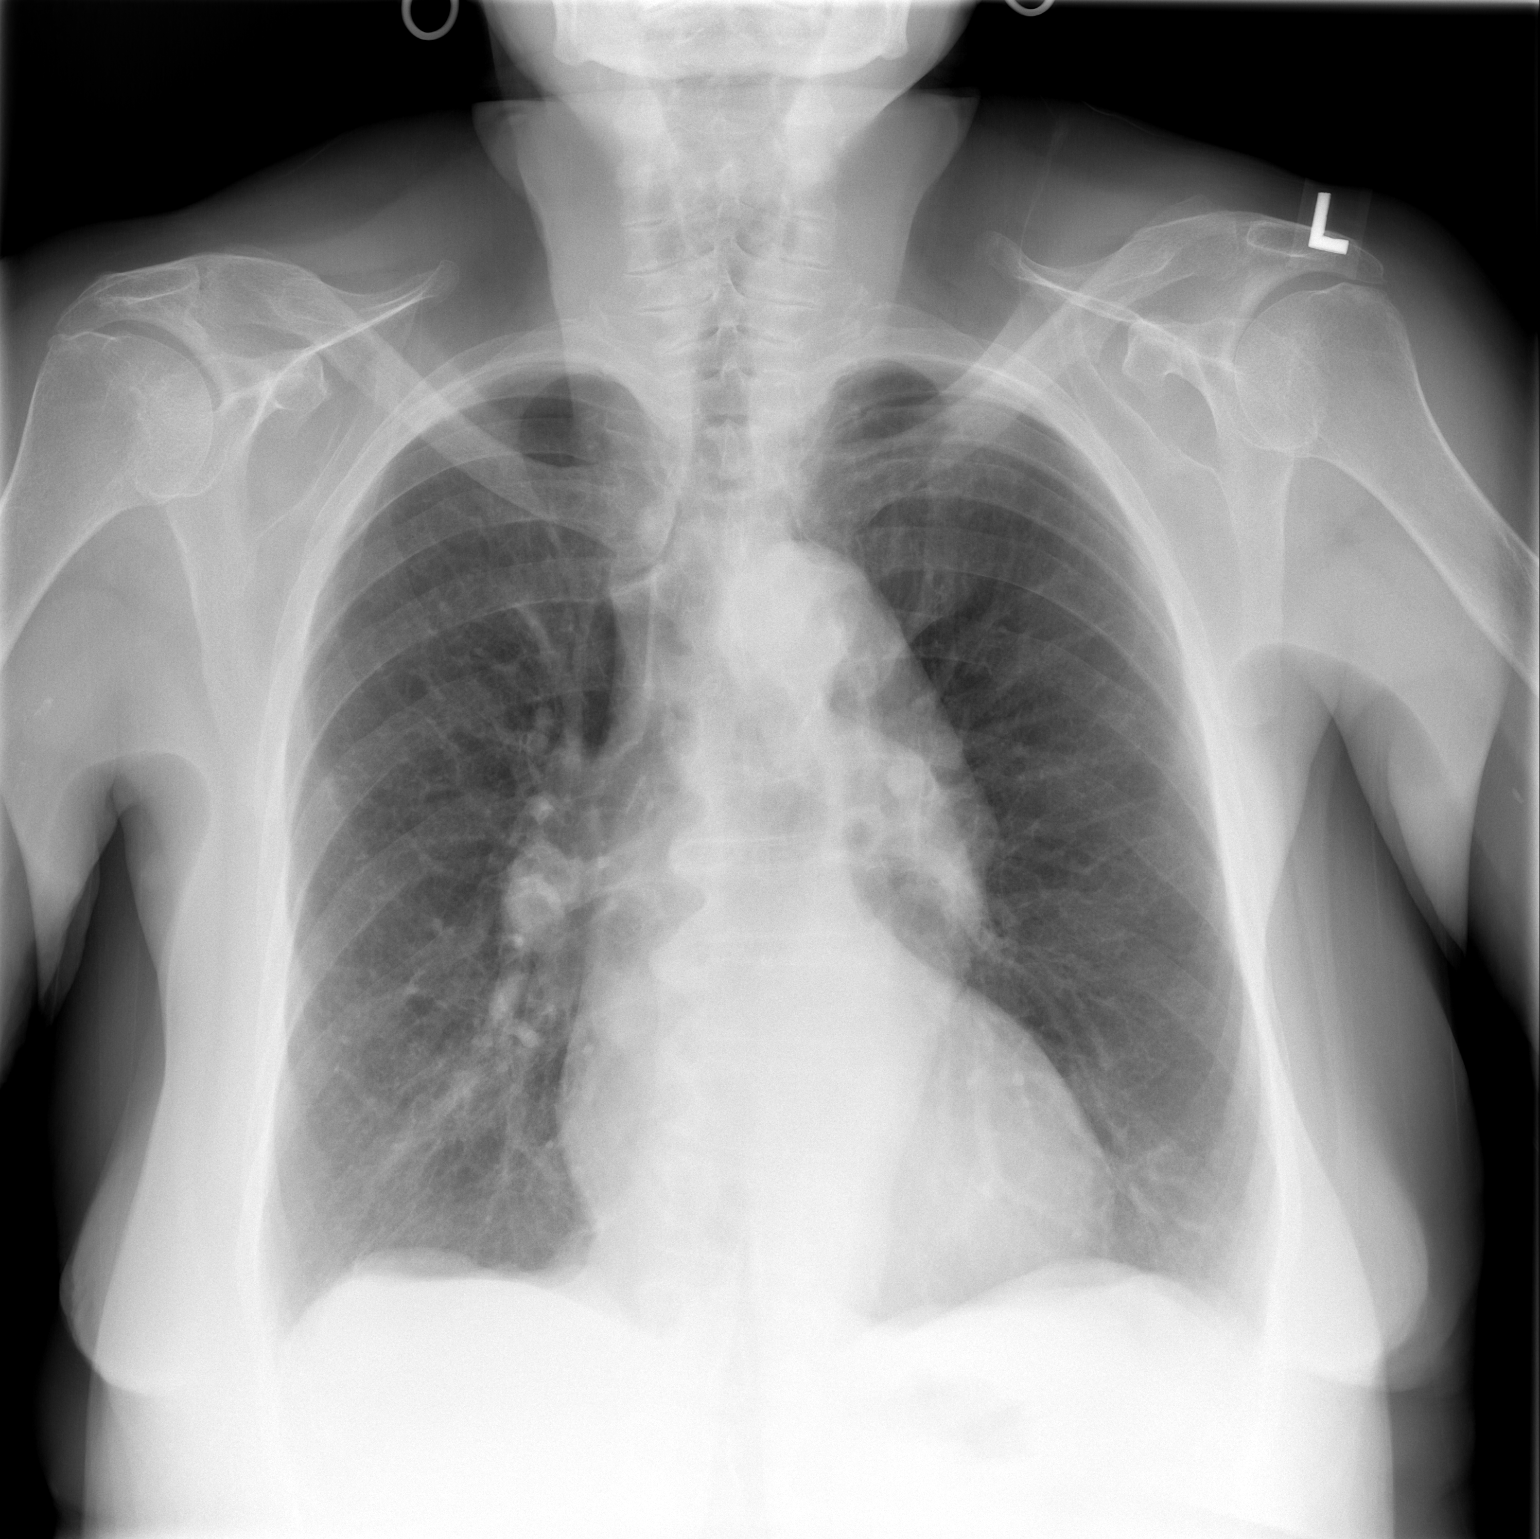

[w chest lat]
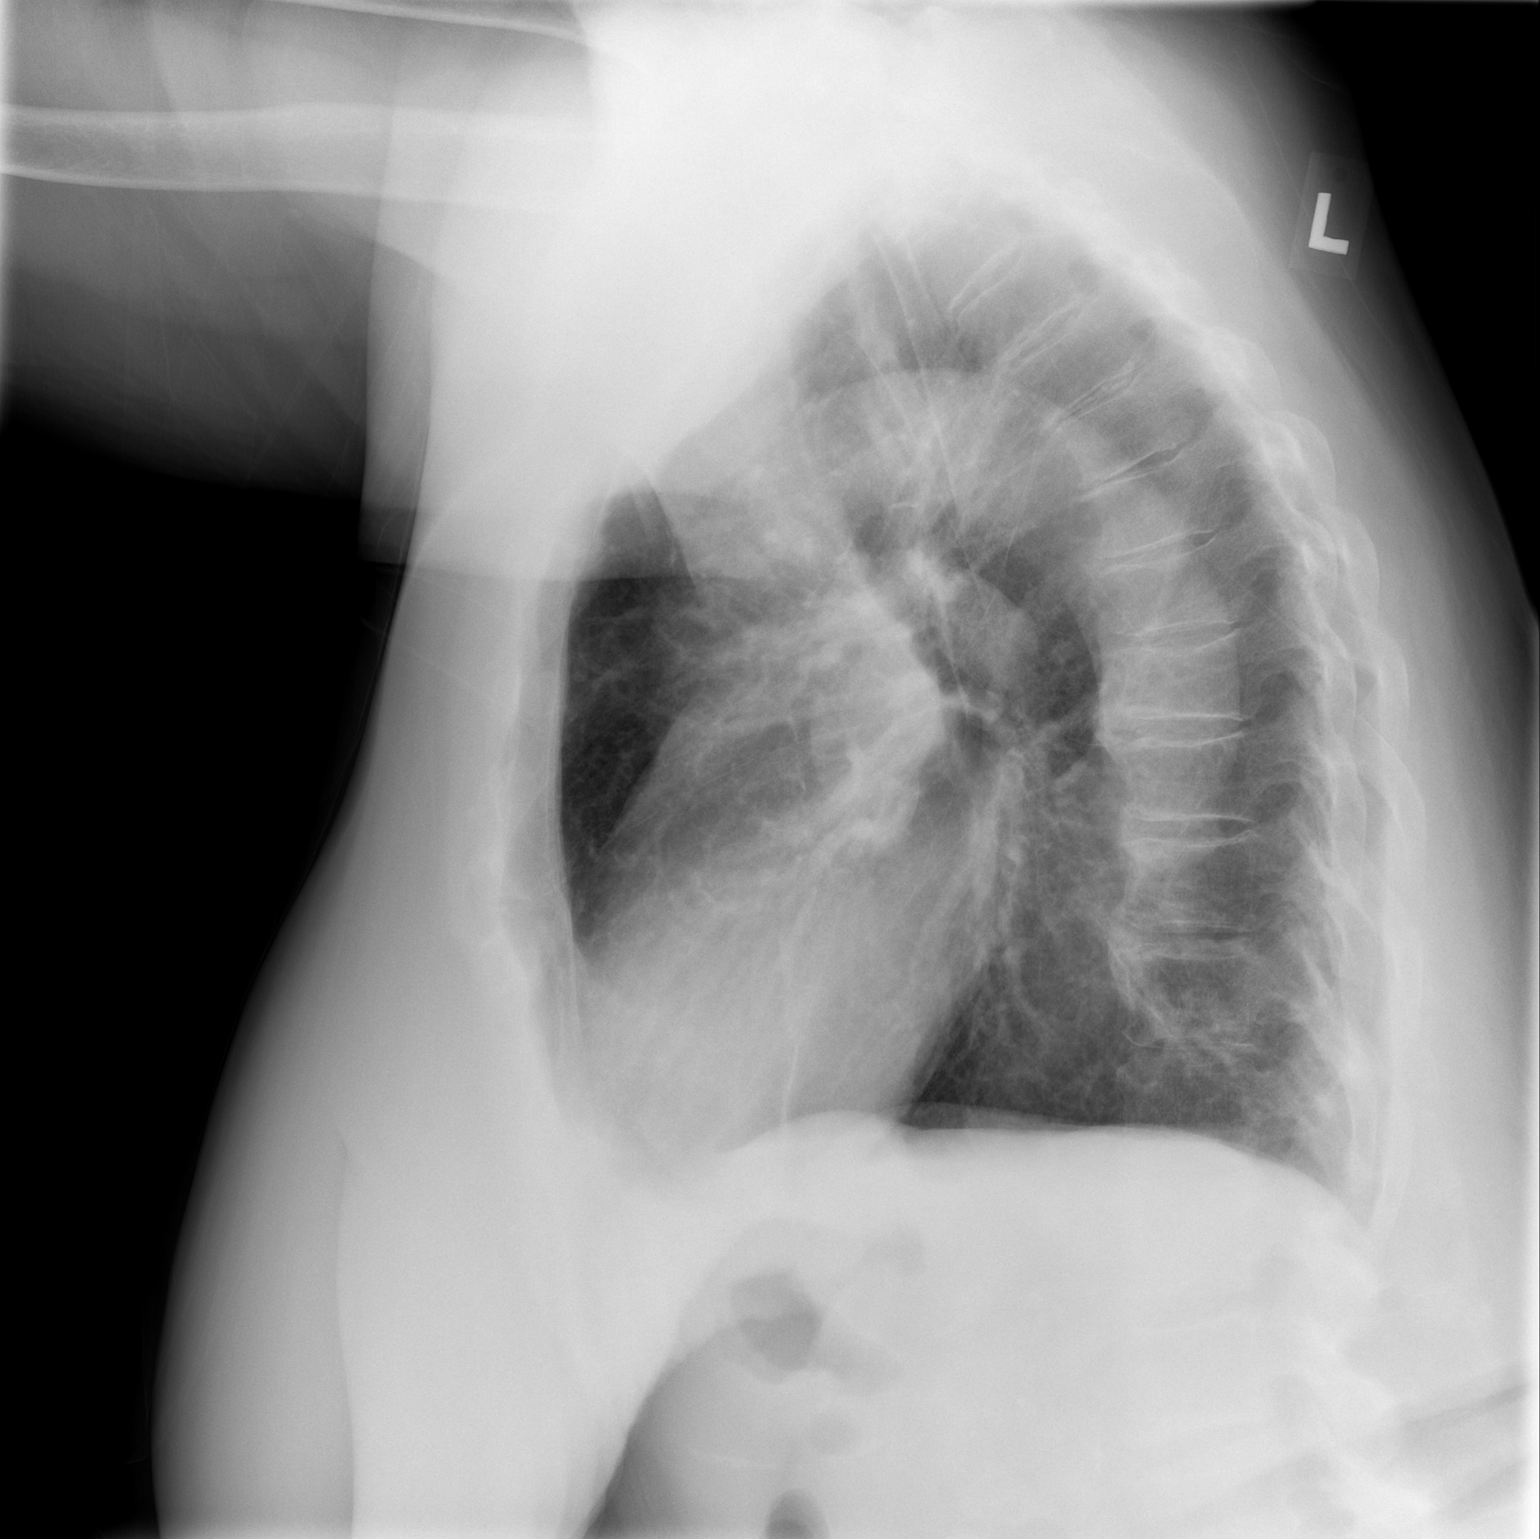

[2 of 2 positions shown; findings below may reference images not displayed]

FINDINGS: Cardiomediastinal silhouette unchanged. No evidence of central
vascular congestion. No interlobular septal thickening.

Improved aeration compared to the prior plain film with resolution
of airspace opacity at the right base. No pleural effusion. No new
confluent airspace disease. No pneumothorax.

Stigmata of emphysema, with increased retrosternal airspace,
flattened hemidiaphragms, increased AP diameter, and hyperinflation
on the AP view.
IMPRESSION: Background of chronic lung changes and emphysema with no evidence of
superimposed acute cardiopulmonary disease.

Airspace opacity at the right base on the prior plain film has
resolved.

## 2017-08-25 DIAGNOSIS — I83018 Varicose veins of right lower extremity with ulcer other part of lower leg: Secondary | ICD-10-CM | POA: Diagnosis not present

## 2017-08-25 DIAGNOSIS — I83028 Varicose veins of left lower extremity with ulcer other part of lower leg: Secondary | ICD-10-CM | POA: Diagnosis not present

## 2017-08-28 ENCOUNTER — Non-Acute Institutional Stay (SKILLED_NURSING_FACILITY): Payer: Medicare Other

## 2017-08-28 DIAGNOSIS — Z0000 Encounter for general adult medical examination without abnormal findings: Secondary | ICD-10-CM | POA: Diagnosis not present

## 2017-08-28 NOTE — Progress Notes (Addendum)
Subjective:   Regina Sandoval is a 77 y.o. female who presents for Medicare Annual (Initial) preventive examination at [facility]     Objective:     Vitals: BP 118/60 (BP Location: Left Arm, Patient Position: Supine)   Pulse 68   Temp 97.9 F (36.6 C) (Oral)   Ht 5\' 8"  (1.727 m)   Wt 171 lb (77.6 kg)   SpO2 93%   BMI 26.00 kg/m   Body mass index is 26 kg/m.  Advanced Directives 08/28/2017 10/24/2015 10/23/2015  Does Patient Have a Medical Advance Directive? No No No  Would patient like information on creating a medical advance directive? No - Patient declined No - patient declined information -    Tobacco Social History   Tobacco Use  Smoking Status Former Smoker  . Packs/day: 1.00  . Years: 50.00  . Pack years: 50.00  . Types: Cigarettes  Smokeless Tobacco Never Used     Counseling given: Not Answered   Clinical Intake:  Pre-visit preparation completed: No  Pain : No/denies pain     Nutritional Risks: None Diabetes: No  How often do you need to have someone help you when you read instructions, pamphlets, or other written materials from your doctor or pharmacy?: 2 - Rarely  Interpreter Needed?: No  Information entered by :: Tyron RussellSara Trayquan Kolakowski, RN  Past Medical History:  Diagnosis Date  . Alcohol dependence in remission (HCC)   . Arthritis   . Cataract   . Chronic anticoagulation    Xarelto  . Chronic diastolic heart failure (HCC)   . CKD (chronic kidney disease), stage III (HCC)   . CVA (cerebral infarction)   . Dementia   . Edema   . Gout   . Hyperlipidemia   . Hypertension   . Murmur   . Osteoarthrosis, generalized, involving multiple sites    INVOLVING RIGHT HIP ,KNEE. AND ANKLE  . Persistent atrial fibrillation (HCC)    a. Dx 04/2016, on anticoag, not felt to be a candidate for amiodarone or cardioversion.  . Seizure disorder (HCC)   . Subclinical hyperthyroidism   . Vitamin D deficiency    No past surgical history on file. Family  History  Problem Relation Age of Onset  . Asthma Grandchild   . Hypertension Father   . Diabetes Daughter    Social History   Socioeconomic History  . Marital status: Divorced    Spouse name: Not on file  . Number of children: Not on file  . Years of education: Not on file  . Highest education level: Not on file  Occupational History  . Not on file  Social Needs  . Financial resource strain: Not hard at all  . Food insecurity:    Worry: Never true    Inability: Never true  . Transportation needs:    Medical: No    Non-medical: No  Tobacco Use  . Smoking status: Former Smoker    Packs/day: 1.00    Years: 50.00    Pack years: 50.00    Types: Cigarettes  . Smokeless tobacco: Never Used  Substance and Sexual Activity  . Alcohol use: No    Alcohol/week: 0.0 oz  . Drug use: No  . Sexual activity: Not on file  Lifestyle  . Physical activity:    Days per week: 0 days    Minutes per session: 0 min  . Stress: Only a little  Relationships  . Social connections:    Talks on phone: Once  a week    Gets together: Once a week    Attends religious service: Never    Active member of club or organization: No    Attends meetings of clubs or organizations: Never    Relationship status: Divorced  Other Topics Concern  . Not on file  Social History Narrative   Lived with daughter at home prior to SNF placement    Outpatient Encounter Medications as of 08/28/2017  Medication Sig  . acetaminophen (TYLENOL) 500 MG tablet Take 1,000 mg by mouth 2 (two) times daily as needed (Arthritic pain).   Marland Kitchen allopurinol (ZYLOPRIM) 300 MG tablet Take 300 mg by mouth daily.  Marland Kitchen atorvastatin (LIPITOR) 40 MG tablet Take 40 mg by mouth daily.  . Cholecalciferol (VITAMIN D3) 50000 units CAPS Take 1 capsule by mouth every 30 (thirty) days.  . colchicine 0.6 MG tablet Take 0.6 mg by mouth daily.  Marland Kitchen diltiazem (CARDIZEM CD) 180 MG 24 hr capsule Take 1 capsule (180 mg total) by mouth daily.  Marland Kitchen donepezil  (ARICEPT) 10 MG tablet Take 10 mg by mouth at bedtime.  . fluticasone (FLONASE) 50 MCG/ACT nasal spray Place 1 spray into both nostrils daily.  . furosemide (LASIX) 80 MG tablet Take 80 mg by mouth.  Marland Kitchen ipratropium-albuterol (DUONEB) 0.5-2.5 (3) MG/3ML SOLN Take 3 mLs by nebulization every 4 (four) hours as needed (sob or wheezing).   . metoprolol (TOPROL-XL) 200 MG 24 hr tablet Take 200 mg by mouth daily.   Marland Kitchen NUTRITIONAL SUPPLEMENT LIQD Take 120 mLs by mouth daily. MedPass  . potassium chloride SA (K-DUR,KLOR-CON) 20 MEQ tablet Take 40 mEq by mouth daily.   . rivaroxaban (XARELTO) 20 MG TABS tablet Take 20 mg by mouth daily with supper.    No facility-administered encounter medications on file as of 08/28/2017.     Activities of Daily Living In your present state of health, do you have any difficulty performing the following activities: 08/28/2017  Hearing? N  Vision? N  Difficulty concentrating or making decisions? Y  Walking or climbing stairs? Y  Dressing or bathing? N  Doing errands, shopping? Y  Preparing Food and eating ? N  Using the Toilet? N  In the past six months, have you accidently leaked urine? Y  Do you have problems with loss of bowel control? Y  Managing your Medications? Y  Managing your Finances? Y  Housekeeping or managing your Housekeeping? Y  Some recent data might be hidden    Patient Care Team: Pecola Lawless, MD as PCP - General (Internal Medicine) Medina-Vargas, Margit Banda, NP as Nurse Practitioner (Internal Medicine)    Assessment:   This is a routine wellness examination for Regina Sandoval.  Exercise Activities and Dietary recommendations Current Exercise Habits: The patient does not participate in regular exercise at present, Exercise limited by: neurologic condition(s)  Goals    None      Fall Risk Fall Risk  08/28/2017  Falls in the past year? No   Is the patient's home free of loose throw rugs in walkways, pet beds, electrical cords, etc?   yes       Grab bars in the bathroom? yes      Handrails on the stairs?   yes      Adequate lighting?   yes   Depression Screen PHQ 2/9 Scores 08/28/2017  PHQ - 2 Score 0     Cognitive Function     6CIT Screen 08/28/2017  What Year? 4 points  What month?  3 points  What time? 0 points  Count back from 20 0 points  Months in reverse 2 points  Repeat phrase 6 points  Total Score 15    Immunization History  Administered Date(s) Administered  . Influenza Whole 05/15/2010  . Influenza-Unspecified 02/17/2017  . Pneumococcal Polysaccharide-23 10/25/2015  . Pneumococcal-Unspecified 05/20/2014    Qualifies for Shingles Vaccine? Not in past records  Screening Tests Health Maintenance  Topic Date Due  . DEXA SCAN  10/11/2003  . PNA vac Low Risk Adult (2 of 2 - PCV13) 10/24/2016  . TETANUS/TDAP  07/25/2018 (Originally 10/10/1959)  . INFLUENZA VACCINE  12/18/2017    Cancer Screenings: Lung: Low Dose CT Chest recommended if Age 68-80 years, 30 pack-year currently smoking OR have quit w/in 15years. Patient does qualify. Breast:  Up to date on Mammogram? Yes   Up to date of Bone Density/Dexa? No. ordered Colorectal: up to date  Additional Screenings: Hepatitis C Screening: declined TDAP due, ordered     Plan:    I have personally reviewed and addressed the Medicare Annual Wellness questionnaire and have noted the following in the patient's chart:  A. Medical and social history B. Use of alcohol, tobacco or illicit drugs  C. Current medications and supplements D. Functional ability and status E.  Nutritional status F.  Physical activity G. Advance directives H. List of other physicians I.  Hospitalizations, surgeries, and ER visits in previous 12 months J.  Vitals K. Screenings to include hearing, vision, cognitive, depression L. Referrals and appointments - none  In addition, I have reviewed and discussed with patient certain preventive protocols, quality metrics, and best  practice recommendations. A written personalized care plan for preventive services as well as general preventive health recommendations were provided to patient.  See attached scanned questionnaire for additional information.   Signed,   Tyron Russell, RN Nurse Health Advisor  Patient Concerns: None  I have personally reviewed the health advisor's clinical note, was available for consultation, and agree with the assessment and plan as written. Pecola Lawless M.D., FACP, [facility]

## 2017-08-28 NOTE — Patient Instructions (Signed)
Regina Sandoval , Thank you for taking time to come for your Medicare Wellness Visit. I appreciate your ongoing commitment to your health goals. Please review the following plan we discussed and let me know if I can assist you in the future.   Screening recommendations/referrals: Colonoscopy excluded, pt is over age 79 Mammogram excluded, pt is over age 79 Bone Density due, ordered Recommended yearly ophthalmology/optometry visit for glaucoma screening and checkup Recommended yearly dental visit for hygiene and checkup  Vaccinations: Influenza vaccine up to date, due 2019 fall season Pneumococcal vaccine up to date, completed Tdap vaccine due, ordered Shingles vaccine not in past records    Advanced directives: Need a copy for chart  Conditions/risks identified: none  Next appointment: Dr. Alwyn RenHopper makes rounds   Preventive Care 65 Years and Older, Female Preventive care refers to lifestyle choices and visits with your health care provider that can promote health and wellness. What does preventive care include?  A yearly physical exam. This is also called an annual well check.  Dental exams once or twice a year.  Routine eye exams. Ask your health care provider how often you should have your eyes checked.  Personal lifestyle choices, including:  Daily care of your teeth and gums.  Regular physical activity.  Eating a healthy diet.  Avoiding tobacco and drug use.  Limiting alcohol use.  Practicing safe sex.  Taking low-dose aspirin every day.  Taking vitamin and mineral supplements as recommended by your health care provider. What happens during an annual well check? The services and screenings done by your health care provider during your annual well check will depend on your age, overall health, lifestyle risk factors, and family history of disease. Counseling  Your health care provider may ask you questions about your:  Alcohol use.  Tobacco use.  Drug  use.  Emotional well-being.  Home and relationship well-being.  Sexual activity.  Eating habits.  History of falls.  Memory and ability to understand (cognition).  Work and work Astronomerenvironment.  Reproductive health. Screening  You may have the following tests or measurements:  Height, weight, and BMI.  Blood pressure.  Lipid and cholesterol levels. These may be checked every 5 years, or more frequently if you are over 79 years old.  Skin check.  Lung cancer screening. You may have this screening every year starting at age 79 if you have a 30-pack-year history of smoking and currently smoke or have quit within the past 15 years.  Fecal occult blood test (FOBT) of the stool. You may have this test every year starting at age 79.  Flexible sigmoidoscopy or colonoscopy. You may have a sigmoidoscopy every 5 years or a colonoscopy every 10 years starting at age 79.  Hepatitis C blood test.  Hepatitis B blood test.  Sexually transmitted disease (STD) testing.  Diabetes screening. This is done by checking your blood sugar (glucose) after you have not eaten for a while (fasting). You may have this done every 1-3 years.  Bone density scan. This is done to screen for osteoporosis. You may have this done starting at age 79.  Mammogram. This may be done every 1-2 years. Talk to your health care provider about how often you should have regular mammograms. Talk with your health care provider about your test results, treatment options, and if necessary, the need for more tests. Vaccines  Your health care provider may recommend certain vaccines, such as:  Influenza vaccine. This is recommended every year.  Tetanus, diphtheria, and  acellular pertussis (Tdap, Td) vaccine. You may need a Td booster every 10 years.  Zoster vaccine. You may need this after age 58.  Pneumococcal 13-valent conjugate (PCV13) vaccine. One dose is recommended after age 59.  Pneumococcal polysaccharide  (PPSV23) vaccine. One dose is recommended after age 13. Talk to your health care provider about which screenings and vaccines you need and how often you need them. This information is not intended to replace advice given to you by your health care provider. Make sure you discuss any questions you have with your health care provider. Document Released: 06/02/2015 Document Revised: 01/24/2016 Document Reviewed: 03/07/2015 Elsevier Interactive Patient Education  2017 [place] Prevention in the Home Falls can cause injuries. They can happen to people of all ages. There are many things you can do to make your home safe and to help prevent falls. What can I do on the outside of my home?  Regularly fix the edges of walkways and driveways and fix any cracks.  Remove anything that might make you trip as you walk through a door, such as a raised step or threshold.  Trim any bushes or trees on the path to your home.  Use bright outdoor lighting.  Clear any walking paths of anything that might make someone trip, such as rocks or tools.  Regularly check to see if handrails are loose or broken. Make sure that both sides of any steps have handrails.  Any raised decks and porches should have guardrails on the edges.  Have any leaves, snow, or ice cleared regularly.  Use sand or salt on walking paths during winter.  Clean up any spills in your garage right away. This includes oil or grease spills. What can I do in the bathroom?  Use night lights.  Install grab bars by the toilet and in the tub and shower. Do not use towel bars as grab bars.  Use non-skid mats or decals in the tub or shower.  If you need to sit down in the shower, use a plastic, non-slip stool.  Keep the floor dry. Clean up any water that spills on the floor as soon as it happens.  Remove soap buildup in the tub or shower regularly.  Attach bath mats securely with double-sided non-slip rug tape.  Do not have  throw rugs and other things on the floor that can make you trip. What can I do in the bedroom?  Use night lights.  Make sure that you have a light by your bed that is easy to reach.  Do not use any sheets or blankets that are too big for your bed. They should not hang down onto the floor.  Have a firm chair that has side arms. You can use this for support while you get dressed.  Do not have throw rugs and other things on the floor that can make you trip. What can I do in the kitchen?  Clean up any spills right away.  Avoid walking on wet floors.  Keep items that you use a lot in easy-to-reach places.  If you need to reach something above you, use a strong step stool that has a grab bar.  Keep electrical cords out of the way.  Do not use floor polish or wax that makes floors slippery. If you must use wax, use non-skid floor wax.  Do not have throw rugs and other things on the floor that can make you trip. What can I do with my stairs?  Do not leave any items on the stairs.  Make sure that there are handrails on both sides of the stairs and use them. Fix handrails that are broken or loose. Make sure that handrails are as long as the stairways.  Check any carpeting to make sure that it is firmly attached to the stairs. Fix any carpet that is loose or worn.  Avoid having throw rugs at the top or bottom of the stairs. If you do have throw rugs, attach them to the floor with carpet tape.  Make sure that you have a light switch at the top of the stairs and the bottom of the stairs. If you do not have them, ask someone to add them for you. What else can I do to help prevent falls?  Wear shoes that:  Do not have high heels.  Have rubber bottoms.  Are comfortable and fit you well.  Are closed at the toe. Do not wear sandals.  If you use a stepladder:  Make sure that it is fully opened. Do not climb a closed stepladder.  Make sure that both sides of the stepladder are  locked into place.  Ask someone to hold it for you, if possible.  Clearly mark and make sure that you can see:  Any grab bars or handrails.  First and last steps.  Where the edge of each step is.  Use tools that help you move around (mobility aids) if they are needed. These include:  Canes.  Walkers.  Scooters.  Crutches.  Turn on the lights when you go into a dark area. Replace any light bulbs as soon as they burn out.  Set up your furniture so you have a clear path. Avoid moving your furniture around.  If any of your floors are uneven, fix them.  If there are any pets around you, be aware of where they are.  Review your medicines with your doctor. Some medicines can make you feel dizzy. This can increase your chance of falling. Ask your doctor what other things that you can do to help prevent falls. This information is not intended to replace advice given to you by your health care provider. Make sure you discuss any questions you have with your health care provider. Document Released: 03/02/2009 Document Revised: 10/12/2015 Document Reviewed: 06/10/2014 Elsevier Interactive Patient Education  2017 [employer].

## 2017-09-01 DIAGNOSIS — I83018 Varicose veins of right lower extremity with ulcer other part of lower leg: Secondary | ICD-10-CM | POA: Diagnosis not present

## 2017-09-01 DIAGNOSIS — I83028 Varicose veins of left lower extremity with ulcer other part of lower leg: Secondary | ICD-10-CM | POA: Diagnosis not present

## 2017-09-08 DIAGNOSIS — I83018 Varicose veins of right lower extremity with ulcer other part of lower leg: Secondary | ICD-10-CM | POA: Diagnosis not present

## 2017-09-08 DIAGNOSIS — I83028 Varicose veins of left lower extremity with ulcer other part of lower leg: Secondary | ICD-10-CM | POA: Diagnosis not present

## 2017-09-15 DIAGNOSIS — I83018 Varicose veins of right lower extremity with ulcer other part of lower leg: Secondary | ICD-10-CM | POA: Diagnosis not present

## 2017-09-15 DIAGNOSIS — I83028 Varicose veins of left lower extremity with ulcer other part of lower leg: Secondary | ICD-10-CM | POA: Diagnosis not present

## 2017-09-18 ENCOUNTER — Non-Acute Institutional Stay (SKILLED_NURSING_FACILITY): Payer: Medicare Other | Admitting: Adult Health

## 2017-09-18 ENCOUNTER — Encounter: Payer: Self-pay | Admitting: Adult Health

## 2017-09-18 DIAGNOSIS — J449 Chronic obstructive pulmonary disease, unspecified: Secondary | ICD-10-CM

## 2017-09-18 DIAGNOSIS — E876 Hypokalemia: Secondary | ICD-10-CM | POA: Diagnosis not present

## 2017-09-18 DIAGNOSIS — M1A9XX0 Chronic gout, unspecified, without tophus (tophi): Secondary | ICD-10-CM

## 2017-09-18 DIAGNOSIS — E7800 Pure hypercholesterolemia, unspecified: Secondary | ICD-10-CM

## 2017-09-18 DIAGNOSIS — I5032 Chronic diastolic (congestive) heart failure: Secondary | ICD-10-CM | POA: Diagnosis not present

## 2017-09-18 DIAGNOSIS — I10 Essential (primary) hypertension: Secondary | ICD-10-CM

## 2017-09-18 DIAGNOSIS — E559 Vitamin D deficiency, unspecified: Secondary | ICD-10-CM

## 2017-09-18 DIAGNOSIS — I481 Persistent atrial fibrillation: Secondary | ICD-10-CM | POA: Diagnosis not present

## 2017-09-18 DIAGNOSIS — I4819 Other persistent atrial fibrillation: Secondary | ICD-10-CM

## 2017-09-18 DIAGNOSIS — F0390 Unspecified dementia without behavioral disturbance: Secondary | ICD-10-CM

## 2017-09-18 NOTE — Progress Notes (Signed)
Location:  [facility] Room Number: 103-A Place of Service:  SNF (31) Provider:  Kenard Gower, NP  Patient Care Team: Pecola Lawless, MD as PCP - General (Internal Medicine) Medina-Vargas, Margit Banda, NP as Nurse Practitioner (Internal Medicine)  Extended Emergency Contact Information Primary Emergency Contact: Mellor-Thomas,Sherill Address: [address] [place], [place] [address] [place] Home Phone: [phone] Relation: Daughter  Code Status:  Full Code  Goals of care: Advanced Directive information Advanced Directives 08/28/2017  Does Patient Have a Medical Advance Directive? No  Would patient like information on creating a medical advance directive? No - Patient declined     Chief Complaint  Patient presents with  . Medical Management of Chronic Issues    Routine [facility] visit    HPI:  Pt is a 77 y.o. female seen today for medical management of chronic diseases.  She is a long-term care resident of [facility].  She has a PMH of persistent atrial fibrillation, essential HTN, chronic diastolic CHF, COPD, stage III renal insufficiency, dyslipidemia, dementia, seizure disorder, gout, history of CVA, and history of alcohol abuse. BPs are well controlled- 131/68, 106/89, 129/77.   Past Medical History:  Diagnosis Date  . Alcohol dependence in remission (HCC)   . Arthritis   . Cataract   . Chronic anticoagulation    Xarelto  . Chronic diastolic heart failure (HCC)   . CKD (chronic kidney disease), stage III (HCC)   . CVA (cerebral infarction)   . Dementia   . Edema   . Gout   . Hyperlipidemia   . Hypertension   . Murmur   . Osteoarthrosis, generalized, involving multiple sites    INVOLVING RIGHT HIP ,KNEE. AND ANKLE  . Persistent atrial fibrillation (HCC)    a. Dx 04/2016, on anticoag, not felt to be a candidate for amiodarone or cardioversion.  . Seizure disorder (HCC)   .  Subclinical hyperthyroidism   . Vitamin D deficiency    History reviewed. No pertinent surgical history.  Allergies  Allergen Reactions  . Penicillins Hives    Outpatient Encounter Medications as of 09/18/2017  Medication Sig  . acetaminophen (TYLENOL) 500 MG tablet Take 1,000 mg by mouth 2 (two) times daily as needed (Arthritic pain).   Marland Kitchen allopurinol (ZYLOPRIM) 300 MG tablet Take 300 mg by mouth daily.  Marland Kitchen atorvastatin (LIPITOR) 40 MG tablet Take 40 mg by mouth daily.  . Cholecalciferol (VITAMIN D3) 50000 units CAPS Take 1 capsule by mouth every 30 (thirty) days.  . colchicine 0.6 MG tablet Take 0.6 mg by mouth daily.  Marland Kitchen diltiazem (CARDIZEM CD) 180 MG 24 hr capsule Take 1 capsule (180 mg total) by mouth daily.  Marland Kitchen donepezil (ARICEPT) 10 MG tablet Take 10 mg by mouth at bedtime.  . fluticasone (FLONASE) 50 MCG/ACT nasal spray Place 1 spray into both nostrils daily.  . furosemide (LASIX) 80 MG tablet Take 80 mg by mouth.  Marland Kitchen ipratropium-albuterol (DUONEB) 0.5-2.5 (3) MG/3ML SOLN Take 3 mLs by nebulization 4 (four) times daily as needed (sob or wheezing).   . metoprolol (TOPROL-XL) 200 MG 24 hr tablet Take 200 mg by mouth daily.   Marland Kitchen NUTRITIONAL SUPPLEMENT LIQD Take 120 mLs by mouth daily. MedPass  . potassium chloride SA (K-DUR,KLOR-CON) 20 MEQ tablet Take 40 mEq by mouth daily.   . rivaroxaban (XARELTO) 20 MG TABS tablet Take 20 mg by mouth daily with supper.  No facility-administered encounter medications on file as of 09/18/2017.     Review of Systems  GENERAL: No change in appetite, no fatigue, no weight changes, no fever, chills or weakness MOUTH and THROAT: Denies oral discomfort, gingival pain or bleeding, pain from teeth or hoarseness   RESPIRATORY: no cough, SOB, DOE, wheezing, hemoptysis CARDIAC: No chest pain, edema or palpitations GI: No abdominal pain, diarrhea, constipation, heart burn, nausea or vomiting GU: Denies dysuria, frequency, hematuria, incontinence, or  discharge PSYCHIATRIC: Denies feelings of depression or anxiety. No report of hallucinations, insomnia, paranoia, or agitation.    Immunization History  Administered Date(s) Administered  . Influenza Whole 05/15/2010  . Influenza-Unspecified 02/17/2017  . Pneumococcal Polysaccharide-23 10/25/2015  . Pneumococcal-Unspecified 05/20/2014   Pertinent  Health Maintenance Due  Topic Date Due  . DEXA SCAN  10/11/2003  . PNA vac Low Risk Adult (2 of 2 - PCV13) 10/24/2016  . INFLUENZA VACCINE  12/18/2017   Fall Risk  08/28/2017  Falls in the past year? No      Vitals:   09/18/17 1234  BP: 129/77  Pulse: 74  Resp: 18  Temp: 97.7 F (36.5 C)  TempSrc: Oral  SpO2: 96%  Weight: 164 lb 3.2 oz (74.5 kg)  Height:  (1.727 m)   Body mass index is 24.97 kg/m.  Physical Exam  GENERAL APPEARANCE: Well nourished. In no acute distress. Normal body habitus SKIN:  Left shin venous ulcer measuring 6 X 4.2 X 0.2 cm with 90% mixed  Pale dark red wound base, 10% tan slough, minimal serosanguinous drainage MOUTH and THROAT: Lips are without lesions. Oral mucosa is moist and without lesions. Tongue is normal in shape, size, and color and without lesions RESPIRATORY: Breathing is even & unlabored, BS CTAB CARDIAC: RRR, no murmur, no extra heart sounds, no edema GI: Abdomen soft, normal BS, no masses, no tenderness EXTREMITIES:  Able to move X 4 extremities, ambulates with walker PSYCHIATRIC: Alert and oriented X 3. Affect and behavior are appropriate   Labs reviewed: Recent Labs    12/24/16 0849 12/31/16 1233 07/25/17  NA 143 145* 144  K 4.0 4.1 4.1  CL 107* 106  --   CO2 27 24  --   GLUCOSE 95 100*  --   BUN CREATININE 1.06* 1.03* 1.1  CALCIUM 9.7 9.4  --   MG 1.6  --   --    Recent Labs    12/24/16 0849  AST 16  ALT 9  ALKPHOS 136*  BILITOT 0.6  PROT 7.0  ALBUMIN 3.8   Recent Labs    12/24/16 0849  WBC 5.6  NEUTROABS 2.8  HGB 13.4  HCT 39.2  MCV 83   PLT 167   Lab Results  Component Value Date   TSH 1.110 12/24/2016   Lab Results  Component Value Date   HGBA1C 6.2 07/25/2017   Lab Results  Component Value Date   CHOL 264 (H) 05/15/2010   HDL 68 05/15/2010   LDLCALC 179 (H) 05/15/2010   TRIG 83 05/15/2010   CHOLHDL 3.9 Ratio 05/15/2010    Assessment/Plan  1. Chronic obstructive pulmonary disease, unspecified COPD type (HCC) - no SOB nor wheezing, continue Ipratropium-Albuterol PRN   2. HYPERCHOLESTEROLEMIA - continue Atorvastatin 40 mg daily, check lipid panel   3. Essential hypertension - continue Diltiazem 24HR 180 mg 1 tab daily, Metoprolol Succinate ER 200 mg 1 tab daily   4. Persistent atrial fibrillation (HCC)  - rate-controlled, continue  Diltiazem 24HR 180 mg 1 tab daily, Metoprolol Succinate ER 200 mg 1 tab daily   5. Chronic gout without tophus, unspecified cause, unspecified site - stable, continue Allopurinol 300 mg 1 tab daily, Colchicine 0.6 mg 1 tab daily   6. Vitamin D deficiency - continue Vitamin D3 50,000 units 1 capsule once a month   7. Hypokalemia - continue KCL ER 20 meq 2 tabs = 40 meq daily Lab Results  Component Value Date   K 4.1 07/25/2017     8. Dementia without behavioral disturbance, unspecified dementia type - continue supportive care, fall precautions, continue Donepezil 10 mg 1 tab Q HS, check CBC   9. Chronic diastolic CHF -  No SOB, continue Lasix 80 mg 1 tab daily, check CMP    Family/ staff Communication: Discussed plan of care with resident.  Labs/tests ordered:  CMP, CBC, and Lipid panel  Goals of care:   Long-term care  Kenard Gower, NP [facility] [phone] (Monday-Friday 8:00 a.m. - 5:00 p.m.) [phone] (after hours)

## 2017-09-19 DIAGNOSIS — E785 Hyperlipidemia, unspecified: Secondary | ICD-10-CM | POA: Diagnosis not present

## 2017-09-19 DIAGNOSIS — Z79899 Other long term (current) drug therapy: Secondary | ICD-10-CM | POA: Diagnosis not present

## 2017-09-19 DIAGNOSIS — D649 Anemia, unspecified: Secondary | ICD-10-CM | POA: Diagnosis not present

## 2017-09-19 LAB — BASIC METABOLIC PANEL
BUN: 22 — AB (ref 4–21)
CREATININE: 0.8 (ref 0.5–1.1)
Glucose: 99
POTASSIUM: 3.8 (ref 3.4–5.3)
Sodium: 140 (ref 137–147)

## 2017-09-19 LAB — LIPID PANEL
Cholesterol: 151 (ref 0–200)
HDL: 41 (ref 35–70)
LDL Cholesterol: 89
LDl/HDL Ratio: 3.7
TRIGLYCERIDES: 106 (ref 40–160)

## 2017-09-19 LAB — CBC AND DIFFERENTIAL
HCT: 42 (ref 36–46)
HEMOGLOBIN: 13.5 (ref 12.0–16.0)
Neutrophils Absolute: 3
PLATELETS: 167 (ref 150–399)
WBC: 7.8

## 2017-09-19 LAB — HEPATIC FUNCTION PANEL
ALT: 8 (ref 7–35)
AST: 14 (ref 13–35)
Alkaline Phosphatase: 119 (ref 25–125)
BILIRUBIN, TOTAL: 0.6

## 2017-09-22 ENCOUNTER — Encounter: Payer: Medicare (Managed Care) | Admitting: Surgery

## 2017-09-22 DIAGNOSIS — I83018 Varicose veins of right lower extremity with ulcer other part of lower leg: Secondary | ICD-10-CM | POA: Diagnosis not present

## 2017-09-22 DIAGNOSIS — I83028 Varicose veins of left lower extremity with ulcer other part of lower leg: Secondary | ICD-10-CM | POA: Diagnosis not present

## 2017-09-29 DIAGNOSIS — I83028 Varicose veins of left lower extremity with ulcer other part of lower leg: Secondary | ICD-10-CM | POA: Diagnosis not present

## 2017-09-29 DIAGNOSIS — I83018 Varicose veins of right lower extremity with ulcer other part of lower leg: Secondary | ICD-10-CM | POA: Diagnosis not present

## 2017-10-06 DIAGNOSIS — I83018 Varicose veins of right lower extremity with ulcer other part of lower leg: Secondary | ICD-10-CM | POA: Diagnosis not present

## 2017-10-06 DIAGNOSIS — I83028 Varicose veins of left lower extremity with ulcer other part of lower leg: Secondary | ICD-10-CM | POA: Diagnosis not present

## 2017-10-08 DIAGNOSIS — F0281 Dementia in other diseases classified elsewhere with behavioral disturbance: Secondary | ICD-10-CM | POA: Diagnosis not present

## 2017-10-08 DIAGNOSIS — R1311 Dysphagia, oral phase: Secondary | ICD-10-CM | POA: Diagnosis not present

## 2017-10-09 DIAGNOSIS — F0281 Dementia in other diseases classified elsewhere with behavioral disturbance: Secondary | ICD-10-CM | POA: Diagnosis not present

## 2017-10-09 DIAGNOSIS — R1311 Dysphagia, oral phase: Secondary | ICD-10-CM | POA: Diagnosis not present

## 2017-10-10 DIAGNOSIS — F0281 Dementia in other diseases classified elsewhere with behavioral disturbance: Secondary | ICD-10-CM | POA: Diagnosis not present

## 2017-10-10 DIAGNOSIS — R1311 Dysphagia, oral phase: Secondary | ICD-10-CM | POA: Diagnosis not present

## 2017-10-13 DIAGNOSIS — F0281 Dementia in other diseases classified elsewhere with behavioral disturbance: Secondary | ICD-10-CM | POA: Diagnosis not present

## 2017-10-13 DIAGNOSIS — R1311 Dysphagia, oral phase: Secondary | ICD-10-CM | POA: Diagnosis not present

## 2017-10-14 DIAGNOSIS — F0281 Dementia in other diseases classified elsewhere with behavioral disturbance: Secondary | ICD-10-CM | POA: Diagnosis not present

## 2017-10-14 DIAGNOSIS — R1311 Dysphagia, oral phase: Secondary | ICD-10-CM | POA: Diagnosis not present

## 2017-10-15 DIAGNOSIS — F0281 Dementia in other diseases classified elsewhere with behavioral disturbance: Secondary | ICD-10-CM | POA: Diagnosis not present

## 2017-10-15 DIAGNOSIS — R1311 Dysphagia, oral phase: Secondary | ICD-10-CM | POA: Diagnosis not present

## 2017-10-16 DIAGNOSIS — F0281 Dementia in other diseases classified elsewhere with behavioral disturbance: Secondary | ICD-10-CM | POA: Diagnosis not present

## 2017-10-16 DIAGNOSIS — R1311 Dysphagia, oral phase: Secondary | ICD-10-CM | POA: Diagnosis not present

## 2017-10-17 DIAGNOSIS — R1311 Dysphagia, oral phase: Secondary | ICD-10-CM | POA: Diagnosis not present

## 2017-10-17 DIAGNOSIS — F0281 Dementia in other diseases classified elsewhere with behavioral disturbance: Secondary | ICD-10-CM | POA: Diagnosis not present

## 2017-10-20 DIAGNOSIS — M6281 Muscle weakness (generalized): Secondary | ICD-10-CM | POA: Diagnosis not present

## 2017-10-20 DIAGNOSIS — F0281 Dementia in other diseases classified elsewhere with behavioral disturbance: Secondary | ICD-10-CM | POA: Diagnosis not present

## 2017-10-20 DIAGNOSIS — I5030 Unspecified diastolic (congestive) heart failure: Secondary | ICD-10-CM | POA: Diagnosis not present

## 2017-10-20 DIAGNOSIS — R1311 Dysphagia, oral phase: Secondary | ICD-10-CM | POA: Diagnosis not present

## 2017-10-21 DIAGNOSIS — M6281 Muscle weakness (generalized): Secondary | ICD-10-CM | POA: Diagnosis not present

## 2017-10-21 DIAGNOSIS — R1311 Dysphagia, oral phase: Secondary | ICD-10-CM | POA: Diagnosis not present

## 2017-10-21 DIAGNOSIS — F0281 Dementia in other diseases classified elsewhere with behavioral disturbance: Secondary | ICD-10-CM | POA: Diagnosis not present

## 2017-10-21 DIAGNOSIS — I5030 Unspecified diastolic (congestive) heart failure: Secondary | ICD-10-CM | POA: Diagnosis not present

## 2017-10-23 ENCOUNTER — Non-Acute Institutional Stay (SKILLED_NURSING_FACILITY): Payer: Medicare Other | Admitting: Internal Medicine

## 2017-10-23 ENCOUNTER — Encounter: Payer: Self-pay | Admitting: Internal Medicine

## 2017-10-23 DIAGNOSIS — I481 Persistent atrial fibrillation: Secondary | ICD-10-CM | POA: Diagnosis not present

## 2017-10-23 DIAGNOSIS — M1A9XX0 Chronic gout, unspecified, without tophus (tophi): Secondary | ICD-10-CM | POA: Diagnosis not present

## 2017-10-23 DIAGNOSIS — I10 Essential (primary) hypertension: Secondary | ICD-10-CM | POA: Diagnosis not present

## 2017-10-23 DIAGNOSIS — F0390 Unspecified dementia without behavioral disturbance: Secondary | ICD-10-CM | POA: Diagnosis not present

## 2017-10-23 DIAGNOSIS — N189 Chronic kidney disease, unspecified: Secondary | ICD-10-CM | POA: Diagnosis not present

## 2017-10-23 DIAGNOSIS — I4819 Other persistent atrial fibrillation: Secondary | ICD-10-CM

## 2017-10-23 NOTE — Assessment & Plan Note (Signed)
Rate adequately controlled 

## 2017-10-23 NOTE — Assessment & Plan Note (Signed)
She describes pain at the site of the healed ulcer over the left lower extremity, this does not suggest gout

## 2017-10-23 NOTE — Progress Notes (Signed)
department  NURSING HOME LOCATION:  [facility] ROOM NUMBER:  [id number]  CODE STATUS:  Full Code  PCP:  Pecola LawlessHopper, Nyko Gell F, MD  [address] [place] [address]  This is a nursing facility follow up of chronic medical diagnoses   Interim medical record and care since last [facility] visit was updated with review of diagnostic studies and change in clinical status since last visit were documented.  HPI: Patient is a permanent resident of facility with medical diagnoses of essential hypertension, chronic atrial fibrillation, chronic diastolic congestive heart failure, COPD,CKD, dyslipidemia, gout, and history of alcohol abuse. Also she has diagnosis of dementia without behavioral disturbance in the context of the alcohol abuse as well as history of stroke. Past history includes seizure disorder, she's had no seizures here @ the facility. Most recent labs were 07/25/17. Renal function and electrolytes were normal. A1c was prediabetic at 6.2%. CBC is not current but she exhibited no abnormalities on 12/24/16. The patient states she has no significant symptoms. She has occasional cough in the context of history of asthma. She also describes occasional dizziness when she sits up. She states that although the ulcer on the left leg is healing it is "raw inside"and still hurts. She indicates that this pain is an indication that "it is going to rain".  Review of systems: Dementia invalidated responses. She was unable to provide the date beyond 2016. When asked the significance of 10/24/1942, she stated "war". She indicated that multiple female family members had served in the war.   Constitutional: No fever, significant weight change, fatigue  Eyes: No redness, discharge, pain, vision change ENT/mouth: No nasal congestion,  purulent discharge, earache, change in hearing, sore throat  Cardiovascular: No chest pain, palpitations, paroxysmal nocturnal dyspnea, claudication, edema  Respiratory: No   sputum production, hemoptysis, DOE, significant snoring, apnea   Gastrointestinal: No heartburn, dysphagia, abdominal pain, nausea /vomiting, rectal bleeding, melena, change in bowels Genitourinary: No dysuria, hematuria, pyuria, incontinence, nocturia Musculoskeletal: No joint stiffness, joint swelling, weaknes Dermatologic: No rash, pruritus, change in appearance of skin Neurologic: No headache, syncope, seizures, numbness, tingling Psychiatric: No significant anxiety, depression, insomnia, anorexia Endocrine: No change in hair/skin/nails, excessive thirst, excessive hunger, excessive urination  Hematologic/lymphatic: No significant bruising, lymphadenopathy, abnormal bleeding Allergy/immunology: No itchy/watery eyes, significant sneezing, urticaria, angioedema  Physical exam:  Pertinent or positive findings: She appears her stated age. There is marked thinning of eyebrows. She has arcus senilis. Significant asymmetry of the nasolabial folds is present . She only has a few lower teeth&  is otherwise edentulous. There is marked hirsutism of the chin. Heart sounds are distant and slightly irregular. She has minor rales. Her breast are markedly pendulous extending over the upper abdomen. Abdomen is protuberant. Pedal pulses are decreased. She has clubbing of fingernails with elongated nails. Toenails are deformed and thickened. She has flexion contractures of the toes. There is atrophy of the calves. There is a large healed ulcer over the left medial calf  General appearance:  no acute distress, increased work of breathing is present.   Lymphatic: No lymphadenopathy about the head, neck, axilla. Eyes: No conjunctival inflammation or lid edema is present. There is no scleral icterus. Ears:  External ear exam shows no significant lesions or deformities.   Nose:  External nasal examination shows no deformity or inflammation. Nasal mucosa are pink and moist without lesions, exudates Oral exam:  Lips  and gums are healthy appearing.  Neck:  No thyromegaly, masses, tenderness noted.  Heart: without gallop, murmur, click, rub . Lungs: without wheezes, rhonchi,  rubs. Abdomen: Bowel sounds are normal. Abdomen is soft and nontender with no organomegaly, hernias, masses. GU: Deferred  Extremities:  No cyanosis, edema  Neurologic exam : Balance, Rhomberg, finger to nose testing could not be completed due to clinical state Skin: Warm & dry w/o tenting. No significant rash.  See summary under each active problem in the Problem List with associated updated therapeutic plan

## 2017-10-23 NOTE — Assessment & Plan Note (Signed)
BP controlled; no change in antihypertensive medications  

## 2017-10-23 NOTE — Assessment & Plan Note (Signed)
Current labs revealed normal renal function

## 2017-10-23 NOTE — Patient Instructions (Signed)
See assessment and plan under each diagnosis in the problem list and acutely for this visit 

## 2017-10-23 NOTE — Assessment & Plan Note (Signed)
Etiology of dementia is unclear, but contributing factors include history of stroke and history of alcohol abuse

## 2017-10-26 NOTE — Progress Notes (Deleted)
Cardiology Office Note   Date:  10/26/2017   ID:  Regina Sandoval, DOB 1940/11/03, MRN [id number]  PCP:  Pecola Lawless, MD    No chief complaint on file.  AFib  Wt Readings from Last 3 Encounters:  10/23/17 172 lb 6.4 oz (78.2 kg)  09/18/17 164 lb 3.2 oz (74.5 kg)  08/28/17 171 lb (77.6 kg)       History of Present Illness: Regina Sandoval is a 77 y.o. female  With ahistory of persistent atrial fib (managed with rate control), alcoholism in remission, chronic diastolic CHF, dementia, CVA, CKD III, HTN, HLD (followed by PCP), seizure disorder,&subclinical hyperthyroidism.  Previous recod review showed: In December 2017 she was diagnosed with new onset atrial fib. She's been treated with Xarelto and rate control.Her med changes in 2018noted to besomewhat tricky to follow.    At OV 05/2016 it was suggested she add diltiazem to her regimen; she was also on both carvedilol and metoprolol at that visit. Toprol 200mg  was continued but didnot see rx for diltiazem sent in at that time. When seen by Jacolyn Reedy PA-C in follow-up 06/2016, EKG in 06/2016 showed sinus bradycardia with HR logged as 51 at that visit (diltiazem was not on her medicine list). When seen by Dr. Eldridge Dace in 09/2016, HR logged as 68bpm but exam documented as irregular and tachycardic with again recommendation to add diltiazem 120mg  daily. She saw Robbie Lis PA-C on 12/06/16 for regular follow-up at which time she had continued 2+ LEE and RVR 133bpm. She had not taken her meds yet that day. Lasix 20mg  daily was added. Per Brittainy's note, it had previously been decided she is not a candidate for amiodarone or cardioversion.  Swelling in 8/18 was noted to be improved      Past Medical History:  Diagnosis Date  . Alcohol dependence in remission (HCC)   . Arthritis   . Cataract   . Chronic anticoagulation    Xarelto  . Chronic diastolic heart failure (HCC)   . CKD (chronic kidney disease), stage III  (HCC)   . CVA (cerebral infarction)   . Dementia   . Edema   . Gout   . Hyperlipidemia   . Hypertension   . Murmur   . Osteoarthrosis, generalized, involving multiple sites    INVOLVING RIGHT HIP ,KNEE. AND ANKLE  . Persistent atrial fibrillation (HCC)    a. Dx 04/2016, on anticoag, not felt to be a candidate for amiodarone or cardioversion.  . Seizure disorder (HCC)   . Subclinical hyperthyroidism   . Vitamin D deficiency     No past surgical history on file.   Current Outpatient Medications  Medication Sig Dispense Refill  . acetaminophen (TYLENOL) 500 MG tablet Take 1,000 mg by mouth 2 (two) times daily as needed (Arthritic pain).     Marland Kitchen allopurinol (ZYLOPRIM) 300 MG tablet Take 300 mg by mouth daily.    Marland Kitchen atorvastatin (LIPITOR) 40 MG tablet Take 40 mg by mouth daily.    . Cholecalciferol (VITAMIN D3) 50000 units CAPS Take 1 capsule by mouth every 30 (thirty) days.    . colchicine 0.6 MG tablet Take 0.6 mg by mouth daily.    Marland Kitchen diltiazem (CARDIZEM CD) 180 MG 24 hr capsule Take 1 capsule (180 mg total) by mouth daily. 30 capsule 2  . donepezil (ARICEPT) 10 MG tablet Take 10 mg by mouth at bedtime.    . fluticasone (FLONASE) 50 MCG/ACT nasal spray Place 1 spray into both  nostrils daily as needed for allergies.     . furosemide (LASIX) 80 MG tablet Take 80 mg by mouth daily.     Marland Kitchen. ipratropium-albuterol (DUONEB) 0.5-2.5 (3) MG/3ML SOLN Take 3 mLs by nebulization 4 (four) times daily as needed (sob or wheezing).     . metoprolol (TOPROL-XL) 200 MG 24 hr tablet Take 200 mg by mouth daily.     Marland Kitchen. NUTRITIONAL SUPPLEMENT LIQD Take 120 mLs by mouth daily. MedPass    . potassium chloride SA (K-DUR,KLOR-CON) 20 MEQ tablet Take 40 mEq by mouth daily.     . rivaroxaban (XARELTO) 20 MG TABS tablet Take 20 mg by mouth daily with supper.      No current facility-administered medications for this visit.     Allergies:   Penicillins    Social History:  The patient  reports that she has quit  smoking. Her smoking use included cigarettes. She has a 50.00 pack-year smoking history. She has never used smokeless tobacco. She reports that she does not drink alcohol or use drugs.   Family History:  The patient's ***family history includes Asthma in her grandchild; Diabetes in her daughter; Hypertension in her father.    ROS:  Please see the history of present illness.   Otherwise, review of systems are positive for ***.   All other systems are reviewed and negative.    PHYSICAL EXAM: VS:  There were no vitals taken for this visit. , BMI There is no height or weight on file to calculate BMI. GEN: Well nourished, well developed, in no acute distress HEENT: normal Neck: no JVD, carotid bruits, or masses Cardiac: ***RRR; no murmurs, rubs, or gallops,no edema  Respiratory:  clear to auscultation bilaterally, normal work of breathing GI: soft, nontender, nondistended, + BS MS: no deformity or atrophy Skin: warm and dry, no rash Neuro:  Strength and sensation are intact Psych: euthymic mood, full affect   EKG:   The ekg ordered today demonstrates ***   Recent Labs: 12/24/2016: ALT 9; Hemoglobin 13.4; Magnesium 1.6; Platelets 167; TSH 1.110 12/31/2016: NT-Pro BNP 1,571 07/25/2017: BUN 20; Creatinine 1.1; Potassium 4.1; Sodium 144   Lipid Panel    Component Value Date/Time   CHOL 264 (H) 05/15/2010 2108   TRIG 83 05/15/2010 2108   HDL 68 05/15/2010 2108   CHOLHDL 3.9 Ratio 05/15/2010 2108   VLDL 17 05/15/2010 2108   LDLCALC 179 (H) 05/15/2010 2108     Other studies Reviewed: Additional studies/ records that were reviewed today with results demonstrating: ***.   ASSESSMENT AND PLAN:  1. AFib: 2. Chronic diastolic heart failure: 3. CKD stage III: 4. HTN: 5. Bradycardia:   Current medicines are reviewed at length with the patient today.  The patient concerns regarding her medicines were addressed.  The following changes have been made:  No change***  Labs/ tests  ordered today include: *** No orders of the defined types were placed in this encounter.   Recommend 150 minutes/week of aerobic exercise Low fat, low carb, high fiber diet recommended  Disposition:   FU in ***   Signed, Lance MussJayadeep Renesmee Raine, MD  10/26/2017 11:07 PM    [facility] [address], [place], [place]  [address] Phone: [phone]; Fax: [phone]

## 2017-10-28 ENCOUNTER — Ambulatory Visit: Payer: Medicaid Other | Admitting: Interventional Cardiology

## 2017-11-03 ENCOUNTER — Encounter: Payer: Self-pay | Admitting: Surgery

## 2017-11-03 ENCOUNTER — Other Ambulatory Visit: Payer: Self-pay

## 2017-11-03 ENCOUNTER — Ambulatory Visit (INDEPENDENT_AMBULATORY_CARE_PROVIDER_SITE_OTHER): Payer: Medicare Other | Admitting: Surgery

## 2017-11-03 VITALS — BP 123/80 | HR 50 | Temp 98.0°F | Resp 16 | Ht 68.0 in | Wt 172.0 lb

## 2017-11-03 DIAGNOSIS — I70212 Atherosclerosis of native arteries of extremities with intermittent claudication, left leg: Secondary | ICD-10-CM

## 2017-11-03 NOTE — Progress Notes (Signed)
Vascular and Vein Specialist of [facility]  Patient name: Regina Sandoval MRN: [id number] DOB: 01-Aug-1940 Sex: female   REQUESTING PROVIDER:    Dr. Alwyn Ren    REASON FOR CONSULT:    PAD  HISTORY OF PRESENT ILLNESS:   Regina Sandoval is a 77 y.o. female, who is referred today for evaluation of her blood flow.  She comes with outside ABIs and segmental studies.  She states that she has an area on her left leg that has been bothering her with swelling and an open wound but that it looks much much better.  She complains of pain from her calf down on the left leg.  She does not ambulate.  Patient lives in [facility].  She suffers from dementia.  She has a history of stroke.  She has stage III chronic renal insufficiency she is on a statin for hypercholesterolemia.  She is medically managed for hypertension.  She does take Xarelto for anticoagulation.    PAST MEDICAL HISTORY    Past Medical History:  Diagnosis Date  . Alcohol dependence in remission (HCC)   . Arthritis   . Cataract   . Chronic anticoagulation    Xarelto  . Chronic diastolic heart failure (HCC)   . CKD (chronic kidney disease), stage III (HCC)   . CVA (cerebral infarction)   . Dementia   . Edema   . Gout   . Hyperlipidemia   . Hypertension   . Murmur   . Osteoarthrosis, generalized, involving multiple sites    INVOLVING RIGHT HIP ,KNEE. AND ANKLE  . Persistent atrial fibrillation (HCC)    a. Dx 04/2016, on anticoag, not felt to be a candidate for amiodarone or cardioversion.  . Seizure disorder (HCC)   . Subclinical hyperthyroidism   . Vitamin D deficiency      FAMILY HISTORY   Family History  Problem Relation Age of Onset  . Asthma Grandchild   . Hypertension Father   . Diabetes Daughter     SOCIAL HISTORY:   Social History   Socioeconomic History  . Marital status: Divorced    Spouse name: Not on file  . Number of children: Not on file  . Years of  education: Not on file  . Highest education level: Not on file  Occupational History  . Not on file  Social Needs  . Financial resource strain: Not hard at all  . Food insecurity:    Worry: Never true    Inability: Never true  . Transportation needs:    Medical: No    Non-medical: No  Tobacco Use  . Smoking status: Former Smoker    Packs/day: 1.00    Years: 50.00    Pack years: 50.00    Types: Cigarettes  . Smokeless tobacco: Never Used  Substance and Sexual Activity  . Alcohol use: No    Alcohol/week: 0.0 oz  . Drug use: No  . Sexual activity: Not on file  Lifestyle  . Physical activity:    Days per week: 0 days    Minutes per session: 0 min  . Stress: Only a little  Relationships  . Social connections:    Talks on phone: Once a week    Gets together: Once a week    Attends religious service: Never    Active member of club or organization: No    Attends meetings of clubs or organizations: Never    Relationship status: Divorced  . Intimate partner violence:    Fear of  current or ex partner: No    Emotionally abused: No    Physically abused: No    Forced sexual activity: No  Other Topics Concern  . Not on file  Social History Narrative   Lived with daughter at home prior to SNF placement    ALLERGIES:    Allergies  Allergen Reactions  . Penicillins Hives    CURRENT MEDICATIONS:    Current Outpatient Medications  Medication Sig Dispense Refill  . acetaminophen (TYLENOL) 500 MG tablet Take 1,000 mg by mouth 2 (two) times daily as needed (Arthritic pain).     Marland Kitchen. allopurinol (ZYLOPRIM) 300 MG tablet Take 300 mg by mouth daily.    Marland Kitchen. atorvastatin (LIPITOR) 40 MG tablet Take 40 mg by mouth daily.    . bisacodyl (DULCOLAX) 10 MG suppository Place 10 mg rectally as needed for moderate constipation.    . Cholecalciferol (VITAMIN D3) 50000 units CAPS Take 1 capsule by mouth every 30 (thirty) days.    . colchicine 0.6 MG tablet Take 0.6 mg by mouth daily.    Marland Kitchen.  diltiazem (CARDIZEM CD) 180 MG 24 hr capsule Take 1 capsule (180 mg total) by mouth daily. 30 capsule 2  . donepezil (ARICEPT) 10 MG tablet Take 10 mg by mouth at bedtime.    . fluticasone (FLONASE) 50 MCG/ACT nasal spray Place 1 spray into both nostrils daily as needed for allergies.     . furosemide (LASIX) 80 MG tablet Take 80 mg by mouth daily.     Marland Kitchen. ipratropium-albuterol (DUONEB) 0.5-2.5 (3) MG/3ML SOLN Take 3 mLs by nebulization 4 (four) times daily as needed (sob or wheezing).     . magnesium hydroxide (MILK OF MAGNESIA) 400 MG/5ML suspension Take by mouth daily as needed for mild constipation.    . metoprolol (TOPROL-XL) 200 MG 24 hr tablet Take 200 mg by mouth daily.     Marland Kitchen. NUTRITIONAL SUPPLEMENT LIQD Take 120 mLs by mouth daily. MedPass    . potassium chloride SA (K-DUR,KLOR-CON) 20 MEQ tablet Take 40 mEq by mouth daily.     . rivaroxaban (XARELTO) 20 MG TABS tablet Take 20 mg by mouth daily with supper.      No current facility-administered medications for this visit.     REVIEW OF SYSTEMS:   [X]  denotes positive finding, [ ]  denotes negative finding Cardiac  Comments:  Chest pain or chest pressure:    Shortness of breath upon exertion:    Short of breath when lying flat:    Irregular heart rhythm:        Vascular    Pain in calf, thigh, or hip brought on by ambulation:    Pain in feet at night that wakes you up from your sleep:     Blood clot in your veins:    Leg swelling:         Pulmonary    Oxygen at home:    Productive cough:     Wheezing:         Neurologic    Sudden weakness in arms or legs:     Sudden numbness in arms or legs:     Sudden onset of difficulty speaking or slurred speech:    Temporary loss of vision in one eye:     Problems with dizziness:         Gastrointestinal    Blood in stool:      Vomited blood:         Genitourinary  Burning when urinating:     Blood in urine:        Psychiatric    Major depression:         Hematologic      Bleeding problems:    Problems with blood clotting too easily:        Skin    Rashes or ulcers:        Constitutional    Fever or chills:     PHYSICAL EXAM:   Vitals:   11/03/17 1413  BP: 123/80  Pulse: (!) 50  Resp: 16  Temp: 98 F (36.7 C)  TempSrc: Oral  SpO2: 96%  Weight: 172 lb (78 kg)  Height: 5\' 8"  (1.727 m)    GENERAL: The patient is a well-nourished female, in no acute distress. The vital signs are documented above. CARDIAC: There is a regular rate and rhythm.  VASCULAR: Pedal pulses are not palpable.  Had difficulty feeling femoral pulses as she could not get out of her wheelchair PULMONARY: Nonlabored respirations MUSCULOSKELETAL: There are no major deformities or cyanosis. NEUROLOGIC: No focal weakness or paresthesias are detected. SKIN: Nearly healed wound to the left medial ankle PSYCHIATRIC: The patient has a normal affect.  STUDIES:   I have reviewed her outside vascular lab studies.  ABI on the right is 0.68.  The left is 0.73.  She appears to have multilevel disease, and flow outflow and runoff  ASSESSMENT and PLAN   PAD: I discussed with the patient that she has bilateral lower extremity arterial insufficiency.  This is likely multilevel disease.  I told her that I think revascularization would help with healing the remaining portion of her wound on the left.  I am not sure if it would improve the pain she is having in her foot as I feel that this is likely from another etiology.  She wants to think about this and discuss it with her other doctors.  She is going to contact me if she wishes to proceed.  If so she would need angiography via a right femoral approach and intervention on the left leg   Durene Cal, MD Vascular and Vein Specialists of [facility] [phone] Pager [phone]

## 2017-11-14 DIAGNOSIS — R1311 Dysphagia, oral phase: Secondary | ICD-10-CM | POA: Diagnosis not present

## 2017-11-14 DIAGNOSIS — I5030 Unspecified diastolic (congestive) heart failure: Secondary | ICD-10-CM | POA: Diagnosis not present

## 2017-11-14 DIAGNOSIS — M6281 Muscle weakness (generalized): Secondary | ICD-10-CM | POA: Diagnosis not present

## 2017-11-14 DIAGNOSIS — F0281 Dementia in other diseases classified elsewhere with behavioral disturbance: Secondary | ICD-10-CM | POA: Diagnosis not present

## 2017-11-17 DIAGNOSIS — M1711 Unilateral primary osteoarthritis, right knee: Secondary | ICD-10-CM | POA: Diagnosis not present

## 2017-11-17 DIAGNOSIS — M6281 Muscle weakness (generalized): Secondary | ICD-10-CM | POA: Diagnosis not present

## 2017-11-17 DIAGNOSIS — I5030 Unspecified diastolic (congestive) heart failure: Secondary | ICD-10-CM | POA: Diagnosis not present

## 2017-11-18 DIAGNOSIS — I5030 Unspecified diastolic (congestive) heart failure: Secondary | ICD-10-CM | POA: Diagnosis not present

## 2017-11-18 DIAGNOSIS — M1711 Unilateral primary osteoarthritis, right knee: Secondary | ICD-10-CM | POA: Diagnosis not present

## 2017-11-18 DIAGNOSIS — M6281 Muscle weakness (generalized): Secondary | ICD-10-CM | POA: Diagnosis not present

## 2017-11-19 ENCOUNTER — Non-Acute Institutional Stay (SKILLED_NURSING_FACILITY): Payer: Medicare Other | Admitting: Adult Health

## 2017-11-19 ENCOUNTER — Encounter: Payer: Self-pay | Admitting: Adult Health

## 2017-11-19 DIAGNOSIS — I10 Essential (primary) hypertension: Secondary | ICD-10-CM

## 2017-11-19 DIAGNOSIS — I4819 Other persistent atrial fibrillation: Secondary | ICD-10-CM

## 2017-11-19 DIAGNOSIS — M1A9XX0 Chronic gout, unspecified, without tophus (tophi): Secondary | ICD-10-CM | POA: Diagnosis not present

## 2017-11-19 DIAGNOSIS — I481 Persistent atrial fibrillation: Secondary | ICD-10-CM

## 2017-11-19 DIAGNOSIS — I5032 Chronic diastolic (congestive) heart failure: Secondary | ICD-10-CM | POA: Diagnosis not present

## 2017-11-19 NOTE — Progress Notes (Signed)
Location:  [facility] Room Number: 103-A Place of Service:  SNF (31) Provider:  Kenard Gower, NP  Patient Care Team: Pecola Lawless, MD as PCP - General (Internal Medicine) Medina-Vargas, Margit Banda, NP as Nurse Practitioner (Internal Medicine)  Extended Emergency Contact Information Primary Emergency Contact: Duguay-Thomas,Sherill Address: [address] [place], [place] [address] [place] Home Phone: [phone] Relation: Daughter  Code Status:  Full Code  Goals of care: Advanced Directive information Advanced Directives 11/03/2017  Does Patient Have a Medical Advance Directive? No  Would patient like information on creating a medical advance directive? -     Chief Complaint  Patient presents with  . Medical Management of Chronic Issues    The patient is seen for a routine [facility] visit    HPI:  Pt is a 77 y.o. female seen today for medical management of chronic diseases.  She is a long-term care resident of [facility].  She has a PMH of persistent atrial fibrillation, essential hypertension, chronic diastolic CHF, COPD, stage III renal insufficiency, dyslipidemia, dementia, seizure disorder, gout, history of CVA, and history of alcohol abuse. She was seen in the room today. Left lower leg wound has healed.    Past Medical History:  Diagnosis Date  . Alcohol dependence in remission (HCC)   . Arthritis   . Cataract   . Chronic anticoagulation    Xarelto  . Chronic diastolic heart failure (HCC)   . CKD (chronic kidney disease), stage III (HCC)   . CVA (cerebral infarction)   . Dementia   . Edema   . Gout   . Hyperlipidemia   . Hypertension   . Murmur   . Osteoarthrosis, generalized, involving multiple sites    INVOLVING RIGHT HIP ,KNEE. AND ANKLE  . Persistent atrial fibrillation (HCC)    a. Dx 04/2016, on anticoag, not felt to be a candidate for amiodarone or cardioversion.   . Seizure disorder (HCC)   . Subclinical hyperthyroidism   . Vitamin D deficiency    History reviewed. No pertinent surgical history.  Allergies  Allergen Reactions  . Penicillins Hives    Outpatient Encounter Medications as of 11/19/2017  Medication Sig  . acetaminophen (TYLENOL) 500 MG tablet Take 1,000 mg by mouth 2 (two) times daily as needed (Arthritic pain).   Marland Kitchen allopurinol (ZYLOPRIM) 300 MG tablet Take 300 mg by mouth daily.  Marland Kitchen atorvastatin (LIPITOR) 40 MG tablet Take 40 mg by mouth daily.   . bisacodyl (DULCOLAX) 10 MG suppository Place 10 mg rectally as needed for moderate constipation.  . Cholecalciferol (VITAMIN D3) 50000 units CAPS Take 1 capsule by mouth every 30 (thirty) days.  . colchicine 0.6 MG tablet Take 0.6 mg by mouth daily.  Marland Kitchen diltiazem (CARDIZEM CD) 180 MG 24 hr capsule Take 1 capsule (180 mg total) by mouth daily.  Marland Kitchen donepezil (ARICEPT) 10 MG tablet Take 10 mg by mouth at bedtime.   . fluticasone (FLONASE) 50 MCG/ACT nasal spray Place 1 spray into both nostrils daily as needed for allergies.   . furosemide (LASIX) 80 MG tablet Take 80 mg by mouth daily.   Marland Kitchen ipratropium-albuterol (DUONEB) 0.5-2.5 (3) MG/3ML SOLN Take 3 mLs by nebulization 4 (four) times daily as needed (sob or wheezing).   . magnesium hydroxide (MILK OF MAGNESIA) 400 MG/5ML suspension Take by mouth daily as needed for mild constipation.  . metoprolol (TOPROL-XL) 200 MG 24  hr tablet Take 200 mg by mouth daily.   Marland Kitchen NUTRITIONAL SUPPLEMENT LIQD Take 120 mLs by mouth daily. MedPass   . potassium chloride SA (K-DUR,KLOR-CON) 20 MEQ tablet Take 40 mEq by mouth daily.   . rivaroxaban (XARELTO) 20 MG TABS tablet Take 20 mg by mouth daily with supper.    No facility-administered encounter medications on file as of 11/19/2017.     Review of Systems  GENERAL: No change in appetite, no fatigue, no weight changes, no fever, chills or weakness MOUTH and THROAT: Denies oral discomfort, gingival pain or  bleeding, pain from teeth or hoarseness   RESPIRATORY: no cough, SOB, DOE, wheezing, hemoptysis CARDIAC: No chest pain, edema or palpitations GI: No abdominal pain, diarrhea, constipation, heart burn, nausea or vomiting GU: Denies dysuria, frequency, hematuria, incontinence, or discharge PSYCHIATRIC: Denies feelings of depression or anxiety. No report of hallucinations, insomnia, paranoia, or agitation   Immunization History  Administered Date(s) Administered  . Influenza Whole 05/15/2010  . Influenza-Unspecified 02/17/2017  . Pneumococcal Polysaccharide-23 10/25/2015  . Pneumococcal-Unspecified 05/20/2014   Pertinent  Health Maintenance Due  Topic Date Due  . DEXA SCAN  10/11/2003  . PNA vac Low Risk Adult (2 of 2 - PCV13) 10/24/2016  . INFLUENZA VACCINE  12/18/2017   Fall Risk  08/28/2017  Falls in the past year? No      Vitals:   11/19/17 0855  BP: (!) 142/75  Pulse: 77  Resp: 17  Temp: (!) 97 F (36.1 C)  TempSrc: Oral  SpO2: 91%  Weight: 172 lb 6.4 oz (78.2 kg)  Height: 5\' 8"  (1.727 m)   Body mass index is 26.21 kg/m.  Physical Exam  GENERAL APPEARANCE: Well nourished. In no acute distress. Normal body habitus SKIN:  Skin is warm and dry.  MOUTH and THROAT: Lips are without lesions. Oral mucosa is moist and without lesions. Tongue is normal in shape, size, and color and without lesions RESPIRATORY: Breathing is even & unlabored, BS CTAB CARDIAC: Irregularly irregular, no murmur,no extra heart sounds, no edema GI: Abdomen soft, normal BS, no masses, no tenderness EXTREMITIES:  Able to move X 4 extremities NEUROLOGICAL: There is no tremor. Speech is clear PSYCHIATRIC: Alert to self, disoriented to time and place. Affect and behavior are appropriate   Labs reviewed: Recent Labs    12/24/16 0849 12/31/16 1233 07/25/17  NA 143 145* 144  K 4.0 4.1 4.1  CL 107* 106  --   CO2 27 24  --   GLUCOSE 95 100*  --   BUN 21 20 20   CREATININE 1.06* 1.03* 1.1    CALCIUM 9.7 9.4  --   MG 1.6  --   --    Recent Labs    12/24/16 0849  AST 16  ALT 9  ALKPHOS 136*  BILITOT 0.6  PROT 7.0  ALBUMIN 3.8   Recent Labs    12/24/16 0849  WBC 5.6  NEUTROABS 2.8  HGB 13.4  HCT 39.2  MCV 83  PLT 167   Lab Results  Component Value Date   TSH 1.110 12/24/2016   Lab Results  Component Value Date   HGBA1C 6.2 07/25/2017   Lab Results  Component Value Date   CHOL 264 (H) 05/15/2010   HDL 68 05/15/2010   LDLCALC 179 (H) 05/15/2010   TRIG 83 05/15/2010   CHOLHDL 3.9 Ratio 05/15/2010    Assessment/Plan  1. Essential hypertension - well-controlled, continue Metoprolol succinate ER 200 mg 1 tab daily and Diltiazem  24HR ER 180 mg daily   2. Chronic diastolic heart failure (HCC) - no SOB, continue Lasix 80 mg daily   3. Chronic gout without tophus, unspecified cause, unspecified site - stable, continue Allopurinol 300 mg 1 tab daily and Colchicine 0.6 mg 1 tab daily   4. Persistent atrial fibrillation (HCC) - rate-controlled, continue Metoprolol succinate ER 200 mg 1 tab daily, Diltiazem 24HR ER 180 daily, and Xarelto 20 mg 1 tab daily       Family/ staff Communication: Discussed plan of care with resident.  Labs/tests ordered:  None   Goals of care:   Long-term care.   Kenard GowerMonina Medina-Vargas, NP [facility] [phone] (Monday-Friday 8:00 a.m. - 5:00 p.m.) [phone] (after hours)

## 2017-11-20 DIAGNOSIS — Z23 Encounter for immunization: Secondary | ICD-10-CM | POA: Diagnosis not present

## 2017-11-21 DIAGNOSIS — M1711 Unilateral primary osteoarthritis, right knee: Secondary | ICD-10-CM | POA: Diagnosis not present

## 2017-11-21 DIAGNOSIS — M6281 Muscle weakness (generalized): Secondary | ICD-10-CM | POA: Diagnosis not present

## 2017-11-21 DIAGNOSIS — I5030 Unspecified diastolic (congestive) heart failure: Secondary | ICD-10-CM | POA: Diagnosis not present

## 2017-11-24 DIAGNOSIS — M6281 Muscle weakness (generalized): Secondary | ICD-10-CM | POA: Diagnosis not present

## 2017-11-24 DIAGNOSIS — I5030 Unspecified diastolic (congestive) heart failure: Secondary | ICD-10-CM | POA: Diagnosis not present

## 2017-11-24 DIAGNOSIS — M1711 Unilateral primary osteoarthritis, right knee: Secondary | ICD-10-CM | POA: Diagnosis not present

## 2017-11-26 DIAGNOSIS — M1711 Unilateral primary osteoarthritis, right knee: Secondary | ICD-10-CM | POA: Diagnosis not present

## 2017-11-26 DIAGNOSIS — M6281 Muscle weakness (generalized): Secondary | ICD-10-CM | POA: Diagnosis not present

## 2017-11-26 DIAGNOSIS — I5030 Unspecified diastolic (congestive) heart failure: Secondary | ICD-10-CM | POA: Diagnosis not present

## 2017-12-10 DIAGNOSIS — M1711 Unilateral primary osteoarthritis, right knee: Secondary | ICD-10-CM | POA: Diagnosis not present

## 2017-12-10 DIAGNOSIS — I5030 Unspecified diastolic (congestive) heart failure: Secondary | ICD-10-CM | POA: Diagnosis not present

## 2017-12-10 DIAGNOSIS — M6281 Muscle weakness (generalized): Secondary | ICD-10-CM | POA: Diagnosis not present

## 2017-12-11 DIAGNOSIS — M6281 Muscle weakness (generalized): Secondary | ICD-10-CM | POA: Diagnosis not present

## 2017-12-11 DIAGNOSIS — I5030 Unspecified diastolic (congestive) heart failure: Secondary | ICD-10-CM | POA: Diagnosis not present

## 2017-12-11 DIAGNOSIS — M1711 Unilateral primary osteoarthritis, right knee: Secondary | ICD-10-CM | POA: Diagnosis not present

## 2017-12-12 DIAGNOSIS — I5030 Unspecified diastolic (congestive) heart failure: Secondary | ICD-10-CM | POA: Diagnosis not present

## 2017-12-12 DIAGNOSIS — M1711 Unilateral primary osteoarthritis, right knee: Secondary | ICD-10-CM | POA: Diagnosis not present

## 2017-12-12 DIAGNOSIS — M6281 Muscle weakness (generalized): Secondary | ICD-10-CM | POA: Diagnosis not present

## 2017-12-15 DIAGNOSIS — M1711 Unilateral primary osteoarthritis, right knee: Secondary | ICD-10-CM | POA: Diagnosis not present

## 2017-12-15 DIAGNOSIS — M6281 Muscle weakness (generalized): Secondary | ICD-10-CM | POA: Diagnosis not present

## 2017-12-15 DIAGNOSIS — I5030 Unspecified diastolic (congestive) heart failure: Secondary | ICD-10-CM | POA: Diagnosis not present

## 2017-12-16 DIAGNOSIS — I5030 Unspecified diastolic (congestive) heart failure: Secondary | ICD-10-CM | POA: Diagnosis not present

## 2017-12-16 DIAGNOSIS — M1711 Unilateral primary osteoarthritis, right knee: Secondary | ICD-10-CM | POA: Diagnosis not present

## 2017-12-16 DIAGNOSIS — M6281 Muscle weakness (generalized): Secondary | ICD-10-CM | POA: Diagnosis not present

## 2017-12-17 DIAGNOSIS — I5030 Unspecified diastolic (congestive) heart failure: Secondary | ICD-10-CM | POA: Diagnosis not present

## 2017-12-17 DIAGNOSIS — M6281 Muscle weakness (generalized): Secondary | ICD-10-CM | POA: Diagnosis not present

## 2017-12-17 DIAGNOSIS — M1711 Unilateral primary osteoarthritis, right knee: Secondary | ICD-10-CM | POA: Diagnosis not present

## 2017-12-18 DIAGNOSIS — M6281 Muscle weakness (generalized): Secondary | ICD-10-CM | POA: Diagnosis not present

## 2017-12-18 DIAGNOSIS — M1711 Unilateral primary osteoarthritis, right knee: Secondary | ICD-10-CM | POA: Diagnosis not present

## 2017-12-19 ENCOUNTER — Non-Acute Institutional Stay (SKILLED_NURSING_FACILITY): Payer: Medicare Other | Admitting: Adult Health

## 2017-12-19 ENCOUNTER — Encounter: Payer: Self-pay | Admitting: Adult Health

## 2017-12-19 DIAGNOSIS — M1711 Unilateral primary osteoarthritis, right knee: Secondary | ICD-10-CM | POA: Diagnosis not present

## 2017-12-19 DIAGNOSIS — M1A9XX0 Chronic gout, unspecified, without tophus (tophi): Secondary | ICD-10-CM

## 2017-12-19 DIAGNOSIS — E7800 Pure hypercholesterolemia, unspecified: Secondary | ICD-10-CM

## 2017-12-19 DIAGNOSIS — I481 Persistent atrial fibrillation: Secondary | ICD-10-CM | POA: Diagnosis not present

## 2017-12-19 DIAGNOSIS — I4819 Other persistent atrial fibrillation: Secondary | ICD-10-CM

## 2017-12-19 DIAGNOSIS — I10 Essential (primary) hypertension: Secondary | ICD-10-CM

## 2017-12-19 DIAGNOSIS — B370 Candidal stomatitis: Secondary | ICD-10-CM | POA: Diagnosis not present

## 2017-12-19 DIAGNOSIS — M6281 Muscle weakness (generalized): Secondary | ICD-10-CM | POA: Diagnosis not present

## 2017-12-19 NOTE — Progress Notes (Signed)
Location:  [facility] Room Number: 103 A Place of Service:  SNF (31) Provider:  Kenard Gower, NP  Patient Care Team: Pecola Lawless, MD as PCP - General (Internal Medicine) Medina-Vargas, Margit Banda, NP as Nurse Practitioner (Internal Medicine)  Extended Emergency Contact Information Primary Emergency Contact: Engebretsen-Thomas,Sherill Address: [address] [place], [place] [address] [place] Home Phone: [phone] Relation: Daughter  Code Status:  Full Code  Goals of care: Advanced Directive information Advanced Directives 12/19/2017  Does Patient Have a Medical Advance Directive? No  Would patient like information on creating a medical advance directive? -     Chief Complaint  Patient presents with  . Medical Management of Chronic Issues    Resident is being seen for a routine visit.     HPI:  Pt is a 77 y.o. female seen today for medical management of chronic diseases. She has PMH of persistent atrial fibrillation, essential hypertension, chronic diastolic chf, copd, stage III renal insufficiency, dyslipidemia, dementia, seizure disorder, gout, history of CVA, and history of alcohol abuse. She was seen in the room today with OT at bedside. Noted to have whitish coating on her tongue which was not removed after brushing tongue.   Past Medical History:  Diagnosis Date  . Alcohol dependence in remission (HCC)   . Arthritis   . Cataract   . Chronic anticoagulation    Xarelto  . Chronic diastolic heart failure (HCC)   . CKD (chronic kidney disease), stage III (HCC)   . CVA (cerebral infarction)   . Dementia   . Edema   . Gout   . Hyperlipidemia   . Hypertension   . Murmur   . Osteoarthrosis, generalized, involving multiple sites    INVOLVING RIGHT HIP ,KNEE. AND ANKLE  . Persistent atrial fibrillation (HCC)    a. Dx 04/2016, on anticoag, not felt to be a candidate for amiodarone or cardioversion.  . Seizure  disorder (HCC)   . Subclinical hyperthyroidism   . Vitamin D deficiency    History reviewed. No pertinent surgical history.  Allergies  Allergen Reactions  . Penicillins Hives    Outpatient Encounter Medications as of 12/19/2017  Medication Sig  . acetaminophen (TYLENOL) 500 MG tablet Take 1,000 mg by mouth 2 (two) times daily as needed (Arthritic pain).   Marland Kitchen allopurinol (ZYLOPRIM) 300 MG tablet Take 300 mg by mouth daily.  Marland Kitchen atorvastatin (LIPITOR) 40 MG tablet Take 40 mg by mouth daily.   . bisacodyl (DULCOLAX) 10 MG suppository Place 10 mg rectally as needed for moderate constipation.  . Cholecalciferol (VITAMIN D3) 50000 units CAPS Take 1 capsule by mouth every 30 (thirty) days.  Marland Kitchen diltiazem (CARDIZEM) 90 MG tablet Take 90 mg by mouth 2 (two) times daily.  Marland Kitchen donepezil (ARICEPT) 10 MG tablet Take 10 mg by mouth at bedtime.   . fluticasone (FLONASE) 50 MCG/ACT nasal spray Place 1 spray into both nostrils daily as needed for allergies.   . furosemide (LASIX) 80 MG tablet Take 80 mg by mouth daily.   Marland Kitchen ipratropium-albuterol (DUONEB) 0.5-2.5 (3) MG/3ML SOLN Take 3 mLs by nebulization 4 (four) times daily as needed (sob or wheezing).   . magnesium hydroxide (MILK OF MAGNESIA) 400 MG/5ML suspension Take by mouth daily as needed for mild constipation.  . metoprolol (TOPROL-XL) 200 MG 24 hr tablet Take 200 mg by mouth daily.   Marland Kitchen NUTRITIONAL SUPPLEMENT LIQD Take 120 mLs  by mouth daily. MedPass   . potassium chloride SA (K-DUR,KLOR-CON) 20 MEQ tablet Take 40 mEq by mouth daily.   . rivaroxaban (XARELTO) 20 MG TABS tablet Take 20 mg by mouth daily with supper.   . [DISCONTINUED] colchicine 0.6 MG tablet Take 0.6 mg by mouth daily.  . [DISCONTINUED] diltiazem (CARDIZEM CD) 180 MG 24 hr capsule Take 1 capsule (180 mg total) by mouth daily.   No facility-administered encounter medications on file as of 12/19/2017.     Review of Systems  GENERAL: No change in appetite, no fatigue, no weight  changes, no fever, chills or weakness SKIN: Denies rash, itching, wounds, ulcer sores, or nail abnormalities MOUTH and THROAT: Denies oral discomfort, gingival pain or bleeding RESPIRATORY: no cough, SOB, DOE, wheezing, hemoptysis CARDIAC: No chest pain, edema or palpitations GI: No abdominal pain, diarrhea, constipation, heart burn, nausea or vomiting GU: Denies dysuria, frequency, hematuria, incontinence, or discharge PSYCHIATRIC: Denies feelings of depression or anxiety. No report of hallucinations, insomnia, paranoia, or agitation   Immunization History  Administered Date(s) Administered  . Influenza Whole 05/15/2010  . Influenza-Unspecified 02/17/2017  . Pneumococcal Polysaccharide-23 10/25/2015  . Pneumococcal-Unspecified 05/20/2014   Pertinent  Health Maintenance Due  Topic Date Due  . PNA vac Low Risk Adult (2 of 2 - PCV13) 10/24/2016  . INFLUENZA VACCINE  12/18/2017  . DEXA SCAN  01/19/2018 (Originally 10/11/2003)   Fall Risk  08/28/2017  Falls in the past year? No   Vitals:   12/19/17 1144  BP: 123/81  Pulse: 71  Resp: 20  Temp: (!) 97.3 F (36.3 C)  Weight: 168 lb 12.8 oz (76.6 kg)  Height: 5\' 7"  (1.702 m)   Body mass index is 26.44 kg/m.  Physical Exam  GENERAL APPEARANCE: Well nourished. In no acute distress. Normal body habitus SKIN:  Skin is warm and dry.  MOUTH and THROAT: Tongue has whitish coating RESPIRATORY: Breathing is even & unlabored, BS CTAB CARDIAC: Irregularly irregular, no murmur,no extra heart sounds, no edema GI: Abdomen soft, normal BS, no masses, no tenderness EXTREMITIES:  Able to move X 4 extremities PSYCHIATRIC: Alert and oriented X 3. Affect and behavior are appropriate   Labs reviewed: Recent Labs    12/24/16 0849 12/31/16 1233 07/25/17 09/19/17  NA 143 145* 144 140  K 4.0 4.1 4.1 3.8  CL 107* 106  --   --   CO2 27 24  --   --   GLUCOSE 95 100*  --   --   BUN 21 20 20  22*  CREATININE 1.06* 1.03* 1.1 0.8  CALCIUM 9.7 9.4   --   --   MG 1.6  --   --   --    Recent Labs    12/24/16 0849 09/19/17  AST 16 14  ALT 9 8  ALKPHOS 136* 119  BILITOT 0.6  --   PROT 7.0  --   ALBUMIN 3.8  --    Recent Labs    12/24/16 0849 09/19/17  WBC 5.6 7.8  NEUTROABS 2.8 3  HGB 13.4 13.5  HCT 39.2 42  MCV 83  --   PLT 167 167   Lab Results  Component Value Date   TSH 1.110 12/24/2016   Lab Results  Component Value Date   HGBA1C 6.2 07/25/2017   Lab Results  Component Value Date   CHOL 151 09/19/2017   HDL 41 09/19/2017   LDLCALC 89 09/19/2017   TRIG 106 09/19/2017   CHOLHDL 3.9 Ratio 05/15/2010  Assessment/Plan  1. Oral candida - will start Clotrimazole troche 10 mg dissolve in mouth slowly 5X/day X 14 days, oral care daily   2. Persistent atrial fibrillation (HCC) - rate-controlled, continue Xarelto 20 mg 1 tab Q evening, Metoprolol succinate ER 200 mg 1 tab daily, Diltiazem 12 HR ER 90 mg 1 capsule daily   3. Essential hypertension - well-controlled, continue Metoprolol succinate ER 200 mg 1 tab daily, Diltiazem 12 HR ER 90 mg 1 capsule daily   4. Chronic gout without tophus, unspecified cause, unspecified site - stable, continue Allopurinol 300 mg 1 tab daily   5. HYPERCHOLESTEROLEMIA - continue Atorvastatin 40 mg 1 tab daily Lab Results  Component Value Date   CHOL 151 09/19/2017   HDL 41 09/19/2017   LDLCALC 89 09/19/2017   TRIG 106 09/19/2017   CHOLHDL 3.9 Ratio 05/15/2010       Family/ staff Communication: Discussed plan of care with resident.  Labs/tests ordered:  None  Goals of care:   Long-term care   Kenard Gower, NP [facility] [phone] (Monday-Friday 8:00 a.m. - 5:00 p.m.) [phone] (after hours)

## 2017-12-22 DIAGNOSIS — M1711 Unilateral primary osteoarthritis, right knee: Secondary | ICD-10-CM | POA: Diagnosis not present

## 2017-12-22 DIAGNOSIS — M6281 Muscle weakness (generalized): Secondary | ICD-10-CM | POA: Diagnosis not present

## 2017-12-23 DIAGNOSIS — M1711 Unilateral primary osteoarthritis, right knee: Secondary | ICD-10-CM | POA: Diagnosis not present

## 2017-12-23 DIAGNOSIS — M6281 Muscle weakness (generalized): Secondary | ICD-10-CM | POA: Diagnosis not present

## 2018-01-04 ENCOUNTER — Encounter: Payer: Self-pay | Admitting: Physician Assistant

## 2018-01-04 NOTE — Progress Notes (Deleted)
Cardiology Office Note    Date:  01/04/2018  ID:  Regina Sandoval, DOB [birth year], MRN [id number] PCP:  Hendricks Limes, MD  Cardiologist:  Larae Grooms, MD   Chief Complaint: f/u afib  History of Present Illness:  Regina Sandoval is a 77 y.o. female with history of persistent atrial fib (managed with rate control), alcoholism in remission, chronic diastolic CHF, dementia, CVA, CKD III, HTN, HLD (followed by PCP), seizure disorder, subclinical hyperthyroidism, bradycardia who presents for 6 month follow-up.  In December 2017 she was diagnosed with new onset atrial fib. She's been treated with Xarelto and rate control. Her med changes in 2018 were somewhat tricky to follow. At [facility] 05/2016 it was suggested she add diltiazem to her regimen; she was also on both carvedilol and metoprolol at that visit. Toprol '200mg'$  was continued but rx for diltiazem was not sent in at that time. When seen by Ermalinda Barrios PA-C in follow-up 06/2016, EKG in 06/2016 showed sinus bradycardia with HR logged as 51 at that visit (diltiazem was not on her medicine list). When seen by Dr. Irish Lack in 09/2016, HR logged as 68bpm but exam documented as irregular and tachycardic with again recommendation to add diltiazem '120mg'$  daily. She saw Lyda Jester PA-C on 12/06/16 for regular follow-up at which time she had continued 2+ LEE and RVR 133bpm. She had not taken her meds yet that day. Lasix '20mg'$  daily was added. Per Brittainy's note, it had previously been decided she is not a candidate for amiodarone or cardioversion. At last OV 03/2017 with Dr. Irish Lack, he noted HR was borderline suboptimal but felt appropriate given that she was feeling fine. Last labs 09/2017 with normal LFTS, Hgb 13.5, BUN 22, Cr 0.8, glucose 99, LDL 89, K 3.8, Na 140, 07/2017 A1C 6.2.  I met 2018  Persistent afib Chronic diastolic CHF HTN Bradycardia      Past Medical History:  Diagnosis Date  . Alcohol dependence in remission ([place])   . Arthritis    . Bradycardia   . Cataract   . Chronic anticoagulation    Xarelto  . Chronic diastolic heart failure ([place])   . CKD (chronic kidney disease), stage III ([place])   . CVA (cerebral infarction)   . Dementia   . Edema   . Gout   . Hyperlipidemia   . Hypertension   . Murmur   . Osteoarthrosis, generalized, involving multiple sites    INVOLVING RIGHT HIP ,KNEE. AND ANKLE  . Persistent atrial fibrillation ([place])    a. Dx 04/2016, on anticoag, not felt to be a candidate for amiodarone or cardioversion.  . Seizure disorder ([place])   . Subclinical hyperthyroidism   . Vitamin D deficiency     No past surgical history on file.  Current Medications: No outpatient medications have been marked as taking for the 01/05/18 encounter (Appointment) with Charlie Pitter, PA-C.   ***   Allergies:   Penicillins   Social History   Socioeconomic History  . Marital status: Divorced    Spouse name: Not on file  . Number of children: Not on file  . Years of education: Not on file  . Highest education level: Not on file  Occupational History  . Not on file  Social Needs  . Financial resource strain: Not hard at all  . Food insecurity:    Worry: Never true    Inability: Never true  . Transportation needs:    Medical: No    Non-medical: No  Tobacco  Use  . Smoking status: Former Smoker    Packs/day: 1.00    Years: 50.00    Pack years: 50.00    Types: Cigarettes  . Smokeless tobacco: Never Used  Substance and Sexual Activity  . Alcohol use: No    Alcohol/week: 0.0 standard drinks  . Drug use: No  . Sexual activity: Not on file  Lifestyle  . Physical activity:    Days per week: 0 days    Minutes per session: 0 min  . Stress: Only a little  Relationships  . Social connections:    Talks on phone: Once a week    Gets together: Once a week    Attends religious service: Never    Active member of club or organization: No    Attends meetings of clubs or organizations: Never    Relationship  status: Divorced  Other Topics Concern  . Not on file  Social History Narrative   Lived with daughter at home prior to SNF placement     Family History:  The patient's ***family history includes Asthma in her grandchild; Diabetes in her daughter; Hypertension in her father.  ROS:   Please see the history of present illness. Otherwise, review of systems is positive for ***.  All other systems are reviewed and otherwise negative.    PHYSICAL EXAM:   VS:  There were no vitals taken for this visit.  BMI: There is no height or weight on file to calculate BMI. GEN: Well nourished, well developed, in no acute distress HEENT: normocephalic, atraumatic Neck: no JVD, carotid bruits, or masses Cardiac: ***RRR; no murmurs, rubs, or gallops, no edema  Respiratory:  clear to auscultation bilaterally, normal work of breathing GI: soft, nontender, nondistended, + BS MS: no deformity or atrophy Skin: warm and dry, no rash Neuro:  Alert and Oriented x 3, Strength and sensation are intact, follows commands Psych: euthymic mood, full affect  Wt Readings from Last 3 Encounters:  12/19/17 168 lb 12.8 oz (76.6 kg)  11/19/17 172 lb 6.4 oz (78.2 kg)  11/03/17 172 lb (78 kg)      Studies/Labs Reviewed:   EKG:  EKG was ordered today and personally reviewed by me and demonstrates *** EKG was not ordered today.***  Recent Labs: 09/19/2017: ALT 8; BUN 22; Creatinine 0.8; Hemoglobin 13.5; Platelets 167; Potassium 3.8; Sodium 140   Lipid Panel    Component Value Date/Time   CHOL 151 09/19/2017   TRIG 106 09/19/2017   HDL 41 09/19/2017   CHOLHDL 3.9 Ratio 05/15/2010 2108   VLDL 17 05/15/2010 2108   LDLCALC 89 09/19/2017    Additional studies/ records that were reviewed today include: Summarized above.***    ASSESSMENT & PLAN:   1. ***  Disposition: F/u with ***   Medication Adjustments/Labs and Tests Ordered: Current medicines are reviewed at length with the patient today.  Concerns  regarding medicines are outlined above. Medication changes, Labs and Tests ordered today are summarized above and listed in the Patient Instructions accessible in Encounters.   Signed, Charlie Pitter, PA-C  01/04/2018 8:07 AM    [address], [place], [place]  [address] Phone: [phone]; Fax: [phone]

## 2018-01-05 ENCOUNTER — Ambulatory Visit: Payer: Medicare Other | Admitting: Physician Assistant

## 2018-01-06 ENCOUNTER — Encounter: Payer: Self-pay | Admitting: Physician Assistant

## 2018-01-22 ENCOUNTER — Encounter: Payer: Self-pay | Admitting: Internal Medicine

## 2018-01-22 ENCOUNTER — Non-Acute Institutional Stay (SKILLED_NURSING_FACILITY): Payer: Medicare Other | Admitting: Internal Medicine

## 2018-01-22 DIAGNOSIS — I481 Persistent atrial fibrillation: Secondary | ICD-10-CM | POA: Diagnosis not present

## 2018-01-22 DIAGNOSIS — I5033 Acute on chronic diastolic (congestive) heart failure: Secondary | ICD-10-CM

## 2018-01-22 DIAGNOSIS — M1A9XX0 Chronic gout, unspecified, without tophus (tophi): Secondary | ICD-10-CM | POA: Diagnosis not present

## 2018-01-22 DIAGNOSIS — F0390 Unspecified dementia without behavioral disturbance: Secondary | ICD-10-CM

## 2018-01-22 DIAGNOSIS — I4819 Other persistent atrial fibrillation: Secondary | ICD-10-CM

## 2018-01-22 NOTE — Assessment & Plan Note (Addendum)
01/22/2018 unable to give the year No behavioral issues noted she refused to have the TSH & uric acid updated "until after the holidays"

## 2018-01-22 NOTE — Assessment & Plan Note (Signed)
01/22/2018 clinically no evidence of decompensation; trace edema present

## 2018-01-22 NOTE — Progress Notes (Signed)
    NURSING HOME LOCATION:  [facility] ROOM NUMBER:  103-A  CODE STATUS:  Full Code  PCP:  Pecola Lawless, MD  [address] [place] [address]  This is a nursing facility follow of chronic medical diagnoses.  Interim medical record and care since last [facility] visit was updated with review of diagnostic studies and change in clinical status since last visit were documented.  HPI: Patient is a permanent resident of the facility with diagnoses of history of alcohol abuse ,history of stroke, dyslipidemia, gout, CKD, COPD, chronic diastolic heart failure, chronic A. fib, and essential hypertension. Most recent labs were performed 09/19/2017 and included normal chemistries, CBC, and lipids except for BUN of 22.  Creatinine was normal. She is on Xarelto for the chronic A. fib.  Review of systems: Dementia invalidated responses.  She was unable to give the year.  Denied any active symptoms other than arthritis and bursitis intermittently in her ankles and knees.  Specifically denied any cardiac symptoms.  Constitutional: No fever, significant weight change, fatigue  Eyes: No redness, discharge, pain, vision change ENT/mouth: No nasal congestion,  purulent discharge, earache, change in hearing, sore throat  Cardiovascular: No chest pain, palpitations, paroxysmal nocturnal dyspnea, claudication, edema  Respiratory: No cough, sputum production, hemoptysis, DOE, significant snoring, apnea   Gastrointestinal: No heartburn, dysphagia, abdominal pain, nausea /vomiting, rectal bleeding, melena, change in bowels Genitourinary: No dysuria, hematuria, pyuria, incontinence, nocturia Dermatologic: No rash, pruritus, change in appearance of skin Neurologic: No dizziness, headache, syncope, seizures, numbness, tingling Psychiatric: No significant anxiety, depression, insomnia, anorexia Endocrine: No change in hair/skin/nails, excessive thirst, excessive hunger, excessive urination    Hematologic/lymphatic: No significant bruising, lymphadenopathy, abnormal bleeding Allergy/immunology: No itchy/watery eyes, significant sneezing, urticaria, angioedema  Physical exam:  Pertinent or positive findings: She has arcus senilis.  Sclera are muddy.  She has irregular fine facial hirsutism.  She has only a few remaining lower teeth.  Breath sounds are decreased.  Her heart rhythm and rate are slow and irregular.  Trace edema at the ankles.  Pedal pulses are decreased.  She is strong to opposition in all extremities.  There are dry sclerotic skin changes @ base of the toes.  General appearance: Adequately nourished; no acute distress, increased work of breathing is present.   Lymphatic: No lymphadenopathy about the head, neck, axilla. Eyes: No conjunctival inflammation or lid edema is present. There is no definite scleral icterus. Ears:  External ear exam shows no significant lesions or deformities.   Nose:  External nasal examination shows no deformity or inflammation. Nasal mucosa are pink and moist without lesions, exudates Oral exam:  Lips and gums are healthy appearing. There is no oropharyngeal erythema or exudate. Neck:  No thyromegaly, masses, tenderness noted.    Heart:  No gallop, murmur, click, rub .  Lungs:  without wheezes, rhonchi, rales, rubs. Abdomen: Bowel sounds are normal. Abdomen is soft and nontender with no organomegaly, hernias, masses. GU: Deferred  Extremities:  No cyanosis, clubbing  Skin: Warm & dry w/o tenting. No significant rash.  See summary under each active problem in the Problem List with associated updated therapeutic plan

## 2018-01-22 NOTE — Assessment & Plan Note (Addendum)
Rate controlled TSH update declined

## 2018-01-23 ENCOUNTER — Encounter: Payer: Self-pay | Admitting: Internal Medicine

## 2018-01-23 NOTE — Patient Instructions (Signed)
See assessment and plan under each diagnosis in the problem list and acutely for this visit 

## 2018-01-23 NOTE — Assessment & Plan Note (Signed)
Update of uric acid declined

## 2018-02-19 ENCOUNTER — Encounter: Payer: Self-pay | Admitting: Adult Health

## 2018-02-19 ENCOUNTER — Non-Acute Institutional Stay (SKILLED_NURSING_FACILITY): Payer: Medicare Other | Admitting: Adult Health

## 2018-02-19 DIAGNOSIS — I10 Essential (primary) hypertension: Secondary | ICD-10-CM | POA: Diagnosis not present

## 2018-02-19 DIAGNOSIS — I5032 Chronic diastolic (congestive) heart failure: Secondary | ICD-10-CM | POA: Diagnosis not present

## 2018-02-19 DIAGNOSIS — B370 Candidal stomatitis: Secondary | ICD-10-CM

## 2018-02-19 DIAGNOSIS — E7800 Pure hypercholesterolemia, unspecified: Secondary | ICD-10-CM

## 2018-02-19 DIAGNOSIS — I4819 Other persistent atrial fibrillation: Secondary | ICD-10-CM

## 2018-02-19 DIAGNOSIS — F0390 Unspecified dementia without behavioral disturbance: Secondary | ICD-10-CM

## 2018-02-19 NOTE — Progress Notes (Signed)
Location:  [facility] Room Number: 103-A Place of Service:  SNF (31) Provider:  Kenard Gower, NP  Patient Care Team: Pecola Lawless, MD as PCP - General (Internal Medicine) Corky Crafts, MD as PCP - Cardiology (Cardiology) Medina-Vargas, Margit Banda, NP as Nurse Practitioner (Internal Medicine)  Extended Emergency Contact Information Primary Emergency Contact: Colello-Thomas,Sherill Address: [address] [place], [place] [address] [place] Home Phone: [phone] Relation: Daughter  Code Status:  Full Code  Goals of care: Advanced Directive information Advanced Directives 12/19/2017  Does Patient Have a Medical Advance Directive? No  Would patient like information on creating a medical advance directive? -     Chief Complaint  Patient presents with  . Medical Management of Chronic Issues    Routine [facility] SN[facility] visit    HPI:  Regina Sandoval is a 77 y.o. female seen today for medical management of chronic diseases.  She is a long-term care resident of [facility].  She has a PMH of persistent atrial fibrillation, essential HTN, chronic diastolic congestive heart failure, COPD, stave 3 renal insufficiency, dyslipidemia, dementia, seizure disorder, gout, history of CVA, and history of alcohol abuse. She was seen in the room today and noted to have thick whitish coating on her tongue. She denies oral pain.      Past Medical History:  Diagnosis Date  . Alcohol dependence in remission ([facility])   . Arthritis   . Bradycardia   . Cataract   . Chronic anticoagulation    Xarelto  . Chronic diastolic heart failure ([facility])   . CKD (chronic kidney disease), stage III ([facility])   . CVA (cerebral infarction)   . Dementia ([facility])   . Edema   . Gout   . Hyperlipidemia   . Hypertension   . Murmur   . Osteoarthrosis, generalized, involving multiple sites    INVOLVING RIGHT HIP ,KNEE. AND ANKLE  . Persistent atrial  fibrillation    a. Dx 04/2016, on anticoag, not felt to be a candidate for amiodarone or cardioversion.  . Seizure disorder ([facility])   . Subclinical hyperthyroidism   . Vitamin D deficiency    History reviewed. No pertinent surgical history.  Allergies  Allergen Reactions  . Penicillins Hives    Outpatient Encounter Medications as of 02/19/2018  Medication Sig  . acetaminophen (TYLENOL) 500 MG tablet Take 1,000 mg by mouth 2 (two) times daily as needed (Arthritic pain).   Mar[place] Kitchen allopurinol (ZYLOPRIM) 300 MG tablet Take 300 mg by mouth daily.  [place] Kitchen atorvastatin (LIPITOR) 40 MG tablet Take 40 mg by mouth daily.   . bisacodyl (DULCOLAX) 10 MG suppository Place 10 mg rectally as needed for moderate constipation.  . Cholecalciferol (VITAMIN D3) 50000 units CAPS Take 1 capsule by mouth every 30 (thirty) days.  Mar[place] Kitchen diltiazem (CARDIZEM) 90 MG tablet Take 90 mg by mouth 2 (two) times daily.  Mar[place] Kitchen donepezil (ARICEPT) 10 MG tablet Take 10 mg by mouth at bedtime.   . fluticasone (FLONASE) 50 MCG/ACT nasal spray Place 1 spray into both nostrils daily as needed for allergies.   . furosemide (LASIX) 80 MG tablet Take 80 mg by mouth daily.   Mar[place] Kitchen ipratropium-albuterol (DUONEB) 0.5-2.5 (3) MG/3ML SOLN Take 3 mLs by nebulization 4 (four) times daily as needed (sob or wheezing).   . magnesium hydroxide (MILK OF MAGNESIA) 400 MG/5ML suspension Take by mouth daily as needed for mild constipation.  . metoprolol (  TOPROL-XL) 200 MG 24 hr tablet Take 200 mg by mouth daily.   Mar[place] Kitchen NUTRITIONAL SUPPLEMENT LIQD Take 120 mLs by mouth 2 (two) times daily. MedPass   . potassium chloride SA (K-DUR,KLOR-CON) 20 MEQ tablet Take 40 mEq by mouth daily.   . rivaroxaban (XARELTO) 20 MG TABS tablet Take 20 mg by mouth daily with supper.    No facility-administered encounter medications on file as of 02/19/2018.     Review of Systems  GENERAL: No change in appetite, no fatigue, no weight changes, no fever, chills or weakness MOUTH and THROAT:  Denies oral discomfort, gingival pain or bleeding RESPIRATORY: no cough, SOB, DOE, wheezing, hemoptysis CARDIAC: No chest pain, edema or palpitations GI: No abdominal pain, diarrhea, constipation, heart burn, nausea or vomiting GU: Denies dysuria, frequency, hematuria, incontinence, or discharge PSYCHIATRIC: Denies feelings of depression or anxiety. No report of hallucinations, insomnia, paranoia, or agitation   Immunization History  Administered Date(s) Administered  . Influenza Whole 05/15/2010  . Influenza-Unspecified 02/17/2017  . Pneumococcal Polysaccharide-23 10/25/2015  . Pneumococcal-Unspecified 05/20/2014   Pertinent  Health Maintenance Due  Topic Date Due  . DEXA SCAN  10/11/2003  . PNA vac Low Risk Adult (2 of 2 - PCV13) 10/24/2016  . INFLUENZA VACCINE  12/18/2017   Fall Risk  08/28/2017  Falls in the past year? No    Vitals:   02/19/18 1237  BP: 121/69  Pulse: 75  Resp: 19  Temp: 98.1 F (36.7 C)  TempSrc: Oral  Weight: 168 lb 12.8 oz (76.6 kg)  Height: 5\' 7"  (1.702 m)   Body mass index is 26.44 kg/m.  Physical Exam  GENERAL APPEARANCE: Well nourished. In no acute distress. Normal body habitus SKIN:  Skin is warm and dry.  MOUTH and THROAT: Lips are without lesions. Tongue has thick whitish coating NECK: supple, trachea midline, no neck masses, no thyroid tenderness, no thyromegaly LYMPHATICS: No LAN in the neck, no supraclavicular LAN RESPIRATORY: Breathing is even & unlabored, BS CTAB CARDIAC: RRR, no murmur,no extra heart sounds, no edema GI: Abdomen soft, normal BS, no masses, no tenderness EXTREMITIES:  Able to move X 4 extremities, BLE with generalized weakness NEUROLOGICAL: There is no tremor. Speech is clear PSYCHIATRIC: Alert to self and place, disoriented to time. Affect and behavior are appropriate   Labs reviewed: Recent Labs    07/25/17 09/19/17  NA 144 140  K 4.1 3.8  BUN 20 22*  CREATININE 1.1 0.8   Recent Labs    09/19/17  AST  14  ALT 8  ALKPHOS 119   Recent Labs    09/19/17  WBC 7.8  NEUTROABS 3  HGB 13.5  HCT 42  PLT 167   Lab Results  Component Value Date   TSH 1.110 12/24/2016   Lab Results  Component Value Date   HGBA1C 6.2 07/25/2017   Lab Results  Component Value Date   CHOL 151 09/19/2017   HDL 41 09/19/2017   LDLCALC 89 09/19/2017   TRIG 106 09/19/2017   CHOLHDL 3.9 Ratio 05/15/2010    Assessment/Plan  1. Oral candida - has thick white coating on tongue, will start nystatin 100,000 units/mL give 5 mL swish and spit 4 times daily x2 weeks oral care daily   2. Persistent atrial fibrillation - rate controlled, continue metoprolol succinate ER 200 mg 1 tab daily, diltiazem 12-hour ER 90 mg 1 capsule twice a day, and Xarelto 20 mg 1 tab Q evening   3. Essential hypertension -well-controlled, continue metoprolol  succinate ER 200 mg 1 tab daily and diltiazem 12-hour ER 90 mg 1 capsule twice a day   4. HYPERCHOLESTEROLEMIA -  Continue Atorvastatin 20 mg 1 tab Q evening Lab Results  Component Value Date   CHOL 151 09/19/2017   HDL 41 09/19/2017   LDLCALC 89 09/19/2017   TRIG 106 09/19/2017   CHOLHDL 3.9 Ratio 05/15/2010     5. Chronic diastolic CHF (congestive heart failure) (HCC) - stable, continue Lasix 80 mg 1 tab daily and KCL ER 20 meq 2 tabs =40 meq daily   6. Dementia without behavioral disturbance, unspecified dementia type (HCC) - continue Donepezil 10 mg 1 tab Q HS    Family/ staff Communication: Discussed plan of care with resident.  Labs/tests ordered:  None  Goals of care:   Long-term care.   Kenard Gower, NP [facility] [phone] (Monday-Friday 8:00 a.m. - 5:00 p.m.) [phone] (after hours)

## 2018-03-24 ENCOUNTER — Non-Acute Institutional Stay (SKILLED_NURSING_FACILITY): Payer: Medicare Other | Admitting: Adult Health

## 2018-03-24 ENCOUNTER — Encounter: Payer: Self-pay | Admitting: Adult Health

## 2018-03-24 DIAGNOSIS — I4819 Other persistent atrial fibrillation: Secondary | ICD-10-CM | POA: Diagnosis not present

## 2018-03-24 DIAGNOSIS — E559 Vitamin D deficiency, unspecified: Secondary | ICD-10-CM

## 2018-03-24 DIAGNOSIS — I5032 Chronic diastolic (congestive) heart failure: Secondary | ICD-10-CM

## 2018-03-24 DIAGNOSIS — J309 Allergic rhinitis, unspecified: Secondary | ICD-10-CM

## 2018-03-24 DIAGNOSIS — I10 Essential (primary) hypertension: Secondary | ICD-10-CM | POA: Diagnosis not present

## 2018-03-24 DIAGNOSIS — H04123 Dry eye syndrome of bilateral lacrimal glands: Secondary | ICD-10-CM

## 2018-03-24 DIAGNOSIS — F0390 Unspecified dementia without behavioral disturbance: Secondary | ICD-10-CM | POA: Diagnosis not present

## 2018-03-24 NOTE — Progress Notes (Signed)
Location:  [facility] Room Number: 103-A Place of Service:  SNF (31) Provider:  Kenard Gower, NP  Patient Care Team: Pecola Lawless, MD as PCP - General (Internal Medicine) Corky Crafts, MD as PCP - Cardiology (Cardiology) Medina-Vargas, Margit Banda, NP as Nurse Practitioner (Internal Medicine)  Extended Emergency Contact Information Primary Emergency Contact: Valls-Thomas,Sherill Address: [address] [place], [place] [address] [place] Home Phone: [phone] Relation: Daughter  Code Status:  Full Code  Goals of care: Advanced Directive information Advanced Directives 12/19/2017  Does Patient Have a Medical Advance Directive? No  Would patient like information on creating a medical advance directive? -     Chief Complaint  Patient presents with  . Medical Management of Chronic Issues    Routine [facility] visit    HPI:  Pt is a 77 y.o. female seen today for medical management of chronic diseases.  She is a long-term care resident of [facility].  She has a PMH of persistent atrial fibrillation, essential hypertension, chronic diastolic congestive heart failure, COPD, stage 3 renal insufficiency, dyslipidemia, dementia, seizure disorder, gout, history of CVA, and history of alcohol abuse. She was seen by the ophthalmologist yesterday and reported that she was able to walk with the help of a wheelchair. She was recently treated with oral candida. Tongue is now clear of whitish coating. BPs are stable -119/69, 121/69, 127/64, 125/79. She was noted to be tearing during the visit. Denies any eye pain.    Past Medical History:  Diagnosis Date  . Alcohol dependence in remission (HCC)   . Arthritis   . Bradycardia   . Cataract   . Chronic anticoagulation    Xarelto  . Chronic diastolic heart failure (HCC)   . CKD (chronic kidney disease), stage III (HCC)   . CVA (cerebral infarction)    . Dementia (HCC)   . Edema   . Gout   . Hyperlipidemia   . Hypertension   . Murmur   . Osteoarthrosis, generalized, involving multiple sites    INVOLVING RIGHT HIP ,KNEE. AND ANKLE  . Persistent atrial fibrillation    a. Dx 04/2016, on anticoag, not felt to be a candidate for amiodarone or cardioversion.  . Seizure disorder (HCC)   . Subclinical hyperthyroidism   . Vitamin D deficiency    History reviewed. No pertinent surgical history.  Allergies  Allergen Reactions  . Penicillins Hives    Outpatient Encounter Medications as of 03/24/2018  Medication Sig  . acetaminophen (TYLENOL) 500 MG tablet Take 1,000 mg by mouth 2 (two) times daily as needed (Arthritic pain).   Marland Kitchen allopurinol (ZYLOPRIM) 300 MG tablet Take 300 mg by mouth daily.  Marland Kitchen atorvastatin (LIPITOR) 40 MG tablet Take 40 mg by mouth daily.   . bisacodyl (DULCOLAX) 10 MG suppository Place 10 mg rectally as needed for moderate constipation.  . Cholecalciferol (VITAMIN D3) 50000 units CAPS Take 1 capsule by mouth every 30 (thirty) days.  Marland Kitchen diltiazem (CARDIZEM) 90 MG tablet Take 90 mg by mouth 2 (two) times daily.  Marland Kitchen donepezil (ARICEPT) 10 MG tablet Take 10 mg by mouth at bedtime.   . fluticasone (FLONASE) 50 MCG/ACT nasal spray Place 1 spray into both nostrils daily as needed for allergies.   . furosemide (LASIX) 80 MG tablet Take 80 mg by mouth daily.   Marland Kitchen ipratropium-albuterol (DUONEB) 0.5-2.5 (3) MG/3ML SOLN Take 3 mLs by nebulization  4 (four) times daily as needed (sob or wheezing).   . magnesium hydroxide (MILK OF MAGNESIA) 400 MG/5ML suspension Take by mouth daily as needed for mild constipation.  . metoprolol (TOPROL-XL) 200 MG 24 hr tablet Take 200 mg by mouth daily.   Marland Kitchen NUTRITIONAL SUPPLEMENT LIQD Take 120 mLs by mouth 2 (two) times daily. MedPass   . potassium chloride SA (K-DUR,KLOR-CON) 20 MEQ tablet Take 40 mEq by mouth daily.   . rivaroxaban (XARELTO) 20 MG TABS tablet Take 20 mg by mouth daily with supper.      No facility-administered encounter medications on file as of 03/24/2018.     Review of Systems  GENERAL: No change in appetite, no fatigue, no weight changes, no fever, chills or weakness MOUTH and THROAT: Denies oral discomfort, gingival pain or bleeding, pain from teeth or hoarseness   RESPIRATORY: no cough, SOB, DOE, wheezing, hemoptysis CARDIAC: No chest pain, edema or palpitations PSYCHIATRIC: Denies feelings of depression or anxiety. No report of hallucinations, insomnia, paranoia, or agitation    Immunization History  Administered Date(s) Administered  . Influenza Whole 05/15/2010  . Influenza-Unspecified 02/17/2017  . Pneumococcal Polysaccharide-23 10/25/2015  . Pneumococcal-Unspecified 05/20/2014   Pertinent  Health Maintenance Due  Topic Date Due  . DEXA SCAN  10/11/2003  . PNA vac Low Risk Adult (2 of 2 - PCV13) 10/24/2016  . INFLUENZA VACCINE  12/18/2017   Fall Risk  08/28/2017  Falls in the past year? No     Vitals:   03/24/18 1006  BP: 119/69  Pulse: 70  Resp: 18  Temp: 98.2 F (36.8 C)  TempSrc: Oral  Weight: 163 lb 3.2 oz (74 kg)  Height: 5\' 7"  (1.702 m)   Body mass index is 25.56 kg/m.  Physical Exam  GENERAL APPEARANCE: Well nourished. In no acute distress. Normal body habitus SKIN:  Skin is warm and dry.  MOUTH and THROAT: Lips are without lesions. Oral mucosa is moist and without lesions. Tongue is normal in shape, size, and color and without lesions RESPIRATORY: Breathing is even & unlabored, BS CTAB CARDIAC:Irregularly irregular, no murmur,no extra heart sounds, no edema GI: Abdomen soft, normal BS, no masses, no tenderness EXTREMITIES:  Able to move X 4 extremities NEUROLOGICAL: There is no tremor. Speech is clear PSYCHIATRIC: Alert and oriented X 3. Affect and behavior are appropriate    Labs reviewed: Recent Labs    07/25/17 09/19/17  NA 144 140  K 4.1 3.8  BUN 20 22*  CREATININE 1.1 0.8   Recent Labs    09/19/17  AST 14   ALT 8  ALKPHOS 119   Recent Labs    09/19/17  WBC 7.8  NEUTROABS 3  HGB 13.5  HCT 42  PLT 167   Lab Results  Component Value Date   TSH 1.110 12/24/2016   Lab Results  Component Value Date   HGBA1C 6.2 07/25/2017   Lab Results  Component Value Date   CHOL 151 09/19/2017   HDL 41 09/19/2017   LDLCALC 89 09/19/2017   TRIG 106 09/19/2017   CHOLHDL 3.9 Ratio 05/15/2010     Assessment/Plan  1. Dry eyes, bilateral -  Noted to be tearing up, will start on artificial tears 2 drops to both eyes twice daily  2. Essential hypertension -well-controlled, continue metoprolol succinate ER 200 mg 1 tab daily diltiazem 12-hour ER 90 mg 1 capsule twice a day   3. Allergic rhinitis, unspecified seasonality, unspecified trigger -stable, continue fluticasone propionate 50 mcg spray  administer 1 spray to each nostril daily as needed   4. Chronic diastolic heart failure (HCC) -no SOB, continue Lasix 80 mg 1 tab daily, KCl ER 20 MEQ 2 tabs = 40  meq daily, metoprolol succinate ER 200 mg 1 tab daily    5. Vitamin D deficiency  -continue vitamin D3 50,000 units 1 capsule monthly   6. Persistent atrial fibrillation  -rate controlled, continue Xarelto 20 mg 1 tab q. evening, metoprolol succinate ER 200 mg 1 tab daily, diltiazem 12-hour ER 90 mg 1 capsule twice a day   7. Dementia without behavioral disturbance, unspecified dementia type (HCC) -continue donepezil 10 mg 1 tab nightly, supportive care, and fall precautions    Family/ staff Communication: Discussed plan of care with resident.  Labs/tests ordered: CBC and BMP  Goals of care:   Long-term care.   Kenard Gower, NP [facility] [phone] (Monday-Friday 8:00 a.m. - 5:00 p.m.) [phone] (after hours)

## 2018-03-25 DIAGNOSIS — D649 Anemia, unspecified: Secondary | ICD-10-CM | POA: Diagnosis not present

## 2018-03-25 DIAGNOSIS — Z79899 Other long term (current) drug therapy: Secondary | ICD-10-CM | POA: Diagnosis not present

## 2018-03-26 DIAGNOSIS — Z79899 Other long term (current) drug therapy: Secondary | ICD-10-CM | POA: Diagnosis not present

## 2018-03-26 DIAGNOSIS — D649 Anemia, unspecified: Secondary | ICD-10-CM | POA: Diagnosis not present

## 2018-03-26 LAB — CBC AND DIFFERENTIAL
HCT: 39 (ref 36–46)
HEMOGLOBIN: 12.8 (ref 12.0–16.0)
NEUTROS ABS: 4
PLATELETS: 133 — AB (ref 150–399)
WBC: 6.9

## 2018-03-26 LAB — BASIC METABOLIC PANEL
BUN: 24 — AB (ref 4–21)
Creatinine: 0.9 (ref 0.5–1.1)
Glucose: 92
Potassium: 3.9 (ref 3.4–5.3)
Sodium: 143 (ref 137–147)

## 2018-03-30 DIAGNOSIS — Z79899 Other long term (current) drug therapy: Secondary | ICD-10-CM | POA: Diagnosis not present

## 2018-03-30 DIAGNOSIS — D649 Anemia, unspecified: Secondary | ICD-10-CM | POA: Diagnosis not present

## 2018-03-30 LAB — CBC AND DIFFERENTIAL
HEMATOCRIT: 38 (ref 36–46)
Hemoglobin: 12.7 (ref 12.0–16.0)
NEUTROS ABS: 4
Platelets: 133 — AB (ref 150–399)
WBC: 7.8

## 2018-03-30 LAB — BASIC METABOLIC PANEL
BUN: 24 — AB (ref 4–21)
Creatinine: 0.9 (ref 0.5–1.1)
GLUCOSE: 89
Potassium: 3.8 (ref 3.4–5.3)
SODIUM: 144 (ref 137–147)

## 2018-04-23 ENCOUNTER — Non-Acute Institutional Stay (SKILLED_NURSING_FACILITY): Payer: Medicare Other | Admitting: Internal Medicine

## 2018-04-23 ENCOUNTER — Encounter: Payer: Self-pay | Admitting: Internal Medicine

## 2018-04-23 DIAGNOSIS — I10 Essential (primary) hypertension: Secondary | ICD-10-CM

## 2018-04-23 DIAGNOSIS — I4819 Other persistent atrial fibrillation: Secondary | ICD-10-CM

## 2018-04-23 DIAGNOSIS — F0390 Unspecified dementia without behavioral disturbance: Secondary | ICD-10-CM

## 2018-04-23 DIAGNOSIS — M1A9XX0 Chronic gout, unspecified, without tophus (tophi): Secondary | ICD-10-CM

## 2018-04-23 NOTE — Assessment & Plan Note (Signed)
Uric acid will be rechecked; may be possible to reduce the allopurinol dose

## 2018-04-23 NOTE — Patient Instructions (Signed)
See assessment and plan under each diagnosis in the problem list and acutely for this visit 

## 2018-04-23 NOTE — Assessment & Plan Note (Signed)
Her only behavioral issues are remaining in bed

## 2018-04-23 NOTE — Progress Notes (Signed)
NURSING HOME LOCATION:  [facility] ROOM NUMBER:  103-A  CODE STATUS:  Full Code  PCP:  Pecola Lawless, MD  [address] [place] [address]  This is a nursing facility follow up of chronic medical diagnoses  Interim medical record and care since last [facility] visit was updated with review of diagnostic studies and change in clinical status since last visit were documented.  HPI: The patient is a permanent resident of the facility with medical diagnoses of essential hypertension, chronic A. fib, chronic diastolic heart failure, COPD, CKD, gout, dyslipidemia, history of stroke, and history of alcohol abuse. Labs are current except for TSH which was 1.11 on 12/24/2016. Uric acid was 4 on 08/05/17; colchicine was D/Ced @ that time.  Review of systems: Dementia invalidated responses.  All answers were negative except for possibly some depression  "but not much" &  "some arthritis pain in her legs". She states that she is ambulatory; but her roommate and the SNF staff report that she is bedridden.   Constitutional: No fever, significant weight change, fatigue  Eyes: No redness, discharge, pain, vision change ENT/mouth: No nasal congestion,  purulent discharge, earache, change in hearing, sore throat  Cardiovascular: No chest pain, palpitations, paroxysmal nocturnal dyspnea, claudication, edema  Respiratory: No cough, sputum production, hemoptysis, DOE, significant snoring, apnea   Gastrointestinal: No heartburn, dysphagia, abdominal pain, nausea /vomiting, rectal bleeding, melena, change in bowels Genitourinary: No dysuria, hematuria, pyuria, incontinence, nocturia Dermatologic: No rash, pruritus, change in appearance of skin Neurologic: No dizziness, headache, syncope, seizures, numbness, tingling Endocrine: No change in hair/skin/nails, excessive thirst, excessive hunger, excessive urination  Hematologic/lymphatic: No significant bruising, lymphadenopathy, abnormal  bleeding Allergy/immunology: No itchy/watery eyes, significant sneezing, urticaria, angioedema  Physical exam:  Pertinent or positive findings: She lies in bed leaning to the right with her upper torso almost off the mattress.  I was concerned she may fall off the bed and helped her move to the middle of the bed; but she repeatedly continue to lean to the right.  Sclera are muddy. Anisocoria is present.  OS pupil is pinpoint and the right pupil small.  She has only four lower teeth left.  Heart rhythm is slow and irregular.  Breath sounds are decreased especially on the left compared to the right.  Pedal pulses are decreased.  She has flexion contractures of her toes.  There is atrophy of the calf muscles.  She is strong to opposition the upper extremities with weakness in the lower extremities.  Skin over the shins and feet is dry and cracked.  General appearance: Adequately nourished; no acute distress, increased work of breathing is present.   Lymphatic: No lymphadenopathy about the head, neck, axilla. Eyes: No conjunctival inflammation or lid edema is present. There is no scleral icterus. Ears:  External ear exam shows no significant lesions or deformities.   Nose:  External nasal examination shows no deformity or inflammation. Nasal mucosa are pink and moist without lesions, exudates Oral exam:  Lips and gums are healthy appearing. There is no oropharyngeal erythema or exudate. Neck:  No thyromegaly, masses, tenderness noted.    Heart:  No gallop, murmur, click, rub .  Lungs:  without wheezes, rhonchi, rales, rubs. Abdomen: Bowel sounds are normal. Abdomen is soft and nontender with no organomegaly, hernias, masses. GU: Deferred  Extremities:  No cyanosis, clubbing, edema  Neurologic exam : Balance, Rhomberg, finger to nose testing could not be completed due to clinical state Skin: Warm &  dry w/o tenting. No significant lesions or rash.  See summary under each active problem in the  Problem List with associated updated therapeutic plan

## 2018-04-23 NOTE — Assessment & Plan Note (Signed)
BP controlled; no change in antihypertensive medications  

## 2018-04-23 NOTE — Assessment & Plan Note (Signed)
Heart rate is markedly slow; TSH will be checked

## 2018-04-24 ENCOUNTER — Encounter: Payer: Self-pay | Admitting: Internal Medicine

## 2018-05-21 ENCOUNTER — Non-Acute Institutional Stay (SKILLED_NURSING_FACILITY): Payer: Medicare Other | Admitting: Adult Health

## 2018-05-21 ENCOUNTER — Encounter: Payer: Self-pay | Admitting: Adult Health

## 2018-05-21 DIAGNOSIS — I4819 Other persistent atrial fibrillation: Secondary | ICD-10-CM

## 2018-05-21 DIAGNOSIS — J449 Chronic obstructive pulmonary disease, unspecified: Secondary | ICD-10-CM

## 2018-05-21 DIAGNOSIS — I10 Essential (primary) hypertension: Secondary | ICD-10-CM | POA: Diagnosis not present

## 2018-05-21 DIAGNOSIS — M1A9XX0 Chronic gout, unspecified, without tophus (tophi): Secondary | ICD-10-CM

## 2018-05-21 DIAGNOSIS — I5032 Chronic diastolic (congestive) heart failure: Secondary | ICD-10-CM | POA: Diagnosis not present

## 2018-05-21 DIAGNOSIS — F0390 Unspecified dementia without behavioral disturbance: Secondary | ICD-10-CM

## 2018-05-21 NOTE — Progress Notes (Signed)
Location:  [facility] Room Number: 103-A Place of Service:  SNF (31) Provider:  Kenard GowerMedina-Vargas, , NP  Patient Care Team: Pecola LawlessHopper, William F, MD as PCP - General (Internal Medicine) Corky CraftsVaranasi, Jayadeep S, MD as PCP - Cardiology (Cardiology) Medina-Vargas, Margit Banda C, NP as Nurse Practitioner (Internal Medicine)  Extended Emergency Contact Information Primary Emergency Contact: Batie-Thomas,Sherill Address: [address]          [place], [place] [address] [place] Home Phone: [phone] Relation: Daughter  Code Status:  Full Code  Goals of care: Advanced Directive information Advanced Directives 12/19/2017  Does Patient Have a Medical Advance Directive? No  Would patient like information on creating a medical advance directive? -     Chief Complaint  Patient presents with  . Medical Management of Chronic Issues    Routine [facility] visit    HPI:  Pt is a 78 y.o. female seen today for medical management of chronic diseases.  She is a [facility].  She has a PMH of persistent atrial fibrillation, essential hypertension, chronic diastolic congestive heart failure, COPD, stage 3 renal insufficiency, dyslipidemia, dementia, seizure disorder, gout, history of CVA, and history of alcohol abuse. She was seen in her room today. She denies concerns but requested for multivitamins with minerals daily. No reported SOB nor wheezing.     Past Medical History:  Diagnosis Date  . Alcohol dependence in remission (HCC)   . Arthritis   . Bradycardia   . Cataract   . Chronic anticoagulation    Xarelto  . Chronic diastolic heart failure (HCC)   . CKD (chronic kidney disease), stage III (HCC)   . CVA (cerebral infarction)   . Dementia (HCC)   . Edema   . Gout   . Hyperlipidemia   . Hypertension   . Murmur   . Osteoarthrosis, generalized, involving multiple sites    INVOLVING RIGHT HIP  ,KNEE. AND ANKLE  . Persistent atrial fibrillation    a. Dx 04/2016, on anticoag, not felt to be a candidate for amiodarone or cardioversion.  . Seizure disorder (HCC)   . Subclinical hyperthyroidism   . Vitamin D deficiency    No past surgical history on file.  Allergies  Allergen Reactions  . Penicillins Hives    Outpatient Encounter Medications as of 05/21/2018  Medication Sig  . acetaminophen (TYLENOL) 500 MG tablet Take 1,000 mg by mouth 2 (two) times daily as needed (Arthritic pain).   Marland Kitchen. allopurinol (ZYLOPRIM) 300 MG tablet Take 300 mg by mouth daily.  Marland Kitchen. atorvastatin (LIPITOR) 40 MG tablet Take 40 mg by mouth daily.   . bisacodyl (DULCOLAX) 10 MG suppository Place 10 mg rectally as needed for moderate constipation.  . Cholecalciferol (VITAMIN D3) 50000 units CAPS Take 1 capsule by mouth every 30 (thirty) days.  Marland Kitchen. diltiazem (CARDIZEM) 90 MG tablet Take 90 mg by mouth 2 (two) times daily.  Marland Kitchen. donepezil (ARICEPT) 10 MG tablet Take 10 mg by mouth at bedtime.   . fluticasone (FLONASE) 50 MCG/ACT nasal spray Place 1 spray into both nostrils daily as needed for allergies.   . furosemide (LASIX) 80 MG tablet Take 80 mg by mouth daily.   . hydroxypropyl methylcellulose / hypromellose (ISOPTO TEARS / GONIOVISC) 2.5 % ophthalmic solution Place 2 drops into both eyes 2 (two) times daily.  Marland Kitchen. ipratropium-albuterol (DUONEB) 0.5-2.5 (3) MG/3ML SOLN Take 3 mLs by nebulization 4 (four) times daily as needed (sob or  wheezing).   . magnesium hydroxide (MILK OF MAGNESIA) 400 MG/5ML suspension Take by mouth daily as needed for mild constipation.  . metoprolol (TOPROL-XL) 200 MG 24 hr tablet Take 200 mg by mouth daily.   Marland Kitchen. NUTRITIONAL SUPPLEMENT LIQD Take 120 mLs by mouth 2 (two) times daily. MedPass   . potassium chloride SA (K-DUR,KLOR-CON) 20 MEQ tablet Take 40 mEq by mouth daily.   . rivaroxaban (XARELTO) 20 MG TABS tablet Take 20 mg by mouth daily with supper.   . traZODone (DESYREL) 50 MG tablet  Take 25 mg by mouth at bedtime. Give 1/2 tablet QHS to = 25 mg   No facility-administered encounter medications on file as of 05/21/2018.     Review of Systems  GENERAL: No change in appetite, no fatigue, no weight changes, no fever, chills or weakness MOUTH and THROAT: Denies oral discomfort, gingival pain or bleeding RESPIRATORY: no cough, SOB, DOE, wheezing, hemoptysis CARDIAC: No chest pain, edema or palpitations GI: No abdominal pain, diarrhea, constipation, heart burn, nausea or vomiting GU: Denies dysuria, frequency, hematuria, incontinence, or discharge PSYCHIATRIC: Denies feelings of depression or anxiety. No report of hallucinations, insomnia, paranoia, or agitation    Immunization History  Administered Date(s) Administered  . Influenza Whole 05/15/2010  . Influenza-Unspecified 02/17/2017  . Pneumococcal Polysaccharide-23 10/25/2015  . Pneumococcal-Unspecified 05/20/2014   Pertinent  Health Maintenance Due  Topic Date Due  . DEXA SCAN  10/11/2003  . PNA vac Low Risk Adult (2 of 2 - PCV13) 10/24/2016  . INFLUENZA VACCINE  12/18/2017   Fall Risk  08/28/2017  Falls in the past year? No   Body mass index is 26.63 kg/m.   Physical Exam    GENERAL APPEARANCE: Well nourished. In no acute distress. Normal body habitus SKIN:  Skin is warm and dry.  MOUTH and THROAT: Lips are without lesions. Oral mucosa is moist and without lesions.  RESPIRATORY: Breathing is even & unlabored, BS CTAB CARDIAC: Irregularly irregular, no murmur,no extra heart sounds, no edema GI: Abdomen soft, normal BS, no masses, no tenderness EXTREMITIES:  Able to move X 4 extremities NEUROLOGICAL: There is no tremor. Speech is clear. Alert to self and place, disoriented to time. PSYCHIATRIC:  Affect and behavior are appropriate   Labs reviewed: Recent Labs    09/19/17 03/26/18 03/30/18  NA 140 143 144  K 3.8 3.9 3.8  BUN 22* 24* 24*  CREATININE 0.8 0.9 0.9   Recent Labs    09/19/17  AST 14    ALT 8  ALKPHOS 119   Recent Labs    09/19/17 03/26/18 03/30/18  WBC 7.8 6.9 7.8  NEUTROABS 3 4 4   HGB 13.5 12.8 12.7  HCT 42 39 38  PLT 167 133* 133*   Lab Results  Component Value Date   TSH 1.110 12/24/2016   Lab Results  Component Value Date   HGBA1C 6.2 07/25/2017   Lab Results  Component Value Date   CHOL 151 09/19/2017   HDL 41 09/19/2017   LDLCALC 89 09/19/2017   TRIG 106 09/19/2017   CHOLHDL 3.9 Ratio 05/15/2010    Assessment/Plan  1. Persistent atrial fibrillation - rate-controlled, continue Metoprolol Succinate ER 200 mg 1 tab daily, continue Diltiazem 12HR ER 90 mg 1 capsule twice a day, Xarelto 20 mg 1 tab Q evening   2. Chronic diastolic heart failure (HCC) - stable, continue Lasix 80 mg 1 tab daily and KCL ER 20 meq 2 tabs = 40 meq daily, Metoprolol Succinate ER 200  mg 1 tab daily   3. Essential hypertension - well-controlled, continue  Metoprolol Succinate ER 200 mg 1 tab daily, continue Diltiazem 12HR ER 90 mg 1 capsule twice a day   4. Chronic gout without tophus, unspecified cause, unspecified site - no flare ups, continue Allopurinol 300 mg 1 tab daily   5. COPD -  No SOB, continue Ipratropium-Albuterol PRN   6. Dementia -  continue Donepezil 10 mg 1 tab Q HS, supportive care, fall precautions      Family/ staff Communication:  Discussed plan of care with resident.  Labs/tests ordered:  None  Goals of care:   Long-term care.   Kenard Gower, NP [facility] [phone] (Monday-Friday 8:00 a.m. - 5:00 p.m.) [phone] (after hours)

## 2018-05-29 LAB — BASIC METABOLIC PANEL
BUN: 36 — AB (ref 4–21)
Creatinine: 1.1 (ref 0.5–1.1)
GLUCOSE: 126
POTASSIUM: 4.6 (ref 3.4–5.3)
Sodium: 140 (ref 137–147)

## 2018-05-29 LAB — CBC AND DIFFERENTIAL
HEMATOCRIT: 44 (ref 36–46)
Hemoglobin: 14.5 (ref 12.0–16.0)
Neutrophils Absolute: 12
PLATELETS: 187 (ref 150–399)
WBC: 15.4

## 2018-06-02 LAB — CBC AND DIFFERENTIAL
HCT: 41 (ref 36–46)
HEMOGLOBIN: 13.5 (ref 12.0–16.0)
NEUTROS ABS: 11
PLATELETS: 192 (ref 150–399)
WBC: 16

## 2018-06-18 ENCOUNTER — Other Ambulatory Visit: Payer: Self-pay | Admitting: Adult Health

## 2018-06-18 ENCOUNTER — Non-Acute Institutional Stay (SKILLED_NURSING_FACILITY): Payer: Medicare Other | Admitting: Adult Health

## 2018-06-18 ENCOUNTER — Encounter: Payer: Self-pay | Admitting: Adult Health

## 2018-06-18 DIAGNOSIS — R05 Cough: Secondary | ICD-10-CM | POA: Diagnosis not present

## 2018-06-18 DIAGNOSIS — R630 Anorexia: Secondary | ICD-10-CM

## 2018-06-18 DIAGNOSIS — R102 Pelvic and perineal pain: Secondary | ICD-10-CM

## 2018-06-18 DIAGNOSIS — Z20828 Contact with and (suspected) exposure to other viral communicable diseases: Secondary | ICD-10-CM | POA: Diagnosis not present

## 2018-06-18 DIAGNOSIS — R059 Cough, unspecified: Secondary | ICD-10-CM

## 2018-06-18 LAB — BASIC METABOLIC PANEL
BUN: 63 — AB (ref 4–21)
CREATININE: 1.2 — AB (ref 0.5–1.1)
Glucose: 142
Potassium: 6 — AB (ref 3.4–5.3)
Sodium: 145 (ref 137–147)

## 2018-06-18 LAB — CBC AND DIFFERENTIAL
HEMATOCRIT: 43 (ref 36–46)
HEMOGLOBIN: 13.9 (ref 12.0–16.0)
Neutrophils Absolute: 17
PLATELETS: 192 (ref 150–399)
WBC: 20.1

## 2018-06-18 MED ORDER — TRAMADOL HCL 50 MG PO TABS: 50.0000 mg | ORAL_TABLET | Freq: Two times a day (BID) | ORAL | 0 refills | Status: AC | PRN

## 2018-06-18 NOTE — Progress Notes (Signed)
Location:  [facility] Room Number: 103-A Place of Service:  SNF (31) Provider:  Kenard GowerMedina-Vargas, Laurel Harnden, NP  Patient Care Team: Pecola LawlessHopper, William F, MD as PCP - General (Internal Medicine) Corky CraftsVaranasi, Jayadeep S, MD as PCP - Cardiology (Cardiology) Medina-Vargas, Margit BandaMonina C, NP as Nurse Practitioner (Internal Medicine)  Extended Emergency Contact Information Primary Emergency Contact: Landers-Thomas,Sherill Address: [address]          [place], [place] [address] [place] Home Phone: [phone] Relation: Daughter  Code Status:  Full Code  Goals of care: Advanced Directive information Advanced Directives 12/19/2017  Does Patient Have a Medical Advance Directive? No  Would patient like information on creating a medical advance directive? -     Chief Complaint  Patient presents with  . Acute Visit    Anorexia and complaints of pain    HPI:  Pt is a 78 y.o. female seen today for an acute visit due to anorexia and complaints of pain. She was reported to have poor appetite. She complains of pain when turned. She is being followed up by wound consultant due to unstageable sacral ulcer. She was noted to be coughing, no SOB and no fever.  She is a long-term care resident of [facility].  She has a PMH of persistent atrial fibrillation, essential hypertension, chronic diastolic congestive heart failure, COPD, stage 3 renal insufficiency, dyslipidemia, dementia, seizure disorder, gout, history of CVA, and history of alcohol abuse.   Past Medical History:  Diagnosis Date  . Alcohol dependence in remission (HCC)   . Arthritis   . Bradycardia   . Cataract   . Chronic anticoagulation    Xarelto  . Chronic diastolic heart failure (HCC)   . CKD (chronic kidney disease), stage III (HCC)   . CVA (cerebral infarction)   . Dementia (HCC)   . Edema   . Gout   . Hyperlipidemia   . Hypertension   . Murmur   . Osteoarthrosis,  generalized, involving multiple sites    INVOLVING RIGHT HIP ,KNEE. AND ANKLE  . Persistent atrial fibrillation    a. Dx 04/2016, on anticoag, not felt to be a candidate for amiodarone or cardioversion.  . Seizure disorder (HCC)   . Subclinical hyperthyroidism   . Vitamin D deficiency    History reviewed. No pertinent surgical history.  Allergies  Allergen Reactions  . Penicillins Hives    Outpatient Encounter Medications as of 06/18/2018  Medication Sig  . acetaminophen (TYLENOL) 500 MG tablet Take 1,000 mg by mouth 2 (two) times daily as needed (Arthritic pain).   Marland Kitchen. allopurinol (ZYLOPRIM) 300 MG tablet Take 300 mg by mouth daily.  . Amino Acids-Protein Hydrolys (FEEDING SUPPLEMENT, PRO-STAT SUGAR FREE 64,) LIQD Take 30 mLs by mouth 2 (two) times daily.  Marland Kitchen. atorvastatin (LIPITOR) 40 MG tablet Take 40 mg by mouth daily.   . bisacodyl (DULCOLAX) 10 MG suppository Place 10 mg rectally as needed for moderate constipation.  . Cholecalciferol (VITAMIN D3) 50000 units CAPS Take 1 capsule by mouth every 30 (thirty) days.  Marland Kitchen. diltiazem (CARDIZEM) 90 MG tablet Take 90 mg by mouth 2 (two) times daily.  Marland Kitchen. donepezil (ARICEPT) 10 MG tablet Take 10 mg by mouth at bedtime.   . fluticasone (FLONASE) 50 MCG/ACT nasal spray Place 1 spray into both nostrils daily as needed for allergies.   . furosemide (LASIX) 80 MG tablet Take 80 mg by mouth daily.   Marland Kitchen. guaifenesin (ROBITUSSIN) 100 MG/5ML syrup Take  200 mg by mouth 3 (three) times daily.  . hydroxypropyl methylcellulose / hypromellose (ISOPTO TEARS / GONIOVISC) 2.5 % ophthalmic solution Place 2 drops into both eyes 2 (two) times daily.  Marland Kitchen ipratropium-albuterol (DUONEB) 0.5-2.5 (3) MG/3ML SOLN Take 3 mLs by nebulization 4 (four) times daily as needed (sob or wheezing).   . magnesium hydroxide (MILK OF MAGNESIA) 400 MG/5ML suspension Take by mouth daily as needed for mild constipation.  . metoprolol (TOPROL-XL) 200 MG 24 hr tablet Take 200 mg by mouth daily.    . mirtazapine (REMERON) 15 MG tablet Take 15 mg by mouth at bedtime.  . Multiple Vitamins-Minerals (MULTIVITAMIN WITH MINERALS) tablet Take 1 tablet by mouth daily.  Marland Kitchen NUTRITIONAL SUPPLEMENT LIQD Take 120 mLs by mouth 2 (two) times daily. MedPass   . oseltamivir (TAMIFLU) 75 MG capsule Take 75 mg by mouth daily.  . potassium chloride SA (K-DUR,KLOR-CON) 20 MEQ tablet Take 40 mEq by mouth daily.   . rivaroxaban (XARELTO) 20 MG TABS tablet Take 20 mg by mouth daily with supper.   . traMADol (ULTRAM) 50 MG tablet Take 50 mg by mouth every 12 (twelve) hours as needed for moderate pain.  . traZODone (DESYREL) 50 MG tablet Take 25 mg by mouth at bedtime. Give 1/2 tablet QHS to = 25 mg   No facility-administered encounter medications on file as of 06/18/2018.     Review of Systems  GENERAL: No change in appetite, no fatigue, no weight changes, no fever, chills or weakness SKIN: + ulcer sore MOUTH and THROAT: Denies oral discomfort, gingival pain or bleeding RESPIRATORY:+cough CARDIAC: No chest pain, edema or palpitations GI: No abdominal pain, diarrhea, constipation, heart burn, nausea or vomiting GU: Denies dysuria, frequency, hematuria, incontinence, or discharge PSYCHIATRIC: Denies feelings of depression or anxiety. No report of hallucinations, insomnia, paranoia, or agitation   Immunization History  Administered Date(s) Administered  . Influenza Whole 05/15/2010  . Influenza-Unspecified 02/17/2017  . Pneumococcal Polysaccharide-23 10/25/2015  . Pneumococcal-Unspecified 05/20/2014   Pertinent  Health Maintenance Due  Topic Date Due  . DEXA SCAN  10/11/2003  . PNA vac Low Risk Adult (2 of 2 - PCV13) 10/24/2016  . INFLUENZA VACCINE  12/18/2017   Fall Risk  08/28/2017  Falls in the past year? No     Vitals:   06/18/18 1507  BP: 120/68  Pulse: 78  Resp: 18  Temp: (!) 97.5 F (36.4 C)  TempSrc: Oral  SpO2: 100%  Weight: 159 lb (72.1 kg)  Height: 5\' 7"  (1.702 m)   Body  mass index is 24.9 kg/m.  Physical Exam  GENERAL APPEARANCE: Grimaces when turned SKIN: unstageable sacral ulcer MOUTH and THROAT: Lips are without lesions. Oral mucosa is moist and without lesions. Tongue is normal in shape, size, and color and without lesions RESPIRATORY: Breathing is even & unlabored, +crackles on bilateral lung fields CARDIAC: Irregularly irregular, no murmur,no extra heart sounds, no edema GI: Abdomen soft, normal BS, no masses, no tenderness EXTREMITIES: Able to move X 4 extremities, BLE with generalized weakness NEUROLOGICAL: There is no tremor. Speech is clear. Alert and oriented X 3. PSYCHIATRIC:  Affect and behavior are appropriate   Labs reviewed: Recent Labs    03/26/18 03/30/18 05/29/18  NA 143 144 140  K 3.9 3.8 4.6  BUN 24* 24* 36*  CREATININE 0.9 0.9 1.1   Recent Labs    09/19/17  AST 14  ALT 8  ALKPHOS 119   Recent Labs    03/30/18 05/29/18 06/02/18  WBC 7.8 15.4 16.0  NEUTROABS 4 12 11   HGB 12.7 14.5 13.5  HCT 38 44 41  PLT 133* 187 192   Lab Results  Component Value Date   TSH 1.110 12/24/2016   Lab Results  Component Value Date   HGBA1C 6.2 07/25/2017   Lab Results  Component Value Date   CHOL 151 09/19/2017   HDL 41 09/19/2017   LDLCALC 89 09/19/2017   TRIG 106 09/19/2017   CHOLHDL 3.9 Ratio 05/15/2010    Assessment/Plan  1. Poor appetite - has not been eating according to staff, will check BMP and CBC to check for dehydration, encourage to eat and drink  2. Pelvic pain - will start on Tylenol 325 mg 2 tabs=650 mg Q 6am, 2pm and 10 pm and Tramadol 50 mg Q 12 hours PRN  3. Cough - will start Robitussin 100 mg/275ml give 10 ml Q 6AM, 2PM and 10 PM X 1 week, will do chest x-ray and CBC to rule out pneumonia  4. Exposure to the flu - continue Tamiflu   Family/ staff Communication: Discussed plan of care with resident.  Labs/tests ordered:  CBC and BMP  Goals of care:   Long-term care.   Kenard GowerMonina Medina-Vargas,  NP [facility] [phone] (Monday-Friday 8:00 a.m. - 5:00 p.m.) [phone] (after hours)

## 2018-06-19 ENCOUNTER — Encounter: Payer: Self-pay | Admitting: Adult Health

## 2018-06-19 ENCOUNTER — Non-Acute Institutional Stay (SKILLED_NURSING_FACILITY): Payer: Medicare Other | Admitting: Adult Health

## 2018-06-19 DIAGNOSIS — J189 Pneumonia, unspecified organism: Secondary | ICD-10-CM

## 2018-06-19 DIAGNOSIS — N179 Acute kidney failure, unspecified: Secondary | ICD-10-CM | POA: Diagnosis not present

## 2018-06-19 DIAGNOSIS — E875 Hyperkalemia: Secondary | ICD-10-CM | POA: Diagnosis not present

## 2018-06-19 NOTE — Progress Notes (Signed)
Location:  [facility] Room Number: 103-A Place of Service:  SNF (31) Provider:  Durenda Age, NP  Patient Care Team: Hendricks Limes, MD as PCP - General (Internal Medicine) Jettie Booze, MD as PCP - Cardiology (Cardiology) Medina-Vargas, Senaida Lange, NP as Nurse Practitioner (Internal Medicine)  Extended Emergency Contact Information Primary Emergency Contact: Bob-Thomas,Sherill Address: [address] [place], [place] [address] [place] Phone: [phone] Relation: Daughter  Code Status:  Full Code  Goals of care: Advanced Directive information Advanced Directives 12/19/2017  Does Patient Have a Medical Advance Directive? No  Would patient like information on creating a medical advance directive? -     Chief Complaint  Patient presents with  . Acute Visit    Patient with hyperkalemia and poor po intake    HPI:  Pt is a 78 y.o. female seen today for an acute visit secondary to poor po intake and hyperkalemia (potassium 6.0).  She is a long-term care resident of [facility].  She has a PMH of persistent atrial fibrillation, essential hypertension, chronic diastolic congestive heart failure, COPD, stage 3 renal insufficiency, dyslipidemia, dementia, seizure disorder, gout, history of CVA, and history of alcohol abuse. She has poor oral intake and was reported by charge nurse that she has seen her not even touch her dinner trays for 3 consecutive nights. Labs showed wbc 20.1, hgb 13.9, glucose 142, creatinine 1.21 BUN 62.6 Na 145 K 6.0 eGFR 65.28. No SOB noted. Chest x-ray showed bilateral perihilar fullness possibly due to perihilar atelectasis, adenopathy, developing bronchopneumonia.    Past Medical History:  Diagnosis Date  . Alcohol dependence in remission ([place])   . Arthritis   . Bradycardia   . Cataract   . Chronic anticoagulation    Xarelto  . Chronic diastolic heart  failure ([place])   . CKD (chronic kidney disease), stage III ([place])   . CVA (cerebral infarction)   . Dementia ([place])   . Edema   . Gout   . Hyperlipidemia   . Hypertension   . Murmur   . Osteoarthrosis, generalized, involving multiple sites    INVOLVING RIGHT HIP ,KNEE. AND ANKLE  . Persistent atrial fibrillation    a. Dx 04/2016, on anticoag, not felt to be a candidate for amiodarone or cardioversion.  . Seizure disorder ([place])   . Subclinical hyperthyroidism   . Vitamin D deficiency    History reviewed. No pertinent surgical history.  Allergies  Allergen Reactions  . Penicillins Hives    Outpatient Encounter Medications as of 06/19/2018  Medication Sig  . acetaminophen (TYLENOL) 325 MG tablet Take 650 mg by mouth See admin instructions. Give at 6AM, 2PM, 10PM  . acetaminophen (TYLENOL) 500 MG tablet Take 1,000 mg by mouth 2 (two) times daily as needed (Arthritic pain).   Marland Kitchen allopurinol (ZYLOPRIM) 300 MG tablet Take 300 mg by mouth daily.  . Amino Acids-Protein Hydrolys (FEEDING SUPPLEMENT, PRO-STAT SUGAR FREE 64,) LIQD Take 30 mLs by mouth 2 (two) times daily.  Marland Kitchen atorvastatin (LIPITOR) 40 MG tablet Take 40 mg by mouth daily.   . bisacodyl (DULCOLAX) 10 MG suppository Place 10 mg rectally as needed for moderate constipation.  . Cholecalciferol (VITAMIN D3) 50000 units CAPS Take 1 capsule by mouth every 30 (thirty) days.  Marland Kitchen diltiazem (CARDIZEM) 90 MG tablet Take 90 mg by mouth 2 (two) times daily.  Marland Kitchen donepezil (ARICEPT) 10 MG tablet Take  10 mg by mouth at bedtime.   . fluticasone (FLONASE) 50 MCG/ACT nasal spray Place 1 spray into both nostrils daily as needed for allergies.   . furosemide (LASIX) 80 MG tablet Take 80 mg by mouth daily.   Marland Kitchen guaifenesin (ROBITUSSIN) 100 MG/5ML syrup Take 200 mg by mouth 3 (three) times daily.  . hydroxypropyl methylcellulose / hypromellose (ISOPTO TEARS / GONIOVISC) 2.5 % ophthalmic solution Place 2 drops into both eyes 2 (two) times daily.  Marland Kitchen  ipratropium-albuterol (DUONEB) 0.5-2.5 (3) MG/3ML SOLN Take 3 mLs by nebulization 4 (four) times daily as needed (sob or wheezing).   . magnesium hydroxide (MILK OF MAGNESIA) 400 MG/5ML suspension Take by mouth daily as needed for mild constipation.  . metoprolol (TOPROL-XL) 200 MG 24 hr tablet Take 200 mg by mouth daily.   . Multiple Vitamins-Minerals (MULTIVITAMIN WITH MINERALS) tablet Take 1 tablet by mouth daily.  Marland Kitchen NUTRITIONAL SUPPLEMENT LIQD Take 120 mLs by mouth 2 (two) times daily. MedPass   . [EXPIRED] oseltamivir (TAMIFLU) 75 MG capsule Take 75 mg by mouth daily.  . potassium chloride SA (K-DUR,KLOR-CON) 20 MEQ tablet Take 40 mEq by mouth daily.   . rivaroxaban (XARELTO) 20 MG TABS tablet Take 20 mg by mouth daily with supper.   . traMADol (ULTRAM) 50 MG tablet Take 1 tablet (50 mg total) by mouth every 12 (twelve) hours as needed for moderate pain.  . [DISCONTINUED] mirtazapine (REMERON) 15 MG tablet Take 15 mg by mouth at bedtime.  . [DISCONTINUED] traZODone (DESYREL) 50 MG tablet Take 25 mg by mouth at bedtime. Give 1/2 tablet QHS to = 25 mg   No facility-administered encounter medications on file as of 06/19/2018.     Review of Systems  GENERAL: Poor appetite SKIN: +ulcer MOUTH and THROAT: Denies oral discomfort, gingival pain or bleeding RESPIRATORY: no cough, SOB, DOE, wheezing, hemoptysis CARDIAC: No chest pain, edema or palpitations GI: No abdominal pain, diarrhea, constipation, heart burn, nausea or vomiting GU: Denies dysuria, frequency, hematuria, incontinence, or discharge NEUROLOGICAL: Denies dizziness, syncope, numbness, or headache PSYCHIATRIC: Denies feelings of depression or anxiety. No report of hallucinations, insomnia, paranoia, or agitation   Immunization History  Administered Date(s) Administered  . Influenza Whole 05/15/2010  . Influenza-Unspecified 02/17/2017  . Pneumococcal Polysaccharide-23 10/25/2015  . Pneumococcal-Unspecified 05/20/2014    Pertinent  Health Maintenance Due  Topic Date Due  . PNA vac Low Risk Adult (2 of 2 - PCV13) 10/24/2016  . INFLUENZA VACCINE  12/18/2017  . DEXA SCAN  Discontinued   Fall Risk  08/28/2017  Falls in the past year? No      Vitals:   06/19/18 1334  BP: (!) 102/55  Pulse: 60  Resp: 20  Temp: 98 F (36.7 C)  TempSrc: Oral  SpO2: 97%  Weight: 159 lb (72.1 kg)  Height: 5' 7" (1.702 m)   Body mass index is 24.9 kg/m.  Physical Exam  GENERAL APPEARANCE:  Normal body habitus SKIN:  Has unstageable sacral ulcer MOUTH and THROAT: Lips are without lesions. Oral mucosa isdry RESPIRATORY: Breathing is even & unlabored, crackles on bilateral lung fields CARDIAC: Irregularly irregular, no murmur,no extra heart sounds, no edema GI: Abdomen soft, normal BS, no masses, no tenderness EXTREMITIES:  Able to move X 4 extremities, BLE with generalized weakness NEUROLOGICAL: There is no tremor. Speech is clear. Alert and oriented X 3. PSYCHIATRIC:  Affect and behavior are appropriate  Labs reviewed: Recent Labs    03/30/18 05/29/18 06/18/18  NA 144 140 145  K 3.8 4.6 6.0*  BUN 24* 36* 63*  CREATININE 0.9 1.1 1.2*   Recent Labs    09/19/17  AST 14  ALT 8  ALKPHOS 119   Recent Labs    05/29/18 06/02/18 06/18/18  WBC 15.4 16.0 20.1  NEUTROABS _0 HGB 14.5 13.5 13.9  HCT 44 41 43  PLT 187 192 192   Lab Results  Component Value Date   TSH 1.110 12/24/2016   Lab Results  Component Value Date   HGBA1C 6.2 07/25/2017   Lab Results  Component Value Date   CHOL 151 09/19/2017   HDL 41 09/19/2017   LDLCALC 89 09/19/2017   TRIG 106 09/19/2017   CHOLHDL 3.9 Ratio 05/15/2010    Assessment/Plan  1. Facility-acquired pneumonia - chest x-ray showed possibly developing bronchopneumonia and wbc 20.1, will start on Doxycycline 100 mg BID X 10 days and Florastor 250 mg BID X 13 days  2. Hyperkalemia - K= 6.0, elevated, will give Kayexalate 15 gm/60 ml PO @ 1:30 pm and  4PM, repeat BMP on 06/20/18  3. AKI (acute kidney injury) ([place]) -  Creatinine 1.21 and BUN 62.6, has poor oral intake, will start on D5 1/2 NS @ 70 ml via peripheral IV X 1L    Family/ staff Communication:  Discussed plan of care with resident.  Labs/tests ordered:  BMP on 06/20/18  Goals of care:   Long-term care.   Durenda Age, NP [facility] [phone] (Monday-Friday 8:00 a.m. - 5:00 p.m.) [phone] (after hours)

## 2018-06-23 ENCOUNTER — Non-Acute Institutional Stay (SKILLED_NURSING_FACILITY): Payer: Medicare Other | Admitting: Internal Medicine

## 2018-06-23 ENCOUNTER — Encounter: Payer: Self-pay | Admitting: Internal Medicine

## 2018-06-23 DIAGNOSIS — M1A9XX0 Chronic gout, unspecified, without tophus (tophi): Secondary | ICD-10-CM | POA: Diagnosis not present

## 2018-06-23 DIAGNOSIS — D72825 Bandemia: Secondary | ICD-10-CM

## 2018-06-23 DIAGNOSIS — N182 Chronic kidney disease, stage 2 (mild): Secondary | ICD-10-CM

## 2018-06-23 DIAGNOSIS — E875 Hyperkalemia: Secondary | ICD-10-CM | POA: Diagnosis not present

## 2018-06-23 NOTE — Assessment & Plan Note (Signed)
Hold KCL until BMET back

## 2018-06-23 NOTE — Assessment & Plan Note (Signed)
Recheck uric acid to see if allopurinol dose can be decreased

## 2018-06-23 NOTE — Progress Notes (Signed)
NURSING HOME LOCATION:  [facility] ROOM NUMBER:  103-A  CODE STATUS:  Full Code  PCP:  Pecola LawlessHopper, Arial Galligan F, MD  [address] [place] [address]  This is a nursing facility follow up for specific acute issue of leukocytosis and hyperkalemia.  Interim medical record and care since last [facility] visit was updated with review of diagnostic studies and change in clinical status since last visit were documented.  HPI: The patient white count has been persistently elevated with white counts ranging from 15,400 up to a value of 20,100 on 06/18/2018.  On 03/30/2018 white count was normal at 7800.  The present elevated white count is associated with a left shift with 83.3% neutrophils.  Additionally she was noted to have a potassium of 6 on 06/18/2018.  Repeat was requested for 2/1 but has not been completed to date.  Creatinine is 1.21 and BUN 62.6.  She received a liter of D5 W because of the prerenal azotemia superimposed on CKD.  Hyperosmolarity was present with a value of 308.9.  GFR was 65.28.  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 The patient is on 40 mEq of potassium in the context of diuresis with 80 mg of Lasix. Mobile imaging revealed right lower lobe tissue density with a sharp edge mitigating against pneumonia or effusion. She has large pendulous breasts. Rotation hindered adequate assessment.  Review of systems: Dementia invalidated responses. Date given as Saturday, November, ????.  She denies any active symptoms particularly cardiopulmonary symptoms. She has knee pain when moved in bed. She states "feeling pretty good". Staff reports poor appetite unless grandchildren visit. Family brings in fast foods such as french fries.Wound Care Nurse reports unstagable sacral wound.  Constitutional: No fever, significant weight change, fatigue  ENT/mouth: No nasal congestion,  purulent discharge, earache, change in hearing, sore throat  Cardiovascular: No chest pain,  palpitations, paroxysmal nocturnal dyspnea, claudication, edema  Respiratory: No cough, sputum production, hemoptysis, DOE, significant snoring, apnea   Gastrointestinal: No heartburn, dysphagia, abdominal pain, nausea /vomiting, rectal bleeding, melena, change in bowels Genitourinary: No dysuria, hematuria, pyuria, incontinence, nocturia Dermatologic: No rash, pruritus Neurologic: No dizziness, headache, syncope, seizures, numbness, tingling Psychiatric: No significant anxiety, depression, insomnia Endocrine: No change in hair/skin/nails, excessive thirst, excessive hunger, excessive urination  Hematologic/lymphatic: No significant bruising, lymphadenopathy, abnormal bleeding Allergy/immunology: No itchy/watery eyes, significant sneezing, urticaria, angioedema  Physical exam:  Pertinent or positive findings: She is animated and answers queries by opening her eyes wide and emphatically saying "no".  She has extremely poor dentition with only a few mandibular teeth remaining with decay and plaque.  She has diffuse low-grade rhonchi.  Heart is slow and irregular despite recorded P of 115 in VS.  Pedal pulses are decreased.  She has fusiform changes in knees.  There is atrophy of the gastrocnemii muscles.  Her legs are extremely weak to opposition.  She is wearing booties.  General appearance: Adequately nourished; no acute distress, increased work of breathing is present.   Lymphatic: No lymphadenopathy about the head, neck, axilla. Eyes: No conjunctival inflammation or lid edema is present. There is no scleral icterus. Ears:  External ear exam shows no significant lesions or deformities.   Nose:  External nasal examination shows no deformity or inflammation. Nasal mucosa are pink and moist without lesions, exudates Oral exam:  Lips and gums are healthy appearing. There is no oropharyngeal erythema or exudate. Neck:  No thyromegaly, masses, tenderness noted.    Heart:  No  gallop, murmur, click,  rub .  Abdomen: Bowel sounds are normal. Abdomen is soft and nontender with no organomegaly, hernias, masses. GU: Deferred  Extremities:  No cyanosis, clubbing, edema  Neurologic exam : Balance, Rhomberg, finger to nose testing could not be completed due to clinical state Skin: Warm & dry w/o tenting. Sacral wound care deferred to Wound Care Nurse No significant lesions or rash.  See summary under each active problem in the Problem List with associated updated therapeutic plan

## 2018-06-24 ENCOUNTER — Non-Acute Institutional Stay (SKILLED_NURSING_FACILITY): Payer: Medicare Other | Admitting: Adult Health

## 2018-06-24 ENCOUNTER — Encounter: Payer: Self-pay | Admitting: Adult Health

## 2018-06-24 ENCOUNTER — Encounter: Payer: Self-pay | Admitting: Internal Medicine

## 2018-06-24 DIAGNOSIS — R52 Pain, unspecified: Secondary | ICD-10-CM

## 2018-06-24 DIAGNOSIS — L89150 Pressure ulcer of sacral region, unstageable: Secondary | ICD-10-CM

## 2018-06-24 DIAGNOSIS — R05 Cough: Secondary | ICD-10-CM | POA: Diagnosis not present

## 2018-06-24 DIAGNOSIS — Z7189 Other specified counseling: Secondary | ICD-10-CM | POA: Diagnosis not present

## 2018-06-24 DIAGNOSIS — F39 Unspecified mood [affective] disorder: Secondary | ICD-10-CM

## 2018-06-24 DIAGNOSIS — R059 Cough, unspecified: Secondary | ICD-10-CM

## 2018-06-24 DIAGNOSIS — R630 Anorexia: Secondary | ICD-10-CM

## 2018-06-24 LAB — BASIC METABOLIC PANEL
BUN: 40 — AB (ref 4–21)
BUN: 40 — AB (ref 4–21)
CREATININE: 0.8 (ref 0.5–1.1)
Creatinine: 0.8 (ref 0.5–1.1)
GLUCOSE: 97
Glucose: 212
POTASSIUM: 4.4 (ref 3.4–5.3)
Potassium: 3.9 (ref 3.4–5.3)
Sodium: 142 (ref 137–147)
Sodium: 145 (ref 137–147)

## 2018-06-24 LAB — CBC AND DIFFERENTIAL
HCT: 40 (ref 36–46)
Hemoglobin: 12.8 (ref 12.0–16.0)
Neutrophils Absolute: 22
Platelets: 180 (ref 150–399)
WBC: 24.2

## 2018-06-24 NOTE — Progress Notes (Addendum)
Location:  [facility] Room Number: 103-A Place of Service:  SNF (31) Provider:  Kenard GowerMedina-Vargas, Velmer Woelfel, NP  Patient Care Team: Pecola LawlessHopper, William F, MD as PCP - General (Internal Medicine) Corky CraftsVaranasi, Jayadeep S, MD as PCP - Cardiology (Cardiology) Medina-Vargas, Margit BandaMonina C, NP as Nurse Practitioner (Internal Medicine)  Extended Emergency Contact Information Primary Emergency Contact: Hargan-Thomas,Sherill Address: [address]          [place], [place] [address] [place] Home Phone: [phone] Relation: Daughter  Code Status:  Full Code  Goals of care: Advanced Directive information Advanced Directives 12/19/2017  Does Patient Have a Medical Advance Directive? No  Would patient like information on creating a medical advance directive? -     Chief Complaint  Patient presents with  . Medical Management of Chronic Issues    Routine [facility] visit  . Advanced Directive    Care Plan Meeting    HPI:  Pt is a 78 y.o. female seen today for medical management of chronic diseases, as well as a care plan meeting.  She is a long-[facility] care [facility].  She has a PMH of persistent atrial fibrillation, essential hypertension, chronic diastolic CHF, COPD, stage 3 renal insufficiency, dyslipidemia, dementia, seizure disorder, gout, history of CVA, and history of alcohol abuse. She was seen in the room today. Limited assessment was done due to refusal to move. She denies any pain but does not want to move since movement might cause her pain. Care plan meeting was attended by Child psychotherapistocial Worker, NP, Wound Nurse and Activity Director. The daughter, Ms. Maisie Fushomas, attended via conference call. She requested for her mother to get out of bed daily. She requested for additional supplementations due to poor appetite. She had weight loss of 11 lbs in 3 months. She said that she is aware that her mother is not a social person and would not  even get out of bed for her. Daughter verbalized that she has not gotten any copy of her mother's care plans from the facility. Daughter's address was updated. A copy of the care plan was requested to be placed underneath the TV instead of mailing it to her. She wanted all staff members that were present during the care plan meeting. The meeting lasted for at least 20 minutes.   Past Medical History:  Diagnosis Date  . Alcohol dependence in remission (HCC)   . Arthritis   . Bradycardia   . Cataract   . Chronic anticoagulation    Xarelto  . Chronic diastolic heart failure (HCC)   . CKD (chronic kidney disease), stage III (HCC)   . CVA (cerebral infarction)   . Dementia (HCC)   . Edema   . Gout   . Hyperlipidemia   . Hypertension   . Murmur   . Osteoarthrosis, generalized, involving multiple sites    INVOLVING RIGHT HIP ,KNEE. AND ANKLE  . Persistent atrial fibrillation    a. Dx 04/2016, on anticoag, not felt to be a candidate for amiodarone or cardioversion.  . Seizure disorder (HCC)   . Subclinical hyperthyroidism   . Vitamin D deficiency    History reviewed. No pertinent surgical history.  Allergies  Allergen Reactions  . Penicillins Hives    Outpatient Encounter Medications as of 06/24/2018  Medication Sig  . acetaminophen (TYLENOL) 500 MG tablet Take 500 mg by mouth See admin instructions. Give BID prn for pain or fever, give q8h scheduled for pain.  Marland Kitchen. allopurinol (  ZYLOPRIM) 300 MG tablet Take 300 mg by mouth daily.  . Amino Acids-Protein Hydrolys (FEEDING SUPPLEMENT, PRO-STAT SUGAR FREE 64,) LIQD Take 30 mLs by mouth 2 (two) times daily.  Marland Kitchen atorvastatin (LIPITOR) 40 MG tablet Take 40 mg by mouth daily.   . bisacodyl (DULCOLAX) 10 MG suppository Place 10 mg rectally as needed for moderate constipation.  . Cholecalciferol (VITAMIN D3) 50000 units CAPS Take 1 capsule by mouth every 30 (thirty) days.  . collagenase (SANTYL) ointment Apply 1 application topically daily.  Marland Kitchen  diltiazem (CARDIZEM) 90 MG tablet Take 90 mg by mouth 2 (two) times daily.  Marland Kitchen donepezil (ARICEPT) 10 MG tablet Take 10 mg by mouth at bedtime.   Marland Kitchen doxycycline (VIBRA-TABS) 100 MG tablet Take 100 mg by mouth 2 (two) times daily.  . fluticasone (FLONASE) 50 MCG/ACT nasal spray Place 1 spray into both nostrils daily as needed for allergies.   . furosemide (LASIX) 80 MG tablet Take 80 mg by mouth daily.   Marland Kitchen guaifenesin (ROBITUSSIN) 100 MG/5ML syrup Take 200 mg by mouth 3 (three) times daily.  . hydroxypropyl methylcellulose / hypromellose (ISOPTO TEARS / GONIOVISC) 2.5 % ophthalmic solution Place 2 drops into both eyes 2 (two) times daily.  Marland Kitchen ipratropium-albuterol (DUONEB) 0.5-2.5 (3) MG/3ML SOLN Take 3 mLs by nebulization 4 (four) times daily as needed (sob or wheezing).   . magnesium hydroxide (MILK OF MAGNESIA) 400 MG/5ML suspension Take by mouth daily as needed for mild constipation.  . Menthol, Topical Analgesic, (BIOFREEZE COLORLESS) 4 % GEL Apply 1 application topically 3 (three) times daily as needed (Pain). Apply to knees  . metoprolol (TOPROL-XL) 200 MG 24 hr tablet Take 200 mg by mouth daily.   . Multiple Vitamins-Minerals (MULTIVITAMIN WITH MINERALS) tablet Take 1 tablet by mouth daily.  Marland Kitchen NUTRITIONAL SUPPLEMENT LIQD Take 120 mLs by mouth 2 (two) times daily. MedPass   . rivaroxaban (XARELTO) 20 MG TABS tablet Take 20 mg by mouth daily with supper.   . saccharomyces boulardii (FLORASTOR) 250 MG capsule Take 250 mg by mouth 2 (two) times daily.  . traMADol (ULTRAM) 50 MG tablet Take 1 tablet (50 mg total) by mouth every 12 (twelve) hours as needed for moderate pain.  . [DISCONTINUED] acetaminophen (TYLENOL) 325 MG tablet Take 650 mg by mouth See admin instructions. Give at 6AM, 2PM, 10PM  . [DISCONTINUED] potassium chloride SA (K-DUR,KLOR-CON) 20 MEQ tablet Take 40 mEq by mouth daily.    No facility-administered encounter medications on file as of 06/24/2018.     Review of  Systems  GENERAL: +poor appetite and weight loss SKIN: +bedsore MOUTH and THROAT: Denies oral discomfort RESPIRATORY: +cough CARDIAC: No chest pain, edema or palpitations GI: No abdominal pain, diarrhea, constipation, heart burn, nausea or vomiting NEUROLOGICAL: Denies dizziness, syncope, numbness, or headache PSYCHIATRIC: +agitation    Immunization History  Administered Date(s) Administered  . Influenza Whole 05/15/2010  . Influenza-Unspecified 02/17/2017  . Pneumococcal Conjugate-13 05/20/2014  . Pneumococcal Polysaccharide-23 10/25/2015   Pertinent  Health Maintenance Due  Topic Date Due  . INFLUENZA VACCINE  12/18/2017  . PNA vac Low Risk Adult  Completed  . DEXA SCAN  Discontinued   Fall Risk  08/28/2017  Falls in the past year? No    Vitals:   06/24/18 1005 06/24/18 1007  BP: 119/74   Pulse: (!) 114 (!) 115  Resp: 16   Temp: (!) 97.5 F (36.4 C)   TempSrc: Oral   SpO2: 94%   Weight: 159 lb (72.1 kg)  Height: 5\' 7"  (1.702 m)    Body mass index is 24.9 kg/m.  Physical Exam  GENERAL APPEARANCE:  In no acute distress. Normal body habitus SKIN:  Unstageable sacral ulcer MOUTH and THROAT: Lips are without lesions. RESPIRATORY: Breathing is even & unlabored, noted crackles anteriorly CARDIAC: RRR, no murmur,no extra heart sounds, no edema GI: Abdomen soft, normal BS, no masses, no tenderness NEUROLOGICAL: There is no tremor. Speech is clear. Alert to self, disoriented to time and place PSYCHIATRIC: . Agitated  Labs reviewed: Recent Labs    03/30/18 05/29/18 06/18/18  NA 144 140 145  K 3.8 4.6 6.0*  BUN 24* 36* 63*  CREATININE 0.9 1.1 1.2*   Recent Labs    09/19/17  AST 14  ALT 8  ALKPHOS 119   Recent Labs    05/29/18 06/02/18 06/18/18  WBC 15.4 16.0 20.1  NEUTROABS 12 11 17   HGB 14.5 13.5 13.9  HCT 44 41 43  PLT 187 192 192   Lab Results  Component Value Date   TSH 1.110 12/24/2016   Lab Results  Component Value Date   HGBA1C 6.2  07/25/2017   Lab Results  Component Value Date   CHOL 151 09/19/2017   HDL 41 09/19/2017   LDLCALC 89 09/19/2017   TRIG 106 09/19/2017   CHOLHDL 3.9 Ratio 05/15/2010    Assessment/Plan  1. Cough - continues to have productive cough, will continue Robitussin 100 mg/5 ml give 10 ml Q 6am, 2pm and 10pm X 1 more week  2. Generalized pain -  Refuses to move due to pain, will start Tramadol 50 mg 1 tab Q AM X 2 weeks and Q 12 hours PRN, continue Acetaminophen 500 mg TID, PT/OT evaluation for deconditioning  3. Decubitus ulcer of sacral region, unstageable (HCC) - continue daily treatment with Santyl, air mattress over bed, prostat 30 ml BID, will start magic cup TID, increase medpass 120 ml from BID to TID  4. Poor appetite - will start Eldertonic 15 ml BID and increase supplementations  5. Mood disorder (HCC) - agitated with staff, refusing care, will start Depakote 125 mg BID and refer to psych NP  6. Advanced care planning/counseling discussion  - discussed plan of care and current medications with IDT   06/25/18 Addendum: Patient will be transferred to the hospital for further evaluation. Daughter requested for patient to be sent to the hospital for further evaluation and management.    Family/ staff Communication:  Discussed plan of care with IDT.  Labs/tests ordered:  CBC and BMP  Goals of care:   Long-term care.   Kenard Gower, NP [facility] [phone] (Monday-Friday 8:00 a.m. - 5:00 p.m.) [phone] (after hours)

## 2018-06-24 NOTE — Progress Notes (Signed)
Uric acid 3.5

## 2018-06-24 NOTE — Patient Instructions (Signed)
See assessment and plan under each diagnosis in the problem list and acutely for this visit 

## 2018-06-25 ENCOUNTER — Other Ambulatory Visit: Payer: Self-pay

## 2018-06-25 ENCOUNTER — Encounter (HOSPITAL_COMMUNITY): Payer: Self-pay | Admitting: Emergency Medicine

## 2018-06-25 ENCOUNTER — Inpatient Hospital Stay (HOSPITAL_COMMUNITY): Payer: Medicare Other

## 2018-06-25 ENCOUNTER — Emergency Department (HOSPITAL_COMMUNITY): Payer: Medicare Other

## 2018-06-25 ENCOUNTER — Inpatient Hospital Stay (HOSPITAL_COMMUNITY)
Admission: EM | Admit: 2018-06-25 | Discharge: 2018-07-19 | DRG: 871 | Disposition: E | Payer: Medicare Other | Source: Skilled Nursing Facility | Attending: Pulmonary Disease | Admitting: Pulmonary Disease

## 2018-06-25 DIAGNOSIS — I469 Cardiac arrest, cause unspecified: Secondary | ICD-10-CM | POA: Diagnosis not present

## 2018-06-25 DIAGNOSIS — I10 Essential (primary) hypertension: Secondary | ICD-10-CM | POA: Diagnosis present

## 2018-06-25 DIAGNOSIS — F0390 Unspecified dementia without behavioral disturbance: Secondary | ICD-10-CM | POA: Diagnosis present

## 2018-06-25 DIAGNOSIS — I130 Hypertensive heart and chronic kidney disease with heart failure and stage 1 through stage 4 chronic kidney disease, or unspecified chronic kidney disease: Secondary | ICD-10-CM | POA: Diagnosis present

## 2018-06-25 DIAGNOSIS — Z515 Encounter for palliative care: Secondary | ICD-10-CM | POA: Diagnosis not present

## 2018-06-25 DIAGNOSIS — E559 Vitamin D deficiency, unspecified: Secondary | ICD-10-CM | POA: Diagnosis present

## 2018-06-25 DIAGNOSIS — L89154 Pressure ulcer of sacral region, stage 4: Secondary | ICD-10-CM | POA: Diagnosis present

## 2018-06-25 DIAGNOSIS — I4819 Other persistent atrial fibrillation: Secondary | ICD-10-CM | POA: Diagnosis present

## 2018-06-25 DIAGNOSIS — R578 Other shock: Secondary | ICD-10-CM | POA: Diagnosis not present

## 2018-06-25 DIAGNOSIS — N189 Chronic kidney disease, unspecified: Secondary | ICD-10-CM | POA: Diagnosis present

## 2018-06-25 DIAGNOSIS — M109 Gout, unspecified: Secondary | ICD-10-CM | POA: Diagnosis present

## 2018-06-25 DIAGNOSIS — L89150 Pressure ulcer of sacral region, unstageable: Secondary | ICD-10-CM

## 2018-06-25 DIAGNOSIS — K921 Melena: Secondary | ICD-10-CM | POA: Diagnosis not present

## 2018-06-25 DIAGNOSIS — M869 Osteomyelitis, unspecified: Secondary | ICD-10-CM | POA: Diagnosis present

## 2018-06-25 DIAGNOSIS — D6832 Hemorrhagic disorder due to extrinsic circulating anticoagulants: Secondary | ICD-10-CM | POA: Diagnosis not present

## 2018-06-25 DIAGNOSIS — I96 Gangrene, not elsewhere classified: Secondary | ICD-10-CM | POA: Diagnosis present

## 2018-06-25 DIAGNOSIS — E7800 Pure hypercholesterolemia, unspecified: Secondary | ICD-10-CM | POA: Diagnosis present

## 2018-06-25 DIAGNOSIS — R0602 Shortness of breath: Secondary | ICD-10-CM

## 2018-06-25 DIAGNOSIS — Z8249 Family history of ischemic heart disease and other diseases of the circulatory system: Secondary | ICD-10-CM

## 2018-06-25 DIAGNOSIS — Z8673 Personal history of transient ischemic attack (TIA), and cerebral infarction without residual deficits: Secondary | ICD-10-CM

## 2018-06-25 DIAGNOSIS — D62 Acute posthemorrhagic anemia: Secondary | ICD-10-CM | POA: Diagnosis not present

## 2018-06-25 DIAGNOSIS — Z6824 Body mass index (BMI) 24.0-24.9, adult: Secondary | ICD-10-CM

## 2018-06-25 DIAGNOSIS — Z87891 Personal history of nicotine dependence: Secondary | ICD-10-CM | POA: Diagnosis not present

## 2018-06-25 DIAGNOSIS — L089 Local infection of the skin and subcutaneous tissue, unspecified: Secondary | ICD-10-CM | POA: Diagnosis present

## 2018-06-25 DIAGNOSIS — N179 Acute kidney failure, unspecified: Secondary | ICD-10-CM | POA: Diagnosis present

## 2018-06-25 DIAGNOSIS — Z833 Family history of diabetes mellitus: Secondary | ICD-10-CM

## 2018-06-25 DIAGNOSIS — Z66 Do not resuscitate: Secondary | ICD-10-CM | POA: Diagnosis not present

## 2018-06-25 DIAGNOSIS — L8990 Pressure ulcer of unspecified site, unspecified stage: Secondary | ICD-10-CM

## 2018-06-25 DIAGNOSIS — Z880 Allergy status to penicillin: Secondary | ICD-10-CM | POA: Diagnosis not present

## 2018-06-25 DIAGNOSIS — J449 Chronic obstructive pulmonary disease, unspecified: Secondary | ICD-10-CM | POA: Diagnosis present

## 2018-06-25 DIAGNOSIS — Z825 Family history of asthma and other chronic lower respiratory diseases: Secondary | ICD-10-CM

## 2018-06-25 DIAGNOSIS — Z7901 Long term (current) use of anticoagulants: Secondary | ICD-10-CM

## 2018-06-25 DIAGNOSIS — Z4659 Encounter for fitting and adjustment of other gastrointestinal appliance and device: Secondary | ICD-10-CM

## 2018-06-25 DIAGNOSIS — N183 Chronic kidney disease, stage 3 (moderate): Secondary | ICD-10-CM | POA: Diagnosis present

## 2018-06-25 DIAGNOSIS — R5381 Other malaise: Secondary | ICD-10-CM | POA: Diagnosis present

## 2018-06-25 DIAGNOSIS — Z978 Presence of other specified devices: Secondary | ICD-10-CM

## 2018-06-25 DIAGNOSIS — A419 Sepsis, unspecified organism: Principal | ICD-10-CM | POA: Diagnosis present

## 2018-06-25 DIAGNOSIS — E43 Unspecified severe protein-calorie malnutrition: Secondary | ICD-10-CM

## 2018-06-25 DIAGNOSIS — I5032 Chronic diastolic (congestive) heart failure: Secondary | ICD-10-CM | POA: Diagnosis present

## 2018-06-25 DIAGNOSIS — G40909 Epilepsy, unspecified, not intractable, without status epilepticus: Secondary | ICD-10-CM | POA: Diagnosis present

## 2018-06-25 DIAGNOSIS — R627 Adult failure to thrive: Secondary | ICD-10-CM | POA: Diagnosis present

## 2018-06-25 DIAGNOSIS — Z993 Dependence on wheelchair: Secondary | ICD-10-CM

## 2018-06-25 DIAGNOSIS — E785 Hyperlipidemia, unspecified: Secondary | ICD-10-CM | POA: Diagnosis present

## 2018-06-25 DIAGNOSIS — T45515A Adverse effect of anticoagulants, initial encounter: Secondary | ICD-10-CM | POA: Diagnosis not present

## 2018-06-25 LAB — CBC WITH DIFFERENTIAL/PLATELET
Abs Immature Granulocytes: 0 10*3/uL (ref 0.00–0.07)
BASOS ABS: 0 10*3/uL (ref 0.0–0.1)
Basophils Relative: 0 %
EOS ABS: 0 10*3/uL (ref 0.0–0.5)
EOS PCT: 0 %
HEMATOCRIT: 42 % (ref 36.0–46.0)
HEMOGLOBIN: 13.4 g/dL (ref 12.0–15.0)
LYMPHS ABS: 0.8 10*3/uL (ref 0.7–4.0)
Lymphocytes Relative: 3 %
MCH: 29.9 pg (ref 26.0–34.0)
MCHC: 31.9 g/dL (ref 30.0–36.0)
MCV: 93.8 fL (ref 80.0–100.0)
Monocytes Absolute: 0.8 10*3/uL (ref 0.1–1.0)
Monocytes Relative: 3 %
Neutro Abs: 26.1 10*3/uL — ABNORMAL HIGH (ref 1.7–7.7)
Neutrophils Relative %: 94 %
Platelets: 211 10*3/uL (ref 150–400)
RBC: 4.48 MIL/uL (ref 3.87–5.11)
RDW: 18.6 % — AB (ref 11.5–15.5)
WBC: 27.8 10*3/uL — AB (ref 4.0–10.5)
nRBC: 0 /100 WBC
nRBC: 0.2 % (ref 0.0–0.2)

## 2018-06-25 LAB — LACTIC ACID, PLASMA: Lactic Acid, Venous: 2.9 mmol/L (ref 0.5–1.9)

## 2018-06-25 LAB — POCT I-STAT EG7
Acid-base deficit: 2 mmol/L (ref 0.0–2.0)
Bicarbonate: 22.3 mmol/L (ref 20.0–28.0)
Calcium, Ion: 1.15 mmol/L (ref 1.15–1.40)
HCT: 41 % (ref 36.0–46.0)
Hemoglobin: 13.9 g/dL (ref 12.0–15.0)
O2 Saturation: 75 %
Potassium: 4.4 mmol/L (ref 3.5–5.1)
Sodium: 141 mmol/L (ref 135–145)
TCO2: 23 mmol/L (ref 22–32)
pCO2, Ven: 34.7 mmHg — ABNORMAL LOW (ref 44.0–60.0)
pH, Ven: 7.415 (ref 7.250–7.430)
pO2, Ven: 39 mmHg (ref 32.0–45.0)

## 2018-06-25 LAB — BASIC METABOLIC PANEL
Anion gap: 14 (ref 5–15)
BUN: 40 mg/dL — ABNORMAL HIGH (ref 8–23)
CO2: 19 mmol/L — ABNORMAL LOW (ref 22–32)
Calcium: 9.2 mg/dL (ref 8.9–10.3)
Chloride: 108 mmol/L (ref 98–111)
Creatinine, Ser: 0.95 mg/dL (ref 0.44–1.00)
GFR calc Af Amer: >60 mL/min (ref >60)
GFR, EST NON AFRICAN AMERICAN: 57 mL/min — AB (ref >60)
Glucose, Bld: 131 mg/dL — ABNORMAL HIGH (ref 70–99)
Potassium: 5.1 mmol/L (ref 3.5–5.1)
Sodium: 141 mmol/L (ref 135–145)

## 2018-06-25 LAB — HEPATIC FUNCTION PANEL
ALT: 52 U/L — ABNORMAL HIGH (ref 0–44)
AST: 53 U/L — ABNORMAL HIGH (ref 15–41)
Albumin: 2 g/dL — ABNORMAL LOW (ref 3.5–5.0)
Alkaline Phosphatase: 123 U/L (ref 38–126)
Bilirubin, Direct: 0.4 mg/dL — ABNORMAL HIGH (ref 0.0–0.2)
Indirect Bilirubin: 0.8 mg/dL (ref 0.3–0.9)
Total Bilirubin: 1.2 mg/dL (ref 0.3–1.2)
Total Protein: 7.4 g/dL (ref 6.5–8.1)

## 2018-06-25 LAB — TSH: TSH: 1.399 u[IU]/mL (ref 0.350–4.500)

## 2018-06-25 LAB — I-STAT CREATININE, ED: Creatinine, Ser: 0.8 mg/dL (ref 0.44–1.00)

## 2018-06-25 MED ORDER — FENTANYL CITRATE (PF) 100 MCG/2ML IJ SOLN
50.0000 ug | Freq: Once | INTRAMUSCULAR | Status: AC
  Administered 2018-06-25: 50 ug via INTRAVENOUS

## 2018-06-25 MED ORDER — FLUTICASONE PROPIONATE 50 MCG/ACT NA SUSP
1.0000 | Freq: Every day | NASAL | Status: DC | PRN
  Filled 2018-06-25: qty 16

## 2018-06-25 MED ORDER — POLYVINYL ALCOHOL 1.4 % OP SOLN
2.0000 [drp] | Freq: Two times a day (BID) | OPHTHALMIC | Status: DC
  Administered 2018-06-26 – 2018-06-30 (×9): 2 [drp] via OPHTHALMIC
  Filled 2018-06-25 (×2): qty 15

## 2018-06-25 MED ORDER — SODIUM CHLORIDE 0.9 % IV SOLN
2.0000 g | INTRAVENOUS | Status: DC
  Administered 2018-06-25 – 2018-07-01 (×6): 2 g via INTRAVENOUS
  Filled 2018-06-25 (×6): qty 20

## 2018-06-25 MED ORDER — HEPARIN (PORCINE) 25000 UT/250ML-% IV SOLN
1400.0000 [IU]/h | INTRAVENOUS | Status: DC
  Administered 2018-06-25 – 2018-06-26 (×2): 1000 [IU]/h via INTRAVENOUS
  Administered 2018-06-28: 900 [IU]/h via INTRAVENOUS
  Administered 2018-06-29: 1250 [IU]/h via INTRAVENOUS
  Administered 2018-06-30: 1400 [IU]/h via INTRAVENOUS
  Filled 2018-06-25 (×7): qty 250

## 2018-06-25 MED ORDER — COLLAGENASE 250 UNIT/GM EX OINT
TOPICAL_OINTMENT | Freq: Two times a day (BID) | CUTANEOUS | Status: DC
  Administered 2018-06-25: 22:00:00 via TOPICAL
  Administered 2018-06-26: 1 via TOPICAL
  Administered 2018-06-26: via TOPICAL
  Administered 2018-06-27: 1 via TOPICAL
  Administered 2018-06-27: 10:00:00 via TOPICAL
  Administered 2018-06-28: 1 via TOPICAL
  Administered 2018-06-28 – 2018-07-02 (×8): via TOPICAL
  Filled 2018-06-25 (×3): qty 30

## 2018-06-25 MED ORDER — DONEPEZIL HCL 10 MG PO TABS
10.0000 mg | ORAL_TABLET | Freq: Every day | ORAL | Status: DC
  Administered 2018-06-25 – 2018-07-01 (×7): 10 mg via ORAL
  Filled 2018-06-25 (×7): qty 1

## 2018-06-25 MED ORDER — VITAMIN D3 1.25 MG (50000 UT) PO CAPS: 1.0000 | ORAL_CAPSULE | ORAL | Status: DC

## 2018-06-25 MED ORDER — SODIUM CHLORIDE 0.9 % IV BOLUS
1000.0000 mL | Freq: Once | INTRAVENOUS | Status: AC
  Administered 2018-06-25: 1000 mL via INTRAVENOUS

## 2018-06-25 MED ORDER — ADULT MULTIVITAMIN W/MINERALS CH
1.0000 | ORAL_TABLET | Freq: Every day | ORAL | Status: DC
  Administered 2018-06-26 – 2018-06-30 (×5): 1 via ORAL
  Filled 2018-06-25 (×6): qty 1

## 2018-06-25 MED ORDER — ALLOPURINOL 300 MG PO TABS
300.0000 mg | ORAL_TABLET | Freq: Every day | ORAL | Status: DC
  Administered 2018-06-26 – 2018-06-30 (×5): 300 mg via ORAL
  Filled 2018-06-25 (×5): qty 1

## 2018-06-25 MED ORDER — ATORVASTATIN CALCIUM 40 MG PO TABS
40.0000 mg | ORAL_TABLET | Freq: Every day | ORAL | Status: DC
  Administered 2018-06-25 – 2018-06-30 (×6): 40 mg via ORAL
  Filled 2018-06-25 (×6): qty 1

## 2018-06-25 MED ORDER — SODIUM CHLORIDE 0.9 % IV SOLN
INTRAVENOUS | Status: DC
  Administered 2018-06-25 – 2018-06-28 (×2): via INTRAVENOUS

## 2018-06-25 MED ORDER — PRO-STAT SUGAR FREE PO LIQD
30.0000 mL | Freq: Two times a day (BID) | ORAL | Status: DC
  Administered 2018-06-26 – 2018-07-01 (×11): 30 mL via ORAL
  Filled 2018-06-25 (×12): qty 30

## 2018-06-25 MED ORDER — VANCOMYCIN HCL 10 G IV SOLR
1500.0000 mg | Freq: Once | INTRAVENOUS | Status: DC
  Filled 2018-06-25 (×2): qty 1500

## 2018-06-25 MED ORDER — ONDANSETRON HCL 4 MG/2ML IJ SOLN: 4.0000 mg | Freq: Four times a day (QID) | INTRAMUSCULAR | Status: DC | PRN

## 2018-06-25 MED ORDER — VANCOMYCIN HCL IN DEXTROSE 1-5 GM/200ML-% IV SOLN
1000.0000 mg | Freq: Once | INTRAVENOUS | Status: DC
  Filled 2018-06-25: qty 200

## 2018-06-25 MED ORDER — SODIUM CHLORIDE 0.9 % IV SOLN
2.0000 g | Freq: Once | INTRAVENOUS | Status: AC
  Administered 2018-06-25: 2 g via INTRAVENOUS
  Filled 2018-06-25: qty 2

## 2018-06-25 MED ORDER — BISACODYL 5 MG PO TBEC: 5.0000 mg | DELAYED_RELEASE_TABLET | Freq: Every day | ORAL | Status: DC | PRN

## 2018-06-25 MED ORDER — GUAIFENESIN 100 MG/5ML PO SOLN
10.0000 mL | Freq: Three times a day (TID) | ORAL | Status: DC
  Administered 2018-06-25 – 2018-06-26 (×3): 200 mg via ORAL
  Filled 2018-06-25: qty 25
  Filled 2018-06-25 (×4): qty 5

## 2018-06-25 MED ORDER — ALBUTEROL SULFATE (2.5 MG/3ML) 0.083% IN NEBU: 2.5000 mg | INHALATION_SOLUTION | RESPIRATORY_TRACT | Status: DC | PRN

## 2018-06-25 MED ORDER — POLYETHYLENE GLYCOL 3350 17 G PO PACK
17.0000 g | PACK | Freq: Every day | ORAL | Status: DC
  Administered 2018-06-26 – 2018-06-30 (×4): 17 g via ORAL
  Filled 2018-06-25 (×5): qty 1

## 2018-06-25 MED ORDER — METOPROLOL SUCCINATE ER 100 MG PO TB24
200.0000 mg | ORAL_TABLET | Freq: Every day | ORAL | Status: DC
  Administered 2018-06-26 – 2018-06-30 (×4): 200 mg via ORAL
  Filled 2018-06-25 (×5): qty 2

## 2018-06-25 MED ORDER — NUTRITIONAL SUPPLEMENT PO LIQD: 120.0000 mL | Freq: Two times a day (BID) | ORAL | Status: DC

## 2018-06-25 MED ORDER — VANCOMYCIN HCL 10 G IV SOLR
1250.0000 mg | INTRAVENOUS | Status: DC
  Administered 2018-06-26 – 2018-06-27 (×2): 1250 mg via INTRAVENOUS
  Filled 2018-06-25 (×3): qty 1250

## 2018-06-25 MED ORDER — METRONIDAZOLE IN NACL 5-0.79 MG/ML-% IV SOLN
500.0000 mg | Freq: Three times a day (TID) | INTRAVENOUS | Status: DC
  Administered 2018-06-25 – 2018-07-01 (×17): 500 mg via INTRAVENOUS
  Filled 2018-06-25 (×16): qty 100

## 2018-06-25 MED ORDER — OXYCODONE HCL 5 MG PO TABS
5.0000 mg | ORAL_TABLET | ORAL | Status: DC | PRN
  Administered 2018-06-26 – 2018-06-30 (×10): 5 mg via ORAL
  Filled 2018-06-25 (×11): qty 1

## 2018-06-25 MED ORDER — ACETAMINOPHEN 650 MG RE SUPP: 650.0000 mg | Freq: Four times a day (QID) | RECTAL | Status: DC | PRN

## 2018-06-25 MED ORDER — MULTI-VITAMIN/MINERALS PO TABS: 1.0000 | ORAL_TABLET | Freq: Every day | ORAL | Status: DC

## 2018-06-25 MED ORDER — HYPROMELLOSE (GONIOSCOPIC) 2.5 % OP SOLN
2.0000 [drp] | Freq: Two times a day (BID) | OPHTHALMIC | Status: DC
  Filled 2018-06-25: qty 15

## 2018-06-25 MED ORDER — DILTIAZEM HCL 60 MG PO TABS
90.0000 mg | ORAL_TABLET | Freq: Two times a day (BID) | ORAL | Status: DC
  Administered 2018-06-25 – 2018-06-30 (×7): 90 mg via ORAL
  Filled 2018-06-25 (×10): qty 2

## 2018-06-25 MED ORDER — VITAMIN D (ERGOCALCIFEROL) 1.25 MG (50000 UNIT) PO CAPS
50000.0000 [IU] | ORAL_CAPSULE | ORAL | Status: DC
  Administered 2018-06-29: 50000 [IU] via ORAL
  Filled 2018-06-25: qty 1

## 2018-06-25 MED ORDER — ONDANSETRON HCL 4 MG PO TABS: 4.0000 mg | ORAL_TABLET | Freq: Four times a day (QID) | ORAL | Status: DC | PRN

## 2018-06-25 MED ORDER — SACCHAROMYCES BOULARDII 250 MG PO CAPS
250.0000 mg | ORAL_CAPSULE | Freq: Two times a day (BID) | ORAL | Status: DC
  Administered 2018-06-25 – 2018-07-01 (×11): 250 mg via ORAL
  Filled 2018-06-25 (×11): qty 1

## 2018-06-25 MED ORDER — ACETAMINOPHEN 325 MG PO TABS
650.0000 mg | ORAL_TABLET | Freq: Four times a day (QID) | ORAL | Status: DC | PRN
  Administered 2018-06-28: 650 mg via ORAL
  Filled 2018-06-25 (×2): qty 2

## 2018-06-25 NOTE — Progress Notes (Signed)
ANTICOAGULATION CONSULT NOTE - Initial Consult  Pharmacy Consult for heparin Indication: atrial fibrillation  Allergies  Allergen Reactions  . Penicillins Hives    Did it involve swelling of the face/tongue/throat, SOB, or low BP? Unknown Did it involve sudden or severe rash/hives, skin peeling, or any reaction on the inside of your mouth or nose? Unknown Did you need to seek medical attention at a hospital or doctor's office? Unknown When did it last happen? Listed on [facility] If all above answers are "NO", may proceed with cephalosporin use.     Patient Measurements: Weight: 158 lb 11.7 oz (72 kg) Heparin Dosing Weight: 72.1 kg  Vital Signs: Temp: 97.8 F (36.6 C) (02/06 1612) Temp Source: Axillary (02/06 1612) BP: 101/73 (02/06 1600) Pulse Rate: 99 (02/06 1600)  Labs: Recent Labs    06/24/18  06/24/18 0245 07/17/2018 1341 07/01/2018 1430 07/01/2018 1451  HGB 12.8  --   --  13.4  --  13.9  HCT 40  --   --  42.0  --  41.0  PLT 180  --   --  211  --   --   CREATININE  --    < > 0.8  --  0.95 0.80   < > = values in this interval not displayed.    Estimated Creatinine Clearance: 55.5 mL/min (by C-G formula based on SCr of 0.8 mg/dL).   Medical History: Past Medical History:  Diagnosis Date  . Alcohol dependence in remission (HCC)   . Arthritis   . Bradycardia   . Cataract   . Chronic anticoagulation    Xarelto  . Chronic diastolic heart failure (HCC)   . CKD (chronic kidney disease), stage III (HCC)   . CVA (cerebral infarction)   . Dementia (HCC)   . Edema   . Gout   . Hyperlipidemia   . Hypertension   . Murmur   . Osteoarthrosis, generalized, involving multiple sites    INVOLVING RIGHT HIP ,KNEE. AND ANKLE  . Persistent atrial fibrillation    a. Dx 04/2016, on anticoag, not felt to be a candidate for amiodarone or cardioversion.  . Seizure disorder (HCC)   . Subclinical hyperthyroidism   . Vitamin D deficiency     Medications:  Scheduled:  .  collagenase   Topical BID    Assessment: 52 yof presenting with foul-smelling unstageable sacral decubitus ulcer. On Xarelto PTA for hx of Afib/CVA - last dose on 2/5@1800 .   Hgb 13.9, plt 211. Will monitor both aPTT and heparin level until correlate given recent Xarelto dosing. No s/sx of bleeding.   Goal of Therapy:  Heparin level 0.3-0.7 units/ml aPTT 66-102 seconds Monitor platelets by anticoagulation protocol: Yes   Plan:  Start heparin infusion at 1000 units/hr Check anti-Xa/aPTT level in 8 hours and daily while on heparin Continue to monitor H&H and platelets  Sherron Monday, PharmD, BCCCP Clinical Pharmacist  Pager: [phone] Phone: [phone] 06/26/2018,4:32 PM

## 2018-06-25 NOTE — ED Provider Notes (Signed)
[facility] EMERGENCY DEPARTMENT Provider Note   CSN: [id number] Arrival date & time: 07/05/2018  1331     History   Chief Complaint Chief Complaint  Patient presents with  . Failure To Thrive    HPI Regina Sandoval is a 78 y.o. female history of previous stroke on Xarelto, CKD, dementia, unstageable sacral decub ulcer who presented with failure to thrive.  Patient hasn't been eating and drinking much for the last several weeks and has been loosing weight. Patient also has unstageable sacral deub ulcer that has been managed with wound dressing. Patient recently had elevated white blood cell count and hyperkalemia and was put on doxycycline for the last several days.  Patient is from [facility].  The daughter had extensive discussion with the nurse practitioner, social worker and wound care nurse and activity director yesterday and CBC and BMP drawn yesterday and showed rising WBC count despite abx. Patient was put on tramadol for pain and started on eldertonic to increase appetite. Daughter requested that patient be sent to the ED for evaluation. Patient states that she feels fine and just has constant pain from the sacral decub ulcer.   The history is provided by the patient.    Past Medical History:  Diagnosis Date  . Alcohol dependence in remission (HCC)   . Arthritis   . Bradycardia   . Cataract   . Chronic anticoagulation    Xarelto  . Chronic diastolic heart failure (HCC)   . CKD (chronic kidney disease), stage III (HCC)   . CVA (cerebral infarction)   . Dementia (HCC)   . Edema   . Gout   . Hyperlipidemia   . Hypertension   . Murmur   . Osteoarthrosis, generalized, involving multiple sites    INVOLVING RIGHT HIP ,KNEE. AND ANKLE  . Persistent atrial fibrillation    a. Dx 04/2016, on anticoag, not felt to be a candidate for amiodarone or cardioversion.  . Seizure disorder (HCC)   . Subclinical hyperthyroidism   . Vitamin D deficiency      Patient Active Problem List   Diagnosis Date Noted  . Gout 07/24/2017  . Persistent atrial fibrillation 06/18/2016  . CKD (chronic kidney disease) 06/18/2016  . Chronic diastolic heart failure (HCC) 06/18/2016  . COPD exacerbation (HCC)   . CAP (community acquired pneumonia) 10/24/2015  . HYPERCHOLESTEROLEMIA 05/15/2010  . Dementia without behavioral disturbance (HCC) 05/15/2010  . Essential hypertension 05/15/2010  . ARTHRITIS 05/15/2010  . 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 URINALYSIS, ABNORMAL 05/15/2010  . ALCOHOL ABUSE, HX OF 05/15/2010    History reviewed. No pertinent surgical history.   OB History   No obstetric history on file.      Home Medications    Prior to Admission medications   Medication Sig Start Date End Date Taking? Authorizing Provider  acetaminophen (TYLENOL) 500 MG tablet Take 500 mg by mouth See admin instructions. Give BID prn for pain or fever, give q8h scheduled for pain.    [provider]  allopurinol (ZYLOPRIM) 300 MG tablet Take 300 mg by mouth daily.    [provider]  Amino Acids-Protein Hydrolys (FEEDING SUPPLEMENT, PRO-STAT SUGAR FREE 64,) LIQD Take 30 mLs by mouth 2 (two) times daily.    [provider]  atorvastatin (LIPITOR) 40 MG tablet Take 40 mg by mouth daily.     [provider]  bisacodyl (DULCOLAX) 10 MG suppository Place 10 mg rectally as needed for moderate constipation.  [provider]  Cholecalciferol (VITAMIN D3) 50000 units CAPS Take 1 capsule by mouth every 30 (thirty) days.    [provider]  collagenase (SANTYL) ointment Apply 1 application topically daily.    [provider]  diltiazem (CARDIZEM) 90 MG tablet Take 90 mg by mouth 2 (two) times daily.    [provider]  divalproex (DEPAKOTE SPRINKLE) 125 MG capsule Take 125 mg by mouth 2 (two) times daily.    [provider]  donepezil (ARICEPT) 10 MG tablet Take 10 mg by mouth  at bedtime.     [provider]  doxycycline (VIBRA-TABS) 100 MG tablet Take 100 mg by mouth 2 (two) times daily. 06/19/18 06/29/18  [provider]  fluticasone (FLONASE) 50 MCG/ACT nasal spray Place 1 spray into both nostrils daily as needed for allergies.     [provider]  furosemide (LASIX) 80 MG tablet Take 80 mg by mouth daily.     [provider]  hydroxypropyl methylcellulose / hypromellose (ISOPTO TEARS / GONIOVISC) 2.5 % ophthalmic solution Place 2 drops into both eyes 2 (two) times daily.    [provider]  ipratropium-albuterol (DUONEB) 0.5-2.5 (3) MG/3ML SOLN Take 3 mLs by nebulization 4 (four) times daily as needed (sob or wheezing).     [provider]  magnesium hydroxide (MILK OF MAGNESIA) 400 MG/5ML suspension Take by mouth daily as needed for mild constipation.    [provider]  Menthol, Topical Analgesic, (BIOFREEZE COLORLESS) 4 % GEL Apply 1 application topically 3 (three) times daily as needed (Pain). Apply to knees    [provider]  metoprolol (TOPROL-XL) 200 MG 24 hr tablet Take 200 mg by mouth daily.     [provider]  Multiple Vitamins-Minerals (MULTIVITAMIN WITH MINERALS) tablet Take 1 tablet by mouth daily.    [provider]  NUTRITIONAL SUPPLEMENT LIQD Take 120 mLs by mouth 2 (two) times daily. MedPass     [provider]  rivaroxaban (XARELTO) 20 MG TABS tablet Take 20 mg by mouth daily with supper.     [provider]  saccharomyces boulardii (FLORASTOR) 250 MG capsule Take 250 mg by mouth 2 (two) times daily. 06/19/18 06/27/2018  [provider]  traMADol (ULTRAM) 50 MG tablet Take 1 tablet (50 mg total) by mouth every 12 (twelve) hours as needed for moderate pain. 06/18/18   Medina-Vargas, Margit BandaMonina C, NP    Family History Family History  Problem Relation Age of Onset  . Asthma Grandchild   . Hypertension Father   . Diabetes Daughter     Social  History Social History   Tobacco Use  . Smoking status: Former Smoker    Packs/day: 1.00    Years: 50.00    Pack years: 50.00    Types: Cigarettes  . Smokeless tobacco: Never Used  Substance Use Topics  . Alcohol use: No    Alcohol/week: 0.0 standard drinks  . Drug use: No     Allergies   Penicillins   Review of Systems Review of Systems  Constitutional: Positive for appetite change.  Skin: Positive for wound.  All other systems reviewed and are negative.    Physical Exam Updated Vital Signs Wt 72 kg   BMI 24.86 kg/m   Physical Exam Vitals signs and nursing note reviewed.  Constitutional:      Comments: Chronically ill, laying on the left side, moaning with pain from her sacral decub ulcer   HENT:     Head:  Normocephalic.     Right Ear: Tympanic membrane normal.     Left Ear: Tympanic membrane normal.     Nose: Nose normal.     Mouth/Throat:     Mouth: Mucous membranes are dry.  Eyes:     Extraocular Movements: Extraocular movements intact.     Pupils: Pupils are equal, round, and reactive to light.  Neck:     Musculoskeletal: Normal range of motion.  Cardiovascular:     Rate and Rhythm: Normal rate and regular rhythm.  Pulmonary:     Effort: Pulmonary effort is normal.  Abdominal:     General: Abdomen is flat.     Palpations: Abdomen is soft.  Musculoskeletal: Normal range of motion.  Skin:    General: Skin is warm.     Capillary Refill: Capillary refill takes less than 2 seconds.     Comments: Large unstageable sacral decub ulcer.   Neurological:     General: No focal deficit present.  Psychiatric:        Mood and Affect: Mood normal.      ED Treatments / Results  Labs (all labs ordered are listed, but only abnormal results are displayed) Labs Reviewed  CULTURE, BLOOD (ROUTINE X 2)  CULTURE, BLOOD (ROUTINE X 2)  CBC WITH DIFFERENTIAL/PLATELET  COMPREHENSIVE METABOLIC PANEL  LACTIC ACID, PLASMA  LACTIC ACID, PLASMA  TSH     EKG None  Radiology No results found.  Procedures Procedures (including critical care time)  CRITICAL CARE Performed by: Richardean Canal   Total critical care time:30  minutes  Critical care time was exclusive of separately billable procedures and treating other patients.  Critical care was necessary to treat or prevent imminent or life-threatening deterioration.  Critical care was time spent personally by me on the following activities: development of treatment plan with patient and/or surrogate as well as nursing, discussions with consultants, evaluation of patient's response to treatment, examination of patient, obtaining history from patient or surrogate, ordering and performing treatments and interventions, ordering and review of laboratory studies, ordering and review of radiographic studies, pulse oximetry and re-evaluation of patient's condition.  Angiocath insertion Performed by: Richardean Canal  Consent: Verbal consent obtained. Risks and benefits: risks, benefits and alternatives were discussed Time out: Immediately prior to procedure a "time out" was called to verify the correct patient, procedure, equipment, support staff and site/side marked as required.  Preparation: Patient was prepped and draped in the usual sterile fashion.  Vein Location: R antecube  Ultrasound Guided  Gauge: 20 long   Normal blood return and flush without difficulty Patient tolerance: Patient tolerated the procedure well with no immediate complications.     Medications Ordered in ED Medications  sodium chloride 0.9 % bolus 1,000 mL (has no administration in time range)  fentaNYL (SUBLIMAZE) injection 50 mcg (has no administration in time range)     Initial Impression / Assessment and Plan / ED Course  I have reviewed the triage vital signs and the nursing notes.  Pertinent labs & imaging results that were available during my care of the patient were reviewed by me and considered  in my medical decision making (see chart for details).    Shaquinta Peruski is a 78 y.o. female here with failure to thrive, leukocytosis.Patient doesn't want any lab draws or imaging studies and refuses to lay on her back. She has known unstageable sacral ulcer and is on doxycycline for the last several days. Will repeat labs, do sepsis workup with  labs, lactate, cultures, CXR.   3:37 PM Patient unable to lay flat for CXR from her painful decub ulcer. WBC up to 27 now. I talked to Dr. Francena Hanly from surgery to evaluate the unstageable wound for possible debridement. Dr. Roseanna Rainbow from hospitalist to admit.   Final Clinical Impressions(s) / ED Diagnoses   Final diagnoses:  None    ED Discharge Orders    None       Charlynne Pander, MD 07/07/2018 [phone]

## 2018-06-25 NOTE — Progress Notes (Signed)
Regina Sandoval is a 78 y.o. female patient admitted from ED awake, alert - oriented  X 4 - no acute distress noted.   IV in place, occlusive dsg intact without redness.  Orientation to room, and floor completed with information packet given to patient/family.  Patient declined safety video at this time.  Admission INP armband ID verified with patient/family, and in place.   SR up x 2, fall assessment complete, with patient and family able to verbalize understanding of risk associated with falls, and verbalized understanding to call nsg before up out of bed.  Call light within reach, patient able to voice, and demonstrate understanding.  Skin, clean-dry- intact without evidence of bruising, or skin tears.   No evidence of skin break down noted on exam.     Will cont to eval and treat per MD orders.  Jeralene Peters, RN 06/29/2018 6:50 PM

## 2018-06-25 NOTE — H&P (Signed)
HISTORY AND PHYSICAL       PATIENT DETAILS Name: Regina Sandoval Age: 78 y.o. Sex: female Date of Birth: 1940/06/27 Admit Date: 06/30/2018 EXH:[id number], Titus Dubin, MD   Patient coming from: SNF    CHIEF COMPLAINT:  Sent from SNF for foul-smelling sacral decubitus ulceration and leukocytosis.  HPI: Regina Sandoval is a 78 y.o. female with medical history significant of dementia, hypertension, atrial fibrillation on anticoagulation with Xarelto, chronic diastolic heart failure with the above-noted complaints.  Per grandson at bedside-patient has been mostly bed to wheelchair bound for the past 2-3 months, per family-approximately 1 week back he was made aware of a sacral decubitus ulceration that had apparently developed.  Over the past few days-this ulceration apparently worsened, and started becoming foul-smelling.  SNF on doxycycline, and started monitoring patient's CBC.  Due to worsening leukocytosis despite antimicrobial therapy-patient was sent to the emergency room for further evaluation.  In the ED, she was found to have a foul-smelling unstageable sacral decubitus ulcer with significant leukocytosis-hence the hospitalist service was asked to admit this patient.  Patient is a poor historian given history of dementia-however cannot actually answer some questions appropriately.  Regina Sandoval is also at bedside.  There is no history of fever, nausea vomiting.  Apparently she was coughing last week-however she did not cough once while I was in the room.  Family is not aware of any abdominal pain.  ED Course:  Surgery consulted-given vancomycin and cefepime.  Note: Lives at: SNF Mobility:Bedbound Chronic Indwelling Foley:No   REVIEW OF SYSTEMS:  Constitutional:   No  weight loss, night sweats,  fatigue.  HEENT:    No headaches, Dysphagia,Tooth/dental problems,Sore throat  Cardio-vascular: No chest pain,Orthopnea, PND,lower extremity edema, anasarca, palpitations  GI:    No heartburn, indigestion, abdominal pain, nausea, vomiting, diarrhea, melena or hematochezia  Resp: No shortness of breath, cough, hemoptysis,plueritic chest pain.   Skin:  No rash or lesions.  GU:  No dysuria, change in color of urine, no urgency or frequency.    Musculoskeletal: No joint pain or swelling.    Endocrine: No heat intolerance  Psych: No change in mood or affect.    ALLERGIES:   Allergies  Allergen Reactions  . Penicillins Hives    Did it involve swelling of the face/tongue/throat, SOB, or low BP? Unknown Did it involve sudden or severe rash/hives, skin peeling, or any reaction on the inside of your mouth or nose? Unknown Did you need to seek medical attention at a hospital or doctor's office? Unknown When did it last happen? Listed on [facility] If all above answers are "NO", may proceed with cephalosporin use.     PAST MEDICAL HISTORY: Past Medical History:  Diagnosis Date  . Alcohol dependence in remission (HCC)   . Arthritis   . Bradycardia   . Cataract   . Chronic anticoagulation    Xarelto  . Chronic diastolic heart failure (HCC)   . CKD (chronic kidney disease), stage III (HCC)   . CVA (cerebral infarction)   . Dementia (HCC)   . Edema   . Gout   . Hyperlipidemia   . Hypertension   . Murmur   . Osteoarthrosis, generalized, involving multiple sites    INVOLVING RIGHT HIP ,KNEE. AND ANKLE  . Persistent atrial fibrillation    a. Dx 04/2016, on anticoag, not felt to be a candidate for amiodarone or cardioversion.  . Seizure disorder (HCC)   . Subclinical  hyperthyroidism   . Vitamin D deficiency     PAST SURGICAL HISTORY: History reviewed. No pertinent surgical history.  MEDICATIONS AT HOME: Prior to Admission medications   Medication Sig Start Date End Date Taking? Authorizing Provider  acetaminophen (TYLENOL) 500 MG tablet Take 500 mg by mouth 2 (two) times daily as needed (pain).    Yes [provider]  acetaminophen  (TYLENOL) 500 MG tablet Take 500 mg by mouth every 8 (eight) hours.   Yes [provider]  bisacodyl (DULCOLAX) 10 MG suppository Place 10 mg rectally as needed for moderate constipation.   Yes [provider]  doxycycline (VIBRA-TABS) 100 MG tablet Take 100 mg by mouth 2 (two) times daily. 06/19/18 06/29/18 Yes [provider]  fluticasone (FLONASE) 50 MCG/ACT nasal spray Place 1 spray into both nostrils daily as needed for allergies.    Yes [provider]  ipratropium-albuterol (DUONEB) 0.5-2.5 (3) MG/3ML SOLN Take 3 mLs by nebulization 4 (four) times daily as needed (sob or wheezing).    Yes [provider]  magnesium hydroxide (MILK OF MAGNESIA) 400 MG/5ML suspension Take by mouth daily as needed for mild constipation.   Yes [provider]  NUTRITIONAL SUPPLEMENT LIQD Take 120 mLs by mouth 2 (two) times daily. [employer]    Yes [provider]  saccharomyces boulardii (FLORASTOR) 250 MG capsule Take 250 mg by mouth 2 (two) times daily. 06/19/18 07/05/2018 Yes [provider]  traMADol (ULTRAM) 50 MG tablet Take 1 tablet (50 mg total) by mouth every 12 (twelve) hours as needed for moderate pain. 06/18/18  Yes Medina-Vargas, Monina C, NP  allopurinol (ZYLOPRIM) 300 MG tablet Take 300 mg by mouth daily.    [provider]  Amino Acids-Protein Hydrolys (FEEDING SUPPLEMENT, PRO-STAT SUGAR FREE 64,) LIQD Take 30 mLs by mouth 2 (two) times daily.    [provider]  atorvastatin (LIPITOR) 40 MG tablet Take 40 mg by mouth daily.     [provider]  Cholecalciferol (VITAMIN D3) 50000 units CAPS Take 1 capsule by mouth every 30 (thirty) days.    [provider]  collagenase (SANTYL) ointment Apply 1 application topically daily.    [provider]  diltiazem (CARDIZEM) 90 MG tablet Take 90 mg by mouth 2 (two) times daily.    [provider]  divalproex (DEPAKOTE SPRINKLE) 125 MG capsule Take  125 mg by mouth 2 (two) times daily.    [provider]  donepezil (ARICEPT) 10 MG tablet Take 10 mg by mouth at bedtime.     [provider]  furosemide (LASIX) 80 MG tablet Take 80 mg by mouth daily.     [provider]  hydroxypropyl methylcellulose / hypromellose (ISOPTO TEARS / GONIOVISC) 2.5 % ophthalmic solution Place 2 drops into both eyes 2 (two) times daily.    [provider]  Menthol, Topical Analgesic, (BIOFREEZE COLORLESS) 4 % GEL Apply 1 application topically 3 (three) times daily as needed (Pain). Apply to knees    [provider]  metoprolol (TOPROL-XL) 200 MG 24 hr tablet Take 200 mg by mouth daily.     [provider]  Multiple Vitamins-Minerals (MULTIVITAMIN WITH MINERALS) tablet Take 1 tablet by mouth daily.    [provider]  rivaroxaban (XARELTO) 20 MG TABS tablet Take 20 mg by mouth daily with supper.     [provider]    FAMILY HISTORY: Family History  Problem Relation Age of Onset  . Asthma Grandchild   .  Hypertension Father   . Diabetes Daughter     SOCIAL HISTORY:  reports that she has quit smoking. Her smoking use included cigarettes. She has a 50.00 pack-year smoking history. She has never used smokeless tobacco. She reports that she does not drink alcohol or use drugs.  PHYSICAL EXAM: Blood pressure 101/73, pulse 99, weight 72 kg, SpO2 100 %.  General appearance :Awake, alert-but at times pleasantly confused. Eyes:, pupils equally reactive to light and accomodation,no scleral icterus.Pink conjunctiva HEENT: Atraumatic and Normocephalic Neck: supple, no JVD.  Resp:Good air entry bilaterally, no added sounds  CVS: S1 S2 regular, 2/6 systolic murmur. GI: Bowel sounds present, Non tender and not distended with no gaurding, rigidity or rebound. Extremities: B/L Lower Ext shows no edema Neurology: Difficult exam-but patient appears nonfocal. Musculoskeletal:gait appears to be normal.No  digital cyanosis Skin:No Rash, warm and dry Wounds: See picture below       LABS ON ADMISSION:  I have personally reviewed following labs and imaging studies  CBC: Recent Labs  Lab 06/24/18 07/04/2018 1341 07/11/2018 1451  WBC 24.2 27.8*  --   NEUTROABS 22 26.1*  --   HGB 12.8 13.4 13.9  HCT 40 42.0 41.0  MCV  --  93.8  --   PLT 180 211  --     Basic Metabolic Panel: Recent Labs  Lab 06/24/18 0237 06/24/18 0245 06/29/2018 1430 07/12/2018 1451  NA 145 142 141 141  K 4.4 3.9 5.1 4.4  CL  --   --  108  --   CO2  --   --  19*  --   GLUCOSE  --   --  131*  --   BUN 40* 40* 40*  --   CREATININE 0.8 0.8 0.95 0.80  CALCIUM  --   --  9.2  --     GFR: Estimated Creatinine Clearance: 55.5 mL/min (by C-G formula based on SCr of 0.8 mg/dL).  Liver Function Tests: Recent Labs  Lab 07/11/2018 1430  AST 53*  ALT 52*  ALKPHOS 123  BILITOT 1.2  PROT 7.4  ALBUMIN 2.0*   No results for input(s): LIPASE, AMYLASE in the last 168 hours. No results for input(s): AMMONIA in the last 168 hours.  Coagulation Profile: No results for input(s): INR, PROTIME in the last 168 hours.  Cardiac Enzymes: No results for input(s): CKTOTAL, CKMB, CKMBINDEX, TROPONINI in the last 168 hours.  BNP (last 3 results) No results for input(s): PROBNP in the last 8760 hours.  HbA1C: No results for input(s): HGBA1C in the last 72 hours.  CBG: No results for input(s): GLUCAP in the last 168 hours.  Lipid Profile: No results for input(s): CHOL, HDL, LDLCALC, TRIG, CHOLHDL, LDLDIRECT in the last 72 hours.  Thyroid Function Tests: Recent Labs    07/12/2018 1343  TSH 1.399    Anemia Panel: No results for input(s): VITAMINB12, FOLATE, FERRITIN, TIBC, IRON, RETICCTPCT in the last 72 hours.  Urine analysis: No results found for: COLORURINE, APPEARANCEUR, LABSPEC, PHURINE, GLUCOSEU, HGBUR, BILIRUBINUR, KETONESUR, PROTEINUR, UROBILINOGEN, NITRITE, LEUKOCYTESUR  Sepsis Labs: Lactic Acid, Venous      Component Value Date/Time   LATICACIDVEN 2.9 (HH) 06/30/2018 1341     Microbiology: No results found for this or any previous visit (from the past 240 hour(s)).    RADIOLOGIC STUDIES ON ADMISSION: No results found.  EKG:  Ordered but refused  ASSESSMENT AND PLAN: Sepsis secondary to infected sacral decubitus ulcer-unstageable: We will start empiric vancomycin, Rocephin and Flagyl.  General  surgery consulted-recommendations are wound care with hydrotherapy for a few days-General surgery will then assess if patient requires surgical debridement.  Blood cultures have been ordered and are currently pending.  Persistent atrial fibrillation: Rate controlled with diltiazem and metoprolol-holding Xarelto as patient may require surgical debridement of sacral decubitus ulcer-start IV heparin in the meantime.  Chronic diastolic heart failure: Actually appears to be slightly on the dry side-hold diuretics-reassess tomorrow.  Hydrate gently overnight.   Dementia: At risk for delirium-maintain delirium precautions.  Continue Aricept, Depakote  Dyslipidemia: Continue statin  Hypertension: Continue Cardizem and metoprolol-follow and optimize accordingly  ?  History of COPD: Continue as needed bronchodilators  Further plan will depend as patient's clinical course evolves and further radiologic and laboratory data become available. Patient will be monitored closely.  Above noted plan was discussed with patient/grandson face to face at bedside, they were in agreement.   CONSULTS: General surgery  DVT Prophylaxis: IV heparin  Code Status: Full Code  Disposition Plan:  Discharge back home possibly in 3 days, depending on clinical course  Admission status: Inpatient going to medical floor   The medical decision making on this patient was of high complexity and the patient is at high risk for clinical deterioration, therefore this is a level 3 visit.   Total time spent  55 minutes.Greater  than 50% of this time was spent in counseling, explanation of diagnosis, planning of further management, and coordination of care.  Severity of illness: The appropriate patient status for this patient is INPATIENT. Inpatient status is judged to be reasonable and necessary in order to provide the required intensity of service to ensure the patient's safety. The patient's presenting symptoms, physical exam findings, and initial radiographic and laboratory data in the context of their chronic comorbidities is felt to place them at high risk for further clinical deterioration. Furthermore, it is not anticipated that the patient will be medically stable for discharge from the hospital within 2 midnights of admission. The following factors support the patient status of inpatient.   " The patient's presenting symptoms include foul-smelling sacral decubitus ulcer " The worrisome physical exam findings include necrotic tissue at the sacral decubitus ulcer. " The initial radiographic and laboratory data are worrisome because of significant leukocytosis " The chronic co-morbidities include dementia, hypertension, atrial fibrillation  * I certify that at the point of admission it is my clinical judgment that the patient will require inpatient hospital care spanning beyond 2 midnights from the point of admission due to high intensity of service, high risk for further deterioration and high frequency of surveillance required.**  Regina Sandoval [facility] Pager [phone]  If 7PM-7AM, please contact night-coverage [email or web address] Password [email or web address] Jul 04, 2018, 4:11 PM

## 2018-06-25 NOTE — ED Triage Notes (Signed)
Pt here from [facility] via EMS for failure to thrive. Pt hasn't taken her meds or ate. Family concerned and wanted a second opinion. Pt alert to self, place and situation

## 2018-06-25 NOTE — ED Notes (Signed)
Difficult IV start x 1. Pt refusing to be placed on EKG and refused xray. EDP aware.

## 2018-06-25 NOTE — Consult Note (Signed)
[facility] Consult Note  Regina Sandoval Aug 22, 1940  [id number].    Requesting MD: Silverio LayYao Chief Complaint/Reason for Consult: sacral wound  HPI:  Patient is a 78 year old female who [facility]. [facility] is from [facility]. PMH significant for CVA on xarelto, CKD, dementia, and unstageable sacral wound. Patient reports wound has been there for a few weeks but is getting better. Grandson present at bedside and reports daughter is HCPOA for patient. Patient reports she does ambulate to bathroom. She is sensate in sacral region. Allergic to PCNs. We are asked to evaluate for possible debridement of sacral wound.   ROS: Review of Systems  Constitutional: Positive for weight loss. Negative for chills and fever.  Respiratory: Negative for shortness of breath.   Cardiovascular: Negative for chest pain.  Gastrointestinal: Negative for abdominal pain.       Appetite loss  All other systems reviewed and are negative. ROS limited secondary to dementia  Family History  Problem Relation Age of Onset  . Asthma Grandchild   . Hypertension Father   . Diabetes Daughter     Past Medical History:  Diagnosis Date  . Alcohol dependence in remission (HCC)   . Arthritis   . Bradycardia   . Cataract   . Chronic anticoagulation    Xarelto  . Chronic diastolic heart failure (HCC)   . CKD (chronic kidney disease), stage III (HCC)   . CVA (cerebral infarction)   . Dementia (HCC)   . Edema   . Gout   . Hyperlipidemia   . Hypertension   . Murmur   . Osteoarthrosis, generalized, involving multiple sites    INVOLVING RIGHT HIP ,KNEE. AND ANKLE  . Persistent atrial fibrillation    a. Dx 04/2016, on anticoag, not felt to be a candidate for amiodarone or cardioversion.  . Seizure disorder (HCC)   . Subclinical hyperthyroidism   . Vitamin D deficiency     History reviewed. No pertinent surgical history.  Social History:  reports that she has quit smoking. Her  smoking use included cigarettes. She has a 50.00 pack-year smoking history. She has never used smokeless tobacco. She reports that she does not drink alcohol or use drugs.  Allergies:  Allergies  Allergen Reactions  . Penicillins Hives    Did it involve swelling of the face/tongue/throat, SOB, or low BP? Unknown Did it involve sudden or severe rash/hives, skin peeling, or any reaction on the inside of your mouth or nose? Unknown Did you need to seek medical attention at a hospital or doctor's office? Unknown When did it last happen? [facility] If all above answers are "NO", may proceed with cephalosporin use.     (Not in a hospital admission)   Blood pressure 128/65, pulse 92, weight 72 kg, SpO2 100 %. Physical Exam: Physical Exam Constitutional:      General: She is not in acute distress.    Appearance: She is well-developed.     Comments: Frail appearing   HENT:     Head: Normocephalic and atraumatic.     Right Ear: External ear normal.     Left Ear: External ear normal.     Nose: Nose normal.  Eyes:     General: Lids are normal. No scleral icterus. Neck:     Musculoskeletal: Neck supple.  Cardiovascular:     Rate and Rhythm: Normal rate and regular rhythm.  Pulmonary:     Effort: Pulmonary effort is normal.  Abdominal:  General: There is no distension.     Palpations: Abdomen is soft.     Tenderness: There is no abdominal tenderness.  Genitourinary:    Comments: See photo below - sacral wound with necrotic base and wound edges, area is sensate Musculoskeletal:     Comments: Moving bilateral UEs  Neurological:     Mental Status: She is alert.  Psychiatric:        Behavior: Behavior is cooperative.        Results for orders placed or performed during the hospital encounter of Jul 19, 2018 (from the past 48 hour(s))  CBC with Differential/Platelet     Status: Abnormal   Collection Time: 07/19/2018  1:41 PM  Result Value Ref Range   WBC 27.8 (H) 4.0 -  10.5 K/uL   RBC 4.48 3.87 - 5.11 MIL/uL   Hemoglobin 13.4 12.0 - 15.0 g/dL   HCT 62.7 03.5 - 00.9 %   MCV 93.8 80.0 - 100.0 fL   MCH 29.9 26.0 - 34.0 pg   MCHC 31.9 30.0 - 36.0 g/dL   RDW 38.1 (H) 82.9 - 93.7 %   Platelets 211 150 - 400 K/uL   nRBC 0.2 0.0 - 0.2 %   Neutrophils Relative % 94 %   Neutro Abs 26.1 (H) 1.7 - 7.7 K/uL   Lymphocytes Relative 3 %   Lymphs Abs 0.8 0.7 - 4.0 K/uL   Monocytes Relative 3 %   Monocytes Absolute 0.8 0.1 - 1.0 K/uL   Eosinophils Relative 0 %   Eosinophils Absolute 0.0 0.0 - 0.5 K/uL   Basophils Relative 0 %   Basophils Absolute 0.0 0.0 - 0.1 K/uL   nRBC 0 0 /100 WBC   Abs Immature Granulocytes 0.00 0.00 - 0.07 K/uL   Acanthocytes PRESENT    Burr Cells PRESENT    Polychromasia PRESENT     Comment: Performed at [facility], [address], [place], [place] [address]  Lactic acid, plasma     Status: Abnormal   Collection Time: Jul 19, 2018  1:41 PM  Result Value Ref Range   Lactic Acid, Venous 2.9 (HH) 0.5 - 1.9 mmol/L    Comment: CRITICAL RESULT CALLED TO, READ BACK BY AND VERIFIED WITH: J RASLEY,RN 1525 19-Jul-2018 WBOND Performed at [facility], [address] [place], [place] [address]   TSH     Status: None   Collection Time: July 19, 2018  1:43 PM  Result Value Ref Range   TSH 1.399 0.350 - 4.500 uIU/mL    Comment: Performed by a 3rd Generation assay with a functional sensitivity of <=0.01 uIU/mL. Performed at [facility], [address] [place], [place] [address]   Basic metabolic panel     Status: Abnormal   Collection Time: 19-Jul-2018  2:30 PM  Result Value Ref Range   Sodium 141 135 - 145 mmol/L   Potassium 5.1 3.5 - 5.1 mmol/L   Chloride 108 98 - 111 mmol/L   CO2 19 (L) 22 - 32 mmol/L   Glucose, Bld 131 (H) 70 - 99 mg/dL   BUN 40 (H) 8 - 23 mg/dL   Creatinine, Ser 0.25 0.44 - 1.00 mg/dL   Calcium 9.2 8.9 - 85.2 mg/dL   GFR calc non Af Amer 57 (L) >60 mL/min   GFR calc Af Amer >60 >60 mL/min   Anion gap 14 5  - 15    Comment: Performed at [facility], [address] [place], [place] [address]  Hepatic function panel     Status: Abnormal   Collection Time: 07/17/2018  2:30 PM  Result Value Ref Range   Total Protein 7.4 6.5 - 8.1 g/dL   Albumin 2.0 (L) 3.5 - 5.0 g/dL   AST 53 (H) 15 - 41 U/L   ALT 52 (H) 0 - 44 U/L   Alkaline Phosphatase 123 38 - 126 U/L   Total Bilirubin 1.2 0.3 - 1.2 mg/dL   Bilirubin, Direct 0.4 (H) 0.0 - 0.2 mg/dL   Indirect Bilirubin 0.8 0.3 - 0.9 mg/dL    Comment: Performed at [facility], [address] [place], [place] [address]  I-Stat Creatinine, ED (do not order at [facility])     Status: None   Collection Time: 06/29/2018  2:51 PM  Result Value Ref Range   Creatinine, Ser 0.80 0.44 - 1.00 mg/dL  POCT I-Stat EG7     Status: Abnormal   Collection Time: 07/16/2018  2:51 PM  Result Value Ref Range   pH, Ven 7.415 7.250 - 7.430   pCO2, Ven 34.7 (L) 44.0 - 60.0 mmHg   pO2, Ven 39.0 32.0 - 45.0 mmHg   Bicarbonate 22.3 20.0 - 28.0 mmol/L   TCO2 23 22 - 32 mmol/L   O2 Saturation 75.0 %   Acid-base deficit 2.0 0.0 - 2.0 mmol/L   Sodium 141 135 - 145 mmol/L   Potassium 4.4 3.5 - 5.1 mmol/L   Calcium, Ion 1.15 1.15 - 1.40 mmol/L   HCT 41.0 36.0 - 46.0 %   Hemoglobin 13.9 12.0 - 15.0 g/dL   Patient temperature HIDE    Sample type VENOUS    No results found.    Assessment/Plan Sacral decubitus ulcer - patient takes xarelto PTA, last dose PM of 2/5 - recommend transitioning to heparin while admitted - PT hydrotherapy daily through the weekend  - santyl and BID wet to dry dressing - we will re-evaluate Sunday vs Monday for possible operative debridement  Wells Guiles, [facility] 07/10/2018, 4:02 PM Pager: [phone] Consults: [phone]

## 2018-06-25 NOTE — Progress Notes (Signed)
Pharmacy Antibiotic Note  Regina Sandoval is a 78 y.o. female admitted on 07/12/2018 with wound infection.  Pharmacy has been consulted for vancomycin dosing.  Presented with foul-smelling decubitus ulcer, being treated on doxycycline with worsening leukocytosis today. WBC 27.8, afebrile, LA 2.9. Scr 0.8 (CrCl 55 mL/min). PCN allergy listed- has tolerated cephalosporins previously.   Plan: Vancomycin 2000 mg IV once then 1250 mg  IV every 24 hours. Monitor renal fx, clinical pic, cx results, and level as appropriate  Weight: 158 lb 11.7 oz (72 kg)  Temp (24hrs), Avg:97.8 F (36.6 C), Min:97.8 F (36.6 C), Max:97.8 F (36.6 C)  Recent Labs  Lab 06/24/18 06/24/18 0237 06/24/18 0245 07/06/2018 1341 07/09/2018 1430 07/01/2018 1451  WBC 24.2  --   --  27.8*  --   --   CREATININE  --  0.8 0.8  --  0.95 0.80  LATICACIDVEN  --   --   --  2.9*  --   --     Estimated Creatinine Clearance: 55.5 mL/min (by C-G formula based on SCr of 0.8 mg/dL).    Allergies  Allergen Reactions  . Penicillins Hives    Did it involve swelling of the face/tongue/throat, SOB, or low BP? Unknown Did it involve sudden or severe rash/hives, skin peeling, or any reaction on the inside of your mouth or nose? Unknown Did you need to seek medical attention at a hospital or doctor's office? Unknown When did it last happen? Listed on [facility] If all above answers are "NO", may proceed with cephalosporin use.     Antimicrobials this admission: Cefepime 2/6 >>  Vancomycin 2/6 >>   Dose adjustments this admission: N/A  Microbiology results: 2/6 BCx: sent  Thank you for allowing pharmacy to be a part of this patient's care.  Sherron Monday, PharmD, BCCCP Clinical Pharmacist  Pager: [phone] Phone: [phone] 07/11/2018 4:39 PM

## 2018-06-25 NOTE — Progress Notes (Signed)
Pt refusing to be sat up in the bed/ refusing IV placment

## 2018-06-25 NOTE — ED Notes (Signed)
Pt has been stuck twice by another RN- and upon meeting this pt she is refusing IV or blood work because she states "she just had this done". Pt request to she doctor- MD Silverio Lay made aware.

## 2018-06-25 NOTE — ED Notes (Signed)
Pt refusing EKG and BP cuff to be on arm.

## 2018-06-25 NOTE — ED Notes (Signed)
Pt refusing to allow this EMT to touch her for EKG.

## 2018-06-26 DIAGNOSIS — F0390 Unspecified dementia without behavioral disturbance: Secondary | ICD-10-CM

## 2018-06-26 DIAGNOSIS — L8990 Pressure ulcer of unspecified site, unspecified stage: Secondary | ICD-10-CM | POA: Diagnosis not present

## 2018-06-26 DIAGNOSIS — I10 Essential (primary) hypertension: Secondary | ICD-10-CM | POA: Diagnosis not present

## 2018-06-26 DIAGNOSIS — I5032 Chronic diastolic (congestive) heart failure: Secondary | ICD-10-CM | POA: Diagnosis not present

## 2018-06-26 LAB — CBC
HCT: 37.5 % (ref 36.0–46.0)
Hemoglobin: 11.9 g/dL — ABNORMAL LOW (ref 12.0–15.0)
MCH: 29 pg (ref 26.0–34.0)
MCHC: 31.7 g/dL (ref 30.0–36.0)
MCV: 91.5 fL (ref 80.0–100.0)
NRBC: 0.1 % (ref 0.0–0.2)
Platelets: 168 10*3/uL (ref 150–400)
RBC: 4.1 MIL/uL (ref 3.87–5.11)
RDW: 18.1 % — ABNORMAL HIGH (ref 11.5–15.5)
WBC: 26.9 10*3/uL — ABNORMAL HIGH (ref 4.0–10.5)

## 2018-06-26 LAB — BASIC METABOLIC PANEL
Anion gap: 13 (ref 5–15)
BUN: 36 mg/dL — ABNORMAL HIGH (ref 8–23)
CHLORIDE: 108 mmol/L (ref 98–111)
CO2: 19 mmol/L — ABNORMAL LOW (ref 22–32)
Calcium: 8.8 mg/dL — ABNORMAL LOW (ref 8.9–10.3)
Creatinine, Ser: 0.68 mg/dL (ref 0.44–1.00)
GFR calc Af Amer: >60 mL/min (ref >60)
GFR calc non Af Amer: >60 mL/min (ref >60)
Glucose, Bld: 113 mg/dL — ABNORMAL HIGH (ref 70–99)
Potassium: 4.2 mmol/L (ref 3.5–5.1)
Sodium: 140 mmol/L (ref 135–145)

## 2018-06-26 LAB — APTT: aPTT: 90 seconds — ABNORMAL HIGH (ref 24–36)

## 2018-06-26 LAB — HEPARIN LEVEL (UNFRACTIONATED): Heparin Unfractionated: 1.42 IU/mL — ABNORMAL HIGH (ref 0.30–0.70)

## 2018-06-26 MED ORDER — ENSURE ENLIVE PO LIQD
237.0000 mL | Freq: Two times a day (BID) | ORAL | Status: DC
  Administered 2018-06-28 – 2018-06-30 (×6): 237 mL via ORAL

## 2018-06-26 MED ORDER — FUROSEMIDE 80 MG PO TABS
80.0000 mg | ORAL_TABLET | Freq: Every day | ORAL | Status: DC
  Administered 2018-06-26 – 2018-06-30 (×5): 80 mg via ORAL
  Filled 2018-06-26 (×5): qty 1

## 2018-06-26 MED ORDER — GUAIFENESIN 100 MG/5ML PO SOLN
10.0000 mL | Freq: Four times a day (QID) | ORAL | Status: DC | PRN
  Filled 2018-06-26: qty 5

## 2018-06-26 NOTE — Progress Notes (Signed)
Patient adamantly refuses any turning or repositioning, she yells and gets angry and physically fights when I have tried to reposition her.

## 2018-06-26 NOTE — Progress Notes (Signed)
Patient's decubitis wound dressing changed. Cleansed and wet to dry dressing with Santyl as ordered. Sacral foam placed.  Skin to skin assessment- excoriation on right hip, left necrotic heel, dry flaky feet and excoriation around bilateral ankles. Patient has blue "bunny boots" for feet; due to discomfort only right boot replaced after skin to skin assessment. Patient declined pain medication.

## 2018-06-26 NOTE — Progress Notes (Signed)
[facility]/Trauma Progress Note      Assessment/Plan Principal Problem:   Infected decubitus ulcer, unspecified pressure ulcer stage Active Problems:   HYPERCHOLESTEROLEMIA   Dementia without behavioral disturbance (HCC)   Essential hypertension   Persistent atrial fibrillation   CKD (chronic kidney disease)   Chronic diastolic heart failure (HCC)  Sacral decubitus ulcer - patient takes xarelto PTA, last dose PM of 2/5 - recommend transitioning to heparin while admitted - PT hydrotherapy daily through the weekend  - santyl and BID wet to dry dressing - we will re-evaluate Sunday vs Monday for possible operative debridement  FEN: heart healthy VTE: heparin ID: Vanc, Flagyl & Rocephin 02/06 Follow up: TBD    LOS: 1 day    Subjective: CC: sacral wound  Pain with pressure on bottom. Pt about to undergo hydrotherapy  Objective: Vital signs in last 24 hours: Temp:  [97.5 F (36.4 C)-97.8 F (36.6 C)] 97.5 F (36.4 C) (02/06 2157) Pulse Rate:  [92-109] 109 (02/06 2157) Resp:  [16-17] 17 (02/06 1815) BP: (101-141)/(44-81) 107/70 (02/06 2334) SpO2:  [99 %-100 %] 99 % (02/06 2157) Weight:  [72 kg] 72 kg (02/06 1339) Last BM Date: 06/24/18  Intake/Output from previous day: 02/06 0701 - 02/07 0700 In: 1100 [IV Piggyback:1100] Out: 140 [Urine:140] Intake/Output this shift: No intake/output data recorded.  PE: Gen:  Alert, NAD, pleasant, cooperative Pulm:  Rate and effort normal GU: full thickness sacral wound with foul odor, see photo below  Skin: warm and dry      Anti-infectives: Anti-infectives (From admission, onward)   Start     Dose/Rate Route Frequency Ordered Stop   06/26/18 1800  vancomycin (VANCOCIN) 1,250 mg in sodium chloride 0.9 % 250 mL IVPB     1,250 mg 166.7 mL/hr over 90 Minutes Intravenous Every 24 hours 06/20/2018 1632     07/11/2018 2100  cefTRIAXone (ROCEPHIN) 2 g in sodium chloride 0.9 % 100 mL IVPB     2 g 200 mL/hr over 30  Minutes Intravenous Every 24 hours 07/06/2018 1753     06/27/2018 1800  metroNIDAZOLE (FLAGYL) IVPB 500 mg     500 mg 100 mL/hr over 60 Minutes Intravenous Every 8 hours 06/26/2018 1753     07/09/2018 1630  vancomycin (VANCOCIN) 1,500 mg in sodium chloride 0.9 % 500 mL IVPB     1,500 mg 250 mL/hr over 120 Minutes Intravenous  Once 06/23/2018 1617     02 /06/20 1515  vancomycin (VANCOCIN) IVPB 1000 mg/200 mL premix  Status:  Discontinued     1,000 mg 200 mL/hr over 60 Minutes Intravenous  Once 07/18/18 1505 07/18/18 1617   07-18-18 1515  ceFEPIme (MAXIPIME) 2 g in sodium chloride 0.9 % 100 mL IVPB     2 g 200 mL/hr over 30 Minutes Intravenous  Once 07/18/18 1505 2018-07-18 1637      Lab Results:  Recent Labs    07/18/18 1341 07/18/18 1451 06/26/18 0241  WBC 27.8*  --  26.9*  HGB 13.4 13.9 11.9*  HCT 42.0 41.0 37.5  PLT 211  --  168   BMET Recent Labs    07/18/18 1430 18-Jul-2018 1451 06/26/18 0241  NA 141 141 140  K 5.1 4.4 4.2  CL 108  --  108  CO2 19*  --  19*  GLUCOSE 131*  --  113*  BUN 40*  --  36*  CREATININE 0.95 0.80 0.68  CALCIUM 9.2  --  8.8*   PT/INR No results for  input(s): LABPROT, INR in the last 72 hours. CMP     Component Value Date/Time   NA 140 06/26/2018 0241   NA 142 06/24/2018 0245   K 4.2 06/26/2018 0241   CL 108 06/26/2018 0241   CO2 19 (L) 06/26/2018 0241   GLUCOSE 113 (H) 06/26/2018 0241   BUN 36 (H) 06/26/2018 0241   BUN 40 (A) 06/24/2018 0245   CREATININE 0.68 06/26/2018 0241   CALCIUM 8.8 (L) 06/26/2018 0241   PROT 7.4 06/22/2018 1430   PROT 7.0 12/24/2016 0849   ALBUMIN 2.0 (L) 07/14/2018 1430   ALBUMIN 3.8 12/24/2016 0849   AST 53 (H) 06/24/2018 1430   ALT 52 (H) 07/09/2018 1430   ALKPHOS 123 07/12/2018 1430   BILITOT 1.2 06/20/2018 1430   BILITOT 0.6 12/24/2016 0849   GFRNONAA >60 06/26/2018 0241   GFRAA >60 06/26/2018 0241   Lipase  No results found for: LIPASE  Studies/Results: No results found.    Jerre Simon ,  [facility] 06/26/2018, 10:20 AM  Pager: [phone] Mon-Wed, Friday 7:00am-4:30pm Thurs 7am-11:30am  Consults: [phone]

## 2018-06-26 NOTE — Evaluation (Signed)
Occupational Therapy Evaluation Patient Details Name: Regina Sandoval MRN: [id number] DOB: 1940/09/30 Today's Date: 06/26/2018    History of Present Illness 78 y.o. female with history of dementia, PAF, dyslipidemia, nonambulatory status-mostly bed to wheelchair bound-resident of SNF-presented with sepsis secondary to infected unstageable sacral decubitus ulcer.   Clinical Impression   Pt admitted with the above diagnoses and presents with below problem list. Pt will benefit from continued acute OT to address the below listed deficits and maximize independence with basic ADLs. Per chart review and phone call to [facility], pt is total care for UB/LB ADLs, dependent for transfers but was able to feed herself with setup. Pt presents with significant generalized weakness,currently min A for eating a full meal. No family present on eval.    Follow Up Recommendations  SNF    Equipment Recommendations  Other (comment)(defer to next venue)    Recommendations for Other Services       Precautions / Restrictions Precautions Precautions: Fall Restrictions Weight Bearing Restrictions: No      Mobility Bed Mobility                  Transfers                      Balance                                           ADL either performed or assessed with clinical judgement   ADL Overall ADL's : Needs assistance/impaired Eating/Feeding: Minimal assistance;Bed level   Grooming: Total assistance   Upper Body Bathing: Total assistance   Lower Body Bathing: Total assistance   Upper Body Dressing : Total assistance   Lower Body Dressing: Total assistance   Toilet Transfer: Total assistance   Toileting- Clothing Manipulation and Hygiene: Total assistance   Tub/ Shower Transfer: Total assistance     General ADL Comments: bed level eval     Vision         Perception     Praxis      Pertinent Vitals/Pain Pain Assessment: Faces Faces Pain  Scale: Hurts little more Pain Location: sacral wound Pain Descriptors / Indicators: Sore Pain Intervention(s): Monitored during session     Hand Dominance     Extremity/Trunk Assessment Upper Extremity Assessment Upper Extremity Assessment: Generalized weakness   Lower Extremity Assessment Lower Extremity Assessment: Defer to PT evaluation       Communication Communication Communication: HOH   Cognition Arousal/Alertness: Awake/alert Behavior During Therapy: WFL for tasks assessed/performed;Flat affect Overall Cognitive Status: History of cognitive impairments - at baseline                                     General Comments  No family present on eval.     Exercises Exercises: Other exercises Other Exercises Other Exercises: provided therapy squeeze ball for instrinic hand strengthening    Shoulder Instructions      Home Living Family/patient expects to be discharged to:: Skilled nursing facility                                        Prior Functioning/Environment Level of Independence: Needs assistance  Gait / Transfers Assistance  Needed: mostly bed bound, dependent for transfers ADL's / Homemaking Assistance Needed: per contact at [facility] pt was total care with bathing/dressing, able to self-fed, dependent for transfers             OT Problem List: Decreased strength;Decreased activity tolerance;Decreased knowledge of precautions;Pain      OT Treatment/Interventions: Therapeutic exercise;Energy conservation;DME and/or AE instruction;Therapeutic activities;Patient/family education;Self-care/ADL training    OT Goals(Current goals can be found in the care plan section) Acute Rehab OT Goals Time For Goal Achievement: 07/10/18 Potential to Achieve Goals: Fair ADL Goals Pt Will Perform Eating: with set-up;bed level;sitting Pt/caregiver will Perform Home Exercise Program: Increased strength;Both right and left upper  extremity;With minimal assist Additional ADL Goal #1: Pt/caregivers will be independent with techniques and positioning to prevent skin breakdown.  OT Frequency: Min 1X/week   Barriers to D/C:            Co-evaluation              AM-PAC OT "6 Clicks" Daily Activity     Outcome Measure Help from another person eating meals?: A Little Help from another person taking care of personal grooming?: Total Help from another person toileting, which includes using toliet, bedpan, or urinal?: Total Help from another person bathing (including washing, rinsing, drying)?: Total Help from another person to put on and taking off regular upper body clothing?: Total Help from another person to put on and taking off regular lower body clothing?: Total 6 Click Score: 8   End of Session    Activity Tolerance: Patient limited by fatigue;Patient limited by pain Patient left: in bed;with call bell/phone within reach  OT Visit Diagnosis: Pain;Adult, failure to thrive (R62.7);Other symptoms and signs involving cognitive function;Muscle weakness (generalized) (M62.81)                Time: 1610-9604 OT Time Calculation (min): 12 min Charges:  OT General Charges $OT Visit: 1 Visit OT Evaluation $OT Eval Low Complexity: 1 Low  Raynald Kemp, OT Acute Rehabilitation Services Pager: [phone] Office: [phone]   Pilar Grammes 06/26/2018, 1:32 PM

## 2018-06-26 NOTE — Progress Notes (Signed)
Pt has continued to refuse to be turned or repositioned in the bed.  She has also refused to eat or drink anything except coffee today. I attempted to feed her chicken soup for lunch and a [place] sandwich and peaches for dinner pt refused to even take a bite.  Pt insists she has been moving in the bed when I am not in the room, however pillows and blankets remain in the same place.  Pt did accept her magic cup icecream and when I offered to open it for her, she told me no "I can't rush and eat"

## 2018-06-26 NOTE — Progress Notes (Signed)
OT Cancellation Note  Patient Details Name: Regina Sandoval MRN: [id number] DOB: [birth year]   Cancelled Treatment:    Reason Eval/Treat Not Completed: Patient at procedure or test/ unavailable. Pt is currently being assessed by a medical provider. Plan to reattempt.   Raynald Kemp, OT Acute Rehabilitation Services Pager: [phone] Office: [phone]  06/26/2018, 11:40 AM

## 2018-06-26 NOTE — Progress Notes (Addendum)
Initial Nutrition Assessment  DOCUMENTATION CODES:   Severe malnutrition in context of acute illness/injury  INTERVENTION:  Discussed the importance of nutrition for wound healing Encouraged PO intake    Magic cup TID with meals, each supplement provides 290 kcal and 9 grams of protein----Pt prefers strawberry.   MVI with minerals  Ensure Enlive po BID, each supplement provides 350 kcal and 20 grams of protein  Pt to receive snacks, starting 2/7 dinner snack. Pt reports liking peanut butter, chocolate, and crackers.   Downgrade diet to Dysphagia III  IF Pt continues to not eat via po, early next week (2/10) a Cortrak Feeding Tube should be considered.     NUTRITION DIAGNOSIS:   Severe Malnutrition related to acute illness(Infected decubitus ulcer) as evidenced by energy intake < or equal to 50% for > or equal to 5 days, severe fat depletion, severe muscle depletion(Upper Body Muscle depletion).   GOAL:   Patient will meet greater than or equal to 90% of their needs  MONITOR:   PO intake, Skin, Supplement acceptance  REASON FOR ASSESSMENT:   Consult, Malnutrition Screening Tool Wound healing(Sacral Decubitus ulceration)  ASSESSMENT:   Pt has PMH significant of dementia, HTN, A.Fib (Xarelto), chronic diastolic HF. Admitted from SNF (bedbound/wheelchair bound) for foul-smelling sacral decubitus ulceration and leukocytosis.    At first attempting to visit Pt, she was in Hydrotherapy to return afterwards. Spoke to RN in meantime and she states that she has dementia and has poor dentition and will probably need a swallow evaluation.   [facility] *Pt has dementia and was at times difficult to get answers, some answers received may be unreliable.   Pt reports of eating 2 meals a day. When she gets up she drinks coffee with milk and sometimes sugar, along with drinking some orange juice with her cup of coffee. Her next meal is later,  early afternoon and usually a bologna sandwich. She occasionally likes to snack on chips and cookies (Chocolate chip). When she watches t.v. she likes 'appetizers' (snacks). Pt can't recall weight or weight history but states that she has lost 10 lbs over the last week. Initially Mrs. Shon BatonBrooks states that she has a poor appetite and doesn't eat much, but then a couple questions later states she has a good appetite and is pretty hungry at meal times.  Pt would* be willing to try snacks or nutritional supplements. Prefers chocolate and/or peanut butter.    Since getting conflicting information, we called [facility] (SNF) to speak with her usual RN to get more information on diet/intake, mobility, and self-feeding. RN states that since her arrival Pt has been bed bound (3 months) and has not seen her mobil. Per RN pt was failure to thrive--she just stopped eating and drinking a few weeks ago. Available family were called in to identify foods of interest, such as hard boiled eggs and hamburgers. She began to eat a little more since diet was liberalized with preferred items, but only a few bites at meals. The foods she received were mechanical soft. RN notes there was treats and fruit at bedside, but only bites taken out of pieces, and she didn't have issues with eating.    Per RN on Jan 25th, 2020 noted stage II pressure injury described as dark wound slightly open with redness around the edge. Per RN pt was not eating/drinking and wound quickly escalated to current unstageable pressure injury.   Per nurse tech today pt must  be fed; pt continues to refuse eating/drinking when prompted.  Pt refused both Breakfast and Lunch today.   Medications reviewed and include:   30 ml Prostat BID, each supplement provides 100 kcal and 15 grams protein for wound healing     Note that the Pt is bed bound at SNF, noted in NFPE.   NUTRITION - FOCUSED PHYSICAL EXAM:    Most Recent Value  Orbital Region  Moderate  depletion  Upper Arm Region  Severe depletion  Thoracic and Lumbar Region  Severe depletion  Buccal Region  Severe depletion  Temple Region  Severe depletion  Clavicle Bone Region  Severe depletion  Clavicle and Acromion Bone Region  Severe depletion  Dorsal Hand  Moderate depletion  Patellar Region  Moderate depletion [Pt is Bedbound]  Anterior Thigh Region  Severe depletion [Pt is bedbound]  Posterior Calf Region  Severe depletion [Pt is bedbound]  Edema (RD Assessment)  None  Hair  Reviewed  Eyes  Reviewed  Mouth  Reviewed  Skin  Reviewed  Nails  Reviewed       Diet Order:   Diet Order            Diet Heart Room service appropriate? Yes; Fluid consistency: Thin  Diet effective now              EDUCATION NEEDS:   Education needs have been addressed  Skin:  Skin Assessment: Reviewed RN Assessment  Last BM:  2/5  Height:   Ht Readings from Last 1 Encounters:  06/24/18 5\' 7"  (1.702 m)    Weight:   Wt Readings from Last 1 Encounters:  07/04/2018 72 kg    Ideal Body Weight:  61.2 kg  BMI:  Body mass index is 24.86 kg/m.  Estimated Nutritional Needs:   Kcal:  1800-2100  Protein:  110-130 grams  Fluid:  >1.8L    Burnard Bunting, [facility] Dietetic Intern

## 2018-06-26 NOTE — NC FL2 (Signed)
[place] MEDICAID FL2 LEVEL OF CARE SCREENING TOOL     IDENTIFICATION  Patient Name: Regina Sandoval Birthdate: 05/23/1940 Sex: female Admission Date (Current Location): 07/17/2018  [facility] and [place] Number:  Producer, television/film/video and Address:  [facility] [place], [address], [place], [place] [address]      Provider Number: [id number]  Attending Physician Name and Address:  Maretta Bees, MD  Relative Name and Phone Number:       Current Level of Care: Hospital Recommended Level of Care: Skilled Nursing Facility Prior Approval Number:    Date Approved/Denied:   PASRR Number: [id number]  Discharge Plan: SNF    Current Diagnoses: Patient Active Problem List   Diagnosis Date Noted  . Infected decubitus ulcer, unspecified pressure ulcer stage 06/22/2018  . Gout 07/24/2017  . Persistent atrial fibrillation 06/18/2016  . CKD (chronic kidney disease) 06/18/2016  . Chronic diastolic heart failure (HCC) 06/18/2016  . COPD exacerbation (HCC)   . CAP (community acquired pneumonia) 10/24/2015  . HYPERCHOLESTEROLEMIA 05/15/2010  . Dementia without behavioral disturbance (HCC) 05/15/2010  . Essential hypertension 05/15/2010  . ARTHRITIS 05/15/2010  . 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 URINALYSIS, ABNORMAL 05/15/2010  . ALCOHOL ABUSE, HX OF 05/15/2010    Orientation RESPIRATION BLADDER Height & Weight     Self, Situation  Normal Incontinent Weight: 158 lb 11.7 oz (72 kg) Height:     BEHAVIORAL SYMPTOMS/MOOD NEUROLOGICAL BOWEL NUTRITION STATUS  (None) (Dementia) Continent Diet(Heart healthy)  AMBULATORY STATUS COMMUNICATION OF NEEDS Skin   Extensive Assist Verbally Other (Comment)(Excoriated. Unstageable pressure injury on sacrum: Abdominal pads, gauze, and santyl dailiy. Deep tissue injury on left heel: No dressing.)                       Personal Care Assistance Level of Assistance  Bathing, Feeding, Dressing Bathing Assistance:  Limited assistance Feeding assistance: Limited assistance Dressing Assistance: Limited assistance     Functional Limitations Info  Sight, Hearing, Speech Sight Info: Adequate Hearing Info: Adequate Speech Info: Adequate    SPECIAL CARE FACTORS FREQUENCY  PT (By licensed PT), Blood pressure, OT (By licensed OT)     PT Frequency: 5 x week OT Frequency: 5 x week            Contractures Contractures Info: Not present    Additional Factors Info  Code Status, Allergies Code Status Info: Full code Allergies Info: Penicillins           Current Medications (06/26/2018):  This is the current hospital active medication list Current Facility-Administered Medications  Medication Dose Route Frequency Provider Last Rate Last Dose  . 0.9 %  sodium chloride infusion   Intravenous Continuous Maretta Bees, MD 10 mL/hr at 06/26/18 1226 10 mL/hr at 06/26/18 1226  . acetaminophen (TYLENOL) tablet 650 mg  650 mg Oral Q6H PRN Maretta Bees, MD       Or  . acetaminophen (TYLENOL) suppository 650 mg  650 mg Rectal Q6H PRN Ghimire, Werner Lean, MD      . albuterol (PROVENTIL) (2.5 MG/3ML) 0.083% nebulizer solution 2.5 mg  2.5 mg Nebulization Q2H PRN Maretta Bees, MD      . allopurinol (ZYLOPRIM) tablet 300 mg  300 mg Oral Daily Maretta Bees, MD   300 mg at 06/26/18 0940  . atorvastatin (LIPITOR) tablet 40 mg  40 mg Oral q1800 Maretta Bees, MD   40 mg at 07/06/2018 1843  .  bisacodyl (DULCOLAX) EC tablet 5 mg  5 mg Oral Daily PRN Maretta Bees, MD      . cefTRIAXone (ROCEPHIN) 2 g in sodium chloride 0.9 % 100 mL IVPB  2 g Intravenous Q24H Maretta Bees, MD 200 mL/hr at 06/29/2018 2358 2 g at 07/07/2018 2358  . collagenase (SANTYL) ointment   Topical BID Maretta Bees, MD   1 application at 06/26/18 [phone]  . diltiazem (CARDIZEM) tablet 90 mg  90 mg Oral BID Maretta Bees, MD   90 mg at 06/26/18 5277  . donepezil (ARICEPT) tablet 10 mg  10 mg Oral QHS Maretta Bees, MD   10 mg at 07/04/2018 2335  . feeding supplement (PRO-STAT SUGAR FREE 64) liquid 30 mL  30 mL Oral BID Maretta Bees, MD   30 mL at 06/26/18 0939  . fluticasone (FLONASE) 50 MCG/ACT nasal spray 1 spray  1 spray Each Nare Daily PRN Ghimire, Werner Lean, MD      . furosemide (LASIX) tablet 80 mg  80 mg Oral Daily Ghimire, Shanker M, MD      . guaiFENesin (ROBITUSSIN) 100 MG/5ML solution 200 mg  10 mL Oral Q6H PRN Maretta Bees, MD      . heparin ADULT infusion 100 units/mL (25000 units/226mL sodium chloride 0.45%)  1,000 Units/hr Intravenous Continuous Maretta Bees, MD 10 mL/hr at 06/24/2018 1842 1,000 Units/hr at 07/01/2018 1842  . metoprolol succinate (TOPROL-XL) 24 hr tablet 200 mg  200 mg Oral Daily Maretta Bees, MD   200 mg at 06/26/18 0941  . metroNIDAZOLE (FLAGYL) IVPB 500 mg  500 mg Intravenous Q8H Maretta Bees, MD 100 mL/hr at 06/26/18 0947 500 mg at 06/26/18 0947  . multivitamin with minerals tablet 1 tablet  1 tablet Oral Daily Maretta Bees, MD   1 tablet at 06/26/18 0945  . ondansetron (ZOFRAN) tablet 4 mg  4 mg Oral Q6H PRN Ghimire, Werner Lean, MD       Or  . ondansetron (ZOFRAN) injection 4 mg  4 mg Intravenous Q6H PRN Ghimire, Werner Lean, MD      . oxyCODONE (Oxy IR/ROXICODONE) immediate release tablet 5 mg  5 mg Oral Q4H PRN Ghimire, Werner Lean, MD      . polyethylene glycol (MIRALAX / GLYCOLAX) packet 17 g  17 g Oral Daily Maretta Bees, MD   17 g at 06/26/18 0939  . polyvinyl alcohol (LIQUIFILM TEARS) 1.4 % ophthalmic solution 2 drop  2 drop Both Eyes BID Maretta Bees, MD   2 drop at 06/26/18 0946  . saccharomyces boulardii (FLORASTOR) capsule 250 mg  250 mg Oral BID Maretta Bees, MD   250 mg at 06/26/18 8242  . vancomycin (VANCOCIN) 1,250 mg in sodium chloride 0.9 % 250 mL IVPB  1,250 mg Intravenous Q24H Ghimire, Shanker M, MD      . vancomycin (VANCOCIN) 1,500 mg in sodium chloride 0.9 % 500 mL IVPB  1,500 mg Intravenous Once  Maretta Bees, MD      . Melene Muller ON 06/29/2018] Vitamin D (Ergocalciferol) (DRISDOL) capsule 50,000 Units  50,000 Units Oral Q30 days Ghimire, Werner Lean, MD         Discharge Medications: Please see discharge summary for a list of discharge medications.  Relevant Imaging Results:  Relevant Lab Results:   Additional Information SS#: [id number]  Margarito Liner, LCSW

## 2018-06-26 NOTE — Progress Notes (Signed)
ANTICOAGULATION CONSULT NOTE - Follow Up Consult  Pharmacy Consult for heparin Indication: atrial fibrillation  Labs: Recent Labs    06/24/18  06/23/2018 1341 06/24/2018 1430 06/29/2018 1451 06/26/18 0241  HGB 12.8  --  13.4  --  13.9 11.9*  HCT 40  --  42.0  --  41.0 37.5  PLT 180  --  211  --   --  168  APTT  --   --   --   --   --  90*  HEPARINUNFRC  --   --   --   --   --  1.42*  CREATININE  --    < >  --  0.95 0.80 0.68   < > = values in this interval not displayed.    Assessment/Plan:  78yo female therapeutic on heparin with initial dosing while Xarelto on hold. Will continue gtt at current rate and confirm stable with additional PTT.   Vernard Gambles, PharmD, BCPS  06/26/2018,3:53 AM

## 2018-06-26 NOTE — Progress Notes (Signed)
Physical Therapy Wound Treatment Patient Details  Name: Regina Sandoval MRN: [id number] Date of Birth: Oct 31, 1940  Today's Date: 06/26/2018 Time: 0930-1040 Time Calculation (min): 70 min  Subjective  Subjective: "I'm nervous." Patient and Family Stated Goals: none stated Date of Onset: (unknown) Prior Treatments: dressing change  Pain Score: Pt reporting "bottom" pain with transitioning/positioning; Pre medicated  Wound Assessment  Pressure Injury 06/26/18 Unstageable - Full thickness tissue loss in which the base of the ulcer is covered by slough (yellow, tan, gray, green or brown) and/or eschar (tan, brown or black) in the wound bed. (Active)  Wound Image   06/26/2018 10:00 AM  Dressing Type ABD;Gauze (Comment) 06/26/2018 10:00 AM  Dressing Changed;Dry;Clean;Intact 06/26/2018 10:00 AM  Dressing Change Frequency Daily 06/26/2018 10:00 AM  State of Healing Eschar 06/26/2018 10:00 AM  Site / Wound Assessment Black;Yellow;Red;Pink 06/26/2018 10:00 AM  % Wound base Red or Granulating 15% 06/26/2018 10:00 AM  % Wound base Yellow/Fibrinous Exudate 20% 06/26/2018 10:00 AM  % Wound base Black/Eschar 65% 06/26/2018 10:00 AM  % Wound base Other/Granulation Tissue (Comment) 0% 06/26/2018 10:00 AM  Peri-wound Assessment Erythema (blanchable);Pink 06/26/2018 10:00 AM  Wound Length (cm) 6 cm 06/26/2018 10:00 AM  Wound Width (cm) 7 cm 06/26/2018 10:00 AM  Wound Depth (cm) 4 cm 06/26/2018 10:00 AM  Wound Surface Area (cm^2) 42 cm^2 06/26/2018 10:00 AM  Wound Volume (cm^3) 168 cm^3 06/26/2018 10:00 AM  Undermining (cm) 4 cm from 12-6, 2 cm from 6-12 06/26/2018 10:00 AM  Margins Unattached edges (unapproximated) 06/26/2018 10:00 AM  Drainage Amount Moderate 06/26/2018 10:00 AM  Drainage Description Odor;Purulent;Serosanguineous 06/26/2018 10:00 AM  Treatment Debridement (Selective);Hydrotherapy (Pulse lavage);Packing (Saline gauze);Tape changed 06/26/2018 10:00 AM  Santyl applied to wound bed prior to applying  dressing.   Hydrotherapy Pulsed lavage therapy - wound location: sacrum Pulsed Lavage with Suction (psi): 8 psi(8-12) Pulsed Lavage with Suction - Normal Saline Used: 1000 mL Pulsed Lavage Tip: Tip with splash shield Selective Debridement Selective Debridement - Location: sacrum Selective Debridement - Tools Used: Forceps;Scalpel Selective Debridement - Tissue Removed: removal of eschar tissue, scored adherent eschar   Wound Assessment and Plan  Wound Therapy - Assess/Plan/Recommendations Wound Therapy - Clinical Statement: Patient presents to hydrotherapy with unstageable sacral injury. Will benefit from hydrotherapy for removal of necrotic tissue and to decrease bioburden. Wound Therapy - Functional Problem List: decr mobility Factors Delaying/Impairing Wound Healing: Immobility;Multiple medical problems;Incontinence Hydrotherapy Plan: Debridement;Dressing change;Patient/family education;Pulsatile lavage with suction Wound Therapy - Frequency: 6X / week Wound Therapy - Follow Up Recommendations: Skilled nursing facility Wound Plan: see above  Wound Therapy Goals- Improve the function of patient's integumentary system by progressing the wound(s) through the phases of wound healing (inflammation - proliferation - remodeling) by: Decrease Necrotic Tissue to: 70 Decrease Necrotic Tissue - Progress: Goal set today Increase Granulation Tissue to: 30 Increase Granulation Tissue - Progress: Goal set today Goals/treatment plan/discharge plan were made with and agreed upon by patient/family: Yes Time For Goal Achievement: 7 days Wound Therapy - Potential for Goals: Fair  Goals will be updated until maximal potential achieved or discharge criteria met.  Discharge criteria: when goals achieved, discharge from hospital, MD decision/surgical intervention, no progress towards goals, refusal/missing three consecutive treatments without notification or medical reason.  GP    Ellamae Sia, PT,  DPT Acute Rehabilitation Services Pager [phone] Office [phone]   Willy Eddy 06/26/2018, 11:07 AM

## 2018-06-26 NOTE — Progress Notes (Addendum)
PROGRESS NOTE        PATIENT DETAILS Name: Regina Sandoval Age: 78 y.o. Sex: female Date of Birth: 1941-05-10 Admit Date: 06/20/2018 Admitting Physician Dorcas CarrowKuber Ghimire, MD ZOX:[id number]:Hopper, Titus DubinWilliam F, MD  Brief Narrative: Patient is a 78 y.o. female with history of dementia, PAF, dyslipidemia, nonambulatory status-mostly bed to wheelchair bound-resident of SNF-presented with sepsis secondary to infected unstageable sacral decubitus ulcer.  Subjective: Lying comfortably in bed-denies any chest pain or shortness of breath.  Assessment/Plan: Sepsis secondary to infected sacral decubitus ulcer-unstageable: Afebrile-sepsis pathophysiology resolving-but continues to have significant leukocytosis.  Await culture data-in the meantime-continue with empiric vancomycin, Rocephin and Flagyl.  General surgery following-with recommendations to continue with hydrotherapy and to reassess in the next few days regarding need for surgical debridement.    Persistent atrial fibrillation: Rate controlled with Cardizem and metoprolol, holding Xarelto as the patient may require surgical debridement-on IV heparin.    Chronic diastolic heart failure: Volume status stable-stop all IV fluids-restart diuretics.  Follow weights, intake output periodically.  Dementia:  Stable-only minimally confused-continue with Aricept and Depakote.   Dyslipidemia: Continue statin  Hypertension: Continue Cardizem and metoprolol-follow and optimize accordingly  COPD: Continue as needed bronchodilators  Chronic debility: At baseline bed to wheelchair bound-apparently has not ambulated in the past several months.  She appears to have significant generalized weakness.  Await PT evaluation.  Severe malnutrition in context of acute illness/injury  DVT Prophylaxis: Full dose anticoagulation with Heparin  Code Status: Full code   Family Communication: None at bedside  Disposition Plan: Remain  inpatient-back to SNF-sometime next week  Antimicrobial agents: Anti-infectives (From admission, onward)   Start     Dose/Rate Route Frequency Ordered Stop   06/26/18 1800  vancomycin (VANCOCIN) 1,250 mg in sodium chloride 0.9 % 250 mL IVPB     1,250 mg 166.7 mL/hr over 90 Minutes Intravenous Every 24 hours 07/06/2018 1632     07/01/2018 2100  cefTRIAXone (ROCEPHIN) 2 g in sodium chloride 0.9 % 100 mL IVPB     2 g 200 mL/hr over 30 Minutes Intravenous Every 24 hours 07/14/2018 1753     07/04/2018 1800  metroNIDAZOLE (FLAGYL) IVPB 500 mg     500 mg 100 mL/hr over 60 Minutes Intravenous Every 8 hours 07/11/2018 1753     06/21/2018 1630  vancomycin (VANCOCIN) 1,500 mg in sodium chloride 0.9 % 500 mL IVPB     1,500 mg 250 mL/hr over 120 Minutes Intravenous  Once 07/15/2018 1617     07/08/2018 1515  vancomycin (VANCOCIN) IVPB 1000 mg/200 mL premix  Status:  Discontinued     1,000 mg 200 mL/hr over 60 Minutes Intravenous  Once 07/14/2018 1505 06/24/2018 1617   07/05/2018 1515  ceFEPIme (MAXIPIME) 2 g in sodium chloride 0.9 % 100 mL IVPB     2 g 200 mL/hr over 30 Minutes Intravenous  Once 07/13/2018 1505 06/26/2018 1637      Procedures: None  CONSULTS:  general surgery  Time spent: 25- minutes-Greater than 50% of this time was spent in counseling, explanation of diagnosis, planning of further management, and coordination of care.  MEDICATIONS: Scheduled Meds: . allopurinol  300 mg Oral Daily  . atorvastatin  40 mg Oral q1800  . collagenase   Topical BID  . diltiazem  90 mg Oral BID  . donepezil  10 mg Oral QHS  . feeding  supplement (PRO-STAT SUGAR FREE 64)  30 mL Oral BID  . metoprolol  200 mg Oral Daily  . multivitamin with minerals  1 tablet Oral Daily  . polyethylene glycol  17 g Oral Daily  . polyvinyl alcohol  2 drop Both Eyes BID  . saccharomyces boulardii  250 mg Oral BID  . [START ON 06/29/2018] Vitamin D (Ergocalciferol)  50,000 Units Oral Q30 days   Continuous Infusions: . sodium chloride  75 mL/hr at 07/12/2018 1908  . cefTRIAXone (ROCEPHIN)  IV 2 g (07/05/2018 2358)  . heparin 1,000 Units/hr (07/15/2018 1842)  . metronidazole 500 mg (06/26/18 0947)  . vancomycin    . vancomycin     PRN Meds:.acetaminophen **OR** acetaminophen, albuterol, bisacodyl, fluticasone, guaiFENesin, ondansetron **OR** ondansetron (ZOFRAN) IV, oxyCODONE   PHYSICAL EXAM: Vital signs: Vitals:   06/26/2018 1612 07/06/2018 1815 07/05/2018 2157 06/27/2018 2334  BP:  109/81 (!) 141/44 107/70  Pulse:  96 (!) 109   Resp: 16 17    Temp: 97.8 F (36.6 C) (!) 97.5 F (36.4 C) (!) 97.5 F (36.4 C)   TempSrc: Axillary Oral Oral   SpO2:  99% 99%   Weight:       Filed Weights   07/07/2018 1339  Weight: 72 kg   Body mass index is 24.86 kg/m.   General appearance :Awake, alert, not in any distress.  Chronically sick appearing HEENT: Atraumatic and Normocephalic Neck: supple Resp:Good air entry bilaterally, few scattered rhonchi CVS: S1 S2 regular GI: Bowel sounds present, Non tender and not distended with no gaurding, rigidity or rebound.No organomegaly Extremities: B/L Lower Ext shows no edema, both legs are warm to touch Skin:No Rash, warm and dry  I have personally reviewed following labs and imaging studies  LABORATORY DATA: CBC: Recent Labs  Lab 06/24/18 06/30/2018 1341 06/22/2018 1451 06/26/18 0241  WBC 24.2 27.8*  --  26.9*  NEUTROABS 22 26.1*  --   --   HGB 12.8 13.4 13.9 11.9*  HCT 40 42.0 41.0 37.5  MCV  --  93.8  --  91.5  PLT 180 211  --  168    Basic Metabolic Panel: Recent Labs  Lab 06/24/18 0237 06/24/18 0245 07/10/2018 1430 06/20/2018 1451 06/26/18 0241  NA 145 142 141 141 140  K 4.4 3.9 5.1 4.4 4.2  CL  --   --  108  --  108  CO2  --   --  19*  --  19*  GLUCOSE  --   --  131*  --  113*  BUN 40* 40* 40*  --  36*  CREATININE 0.8 0.8 0.95 0.80 0.68  CALCIUM  --   --  9.2  --  8.8*    GFR: Estimated Creatinine Clearance: 55.5 mL/min (by C-G formula based on SCr of 0.68  mg/dL).  Liver Function Tests: Recent Labs  Lab 07/08/2018 1430  AST 53*  ALT 52*  ALKPHOS 123  BILITOT 1.2  PROT 7.4  ALBUMIN 2.0*   No results for input(s): LIPASE, AMYLASE in the last 168 hours. No results for input(s): AMMONIA in the last 168 hours.  Coagulation Profile: No results for input(s): INR, PROTIME in the last 168 hours.  Cardiac Enzymes: No results for input(s): CKTOTAL, CKMB, CKMBINDEX, TROPONINI in the last 168 hours.  BNP (last 3 results) No results for input(s): PROBNP in the last 8760 hours.  HbA1C: No results for input(s): HGBA1C in the last 72 hours.  CBG: No results for input(s): GLUCAP in  the last 168 hours.  Lipid Profile: No results for input(s): CHOL, HDL, LDLCALC, TRIG, CHOLHDL, LDLDIRECT in the last 72 hours.  Thyroid Function Tests: Recent Labs    06/29/2018 1343  TSH 1.399    Anemia Panel: No results for input(s): VITAMINB12, FOLATE, FERRITIN, TIBC, IRON, RETICCTPCT in the last 72 hours.  Urine analysis: No results found for: COLORURINE, APPEARANCEUR, LABSPEC, PHURINE, GLUCOSEU, HGBUR, BILIRUBINUR, KETONESUR, PROTEINUR, UROBILINOGEN, NITRITE, LEUKOCYTESUR  Sepsis Labs: Lactic Acid, Venous    Component Value Date/Time   LATICACIDVEN 2.9 (HH) 06/28/2018 1341    MICROBIOLOGY: Recent Results (from the past 240 hour(s))  Blood culture (routine x 2)     Status: None (Preliminary result)   Collection Time: 07/05/2018  2:40 PM  Result Value Ref Range Status   Specimen Description BLOOD LEFT ANTECUBITAL  Final   Special Requests   Final    BOTTLES DRAWN AEROBIC AND ANAEROBIC Blood Culture adequate volume   Culture   Final    NO GROWTH < 24 HOURS Performed at [facility], [address] [place], [place] [address]    Report Status PENDING  Incomplete  Blood culture (routine x 2)     Status: None (Preliminary result)   Collection Time: 06/22/2018  7:50 PM  Result Value Ref Range Status   Specimen Description BLOOD LEFT WRIST   Final   Special Requests   Final    BOTTLES DRAWN AEROBIC AND ANAEROBIC Blood Culture results may not be optimal due to an inadequate volume of blood received in culture bottles   Culture   Final    NO GROWTH < 12 HOURS Performed at [facility], [address], [place], [place] [address]    Report Status PENDING  Incomplete    RADIOLOGY STUDIES/RESULTS: No results found.   LOS: 1 day   Jeoffrey Massed, MD  [facility]  If 7PM-7AM, please contact night-coverage  Please page via [email or web address]  Go to [email or web address] and use [place] [facility]'s universal password to access. If you do not have the password, please contact the hospital operator.  Locate the [facility] provider you are looking for under [facility] and page to a number that you can be directly reached. If you still have difficulty reaching the provider, please page the [facility] (Director on Call) for the Hospitalists listed on amion for assistance.  06/26/2018, 11:48 AM

## 2018-06-27 DIAGNOSIS — I10 Essential (primary) hypertension: Secondary | ICD-10-CM | POA: Diagnosis not present

## 2018-06-27 DIAGNOSIS — L8990 Pressure ulcer of unspecified site, unspecified stage: Secondary | ICD-10-CM | POA: Diagnosis not present

## 2018-06-27 DIAGNOSIS — I5032 Chronic diastolic (congestive) heart failure: Secondary | ICD-10-CM | POA: Diagnosis not present

## 2018-06-27 DIAGNOSIS — F0390 Unspecified dementia without behavioral disturbance: Secondary | ICD-10-CM | POA: Diagnosis not present

## 2018-06-27 LAB — HEPARIN LEVEL (UNFRACTIONATED)
HEPARIN UNFRACTIONATED: 0.83 [IU]/mL — AB (ref 0.30–0.70)
Heparin Unfractionated: 0.55 IU/mL (ref 0.30–0.70)

## 2018-06-27 LAB — CBC
HCT: 36.7 % (ref 36.0–46.0)
Hemoglobin: 11.5 g/dL — ABNORMAL LOW (ref 12.0–15.0)
MCH: 28.9 pg (ref 26.0–34.0)
MCHC: 31.3 g/dL (ref 30.0–36.0)
MCV: 92.2 fL (ref 80.0–100.0)
PLATELETS: 173 10*3/uL (ref 150–400)
RBC: 3.98 MIL/uL (ref 3.87–5.11)
RDW: 18.4 % — ABNORMAL HIGH (ref 11.5–15.5)
WBC: 26.1 10*3/uL — ABNORMAL HIGH (ref 4.0–10.5)
nRBC: 0.2 % (ref 0.0–0.2)

## 2018-06-27 LAB — BASIC METABOLIC PANEL
Anion gap: 15 (ref 5–15)
BUN: 35 mg/dL — ABNORMAL HIGH (ref 8–23)
CALCIUM: 8.7 mg/dL — AB (ref 8.9–10.3)
CO2: 21 mmol/L — ABNORMAL LOW (ref 22–32)
CREATININE: 1.05 mg/dL — AB (ref 0.44–1.00)
Chloride: 107 mmol/L (ref 98–111)
GFR calc Af Amer: 58 mL/min — ABNORMAL LOW (ref >60)
GFR, EST NON AFRICAN AMERICAN: 50 mL/min — AB (ref >60)
Glucose, Bld: 80 mg/dL (ref 70–99)
Potassium: 3.6 mmol/L (ref 3.5–5.1)
Sodium: 143 mmol/L (ref 135–145)

## 2018-06-27 LAB — APTT
aPTT: 107 seconds — ABNORMAL HIGH (ref 24–36)
aPTT: 72 seconds — ABNORMAL HIGH (ref 24–36)

## 2018-06-27 NOTE — Progress Notes (Signed)
ANTICOAGULATION CONSULT NOTE - Follow Up Consult  Pharmacy Consult for heparin Indication: atrial fibrillation  Allergies  Allergen Reactions  . Penicillins Hives    Did it involve swelling of the face/tongue/throat, SOB, or low BP? Unknown Did it involve sudden or severe rash/hives, skin peeling, or any reaction on the inside of your mouth or nose? Unknown Did you need to seek medical attention at a hospital or doctor's office? Unknown When did it last happen? Listed on [facility] If all above answers are "NO", may proceed with cephalosporin use.     Patient Measurements: Weight: 158 lb 11.7 oz (72 kg)  Vital Signs: Temp: 98.5 F (36.9 C) (02/08 1531) Temp Source: Oral (02/08 1531) BP: 96/64 (02/08 1531) Pulse Rate: 94 (02/08 1531)  Labs: Recent Labs    07/09/2018 1341  07/08/2018 1451 06/26/18 0241 06/27/18 0510 06/27/18 1546  HGB 13.4  --  13.9 11.9* 11.5*  --   HCT 42.0  --  41.0 37.5 36.7  --   PLT 211  --   --  168 173  --   APTT  --   --   --  90* 107* 72*  HEPARINUNFRC  --   --   --  1.42* 0.83* 0.55  CREATININE  --    < > 0.80 0.68 1.05*  --    < > = values in this interval not displayed.    Estimated Creatinine Clearance: 42.2 mL/min (A) (by C-G formula based on SCr of 1.05 mg/dL (H)).   Assessment: 78 y.o. F on heparin for afib while Xarelto on hold. Xarelto may still be slightly affecting heparin levels although heparin levels and PTT are close to correlating.  -heparin level= 0.55 and aPTT= 72    Goal of Therapy:  Heparin level 0.3-0.7 units/ml aPTT 66-102 seconds Monitor platelets by anticoagulation protocol: Yes   Plan:  -no heparin changes needed -Daily heparin level, aPTT and CBC  Harland German, PharmD Clinical Pharmacist **Pharmacist phone directory can now be found on [email or web address] ([email or web address]).  Listed under [facility].

## 2018-06-27 NOTE — Progress Notes (Signed)
ANTICOAGULATION CONSULT NOTE - Follow Up Consult  Pharmacy Consult for heparin Indication: atrial fibrillation  Allergies  Allergen Reactions  . Penicillins Hives    Did it involve swelling of the face/tongue/throat, SOB, or low BP? Unknown Did it involve sudden or severe rash/hives, skin peeling, or any reaction on the inside of your mouth or nose? Unknown Did you need to seek medical attention at a hospital or doctor's office? Unknown When did it last happen? Listed on [facility] If all above answers are "NO", may proceed with cephalosporin use.     Patient Measurements: Weight: 158 lb 11.7 oz (72 kg)  Vital Signs: Temp: 98.4 F (36.9 C) (02/08 0520) BP: 122/57 (02/08 0520) Pulse Rate: 99 (02/08 0520)  Labs: Recent Labs    07/07/2018 1341 07/04/2018 1430 07/12/2018 1451 06/26/18 0241 06/27/18 0510  HGB 13.4  --  13.9 11.9* 11.5*  HCT 42.0  --  41.0 37.5 36.7  PLT 211  --   --  168 173  APTT  --   --   --  90* 107*  HEPARINUNFRC  --   --   --  1.42* 0.83*  CREATININE  --  0.95 0.80 0.68  --     Estimated Creatinine Clearance: 55.5 mL/min (by C-G formula based on SCr of 0.68 mg/dL).   Assessment: 78 y.o. F on heparin for afib while Xarelto on hold. Xarelto may still be slightly affecting heparin levels although heparin levels and PTT are close to correlating.   PTT 107 sec (slightly supratherapeutic) on gtt at 1000 units/hr. Hgb down to 11.7, plt wnl.  Goal of Therapy:  Heparin level 0.3-0.7 units/ml aPTT 66-102 seconds Monitor platelets by anticoagulation protocol: Yes   Plan:  Decrease heparin gtt to 900 units/hr Will f/u 8 hr heparin level and PTT  Christoper Fabian, PharmD, BCPS Clinical pharmacist  **Pharmacist phone directory can now be found on [email or web address] ([email or web address]).  Listed under [facility]. 06/27/2018,5:57 AM

## 2018-06-27 NOTE — Progress Notes (Signed)
Wound dressing change completed at 2300. Patient pre-medicated. Significant discomfort with repositioning and turning. Patient care completed during dressing change as patient had soiled herself.   Offered to reposition patient during the night to increase comfort; patient refused and stated she was comfortable. Indicated she would communicate when she needed to be turned.

## 2018-06-27 NOTE — Evaluation (Signed)
SLP Cancellation Note  Patient Details Name: Regina Sandoval MRN: [id number] DOB: [birth year]   Cancelled treatment:       Reason Eval/Treat Not Completed: Patient declined, no reason specified;Pain limiting ability to participate. Patient c/o pain secondary to sacral decubitus ulcer and would not allow SLP to position her upright for safe PO intake. Reports also of patient refusing most PO's.    Angela Nevin, MA, CCC-SLP 06/27/18 1:38 PM

## 2018-06-27 NOTE — Plan of Care (Signed)
  Problem: Education: Goal: Knowledge of General Education information will improve Description Including pain rating scale, medication(s)/side effects and non-pharmacologic comfort measures Outcome: Progressing   Problem: Health Behavior/Discharge Planning: Goal: Ability to manage health-related needs will improve Outcome: Not Met (add Reason)   Problem: Clinical Measurements: Goal: Ability to maintain clinical measurements within normal limits will improve Outcome: Progressing Goal: Will remain free from infection Outcome: Not Met (add Reason) Goal: Diagnostic test results will improve Outcome: Progressing Goal: Respiratory complications will improve Outcome: Progressing Goal: Cardiovascular complication will be avoided Outcome: Progressing   Problem: Activity: Goal: Risk for activity intolerance will decrease Outcome: Not Progressing   Problem: Nutrition: Goal: Adequate nutrition will be maintained Outcome: Not Progressing   Problem: Coping: Goal: Level of anxiety will decrease Outcome: Not Progressing   Problem: Elimination: Goal: Will not experience complications related to bowel motility Outcome: Not Progressing Goal: Will not experience complications related to urinary retention Outcome: Not Applicable   Problem: Elimination: Goal: Will not experience complications related to bowel motility Outcome: Not Progressing Goal: Will not experience complications related to urinary retention Outcome: Not Applicable   Problem: Pain Managment: Goal: General experience of comfort will improve Outcome: Not Progressing   Problem: Safety: Goal: Ability to remain free from injury will improve Outcome: Adequate for Discharge   Problem: Skin Integrity: Goal: Risk for impaired skin integrity will decrease Outcome: Not Met (add Reason)

## 2018-06-27 NOTE — Progress Notes (Signed)
PROGRESS NOTE        PATIENT DETAILS Name: Regina Sandoval Age: 78 y.o. Sex: female Date of Birth: 1941-01-08 Admit Date: 02-Jul-2018 Admitting Physician Dorcas Carrow, MD [id number], Titus Dubin, MD  Brief Narrative: Patient is a 78 y.o. female with history of dementia, PAF, dyslipidemia, nonambulatory status-mostly bed to wheelchair bound-resident of SNF-presented with sepsis secondary to infected unstageable sacral decubitus ulcer.  Subjective: Lying comfortably-Per nursing staff-she has refused to be moved around.  No chest pain or shortness of breath.  Assessment/Plan: Sepsis secondary to infected sacral decubitus ulcer-unstageable: Afebrile but continues to have significant leukocytosis.  Blood cultures negative.  Continue empiric Rocephin, Flagyl and vancomycin.  Plans are to continue with hydrotherapy-general surgery contemplating debridement next week   Persistent atrial fibrillation: Rate controlled with Cardizem and metoprolol-Xarelto on hold as patient may require surgical debridement-continue IV heparin.    Chronic diastolic heart failure: Volume status stable, continue diuretics.  Follow electrolytes, volume status periodically.  Dementia:  Stable-minimally confused-suspect at baseline, continue with Aricept and Depakote.  .   Dyslipidemia: Continue statin  Hypertension: Continue Cardizem and metoprolol-follow and optimize accordingly  COPD:  Continue with as needed bronchodilators  Chronic debility: At baseline bed to wheelchair bound-apparently has not ambulated in the past several months.  She appears to have significant generalized weakness.  Await recommendations from rehab services.  Severe malnutrition in context of acute illness/injury  DVT Prophylaxis: Full dose anticoagulation with Heparin  Code Status: Full code   Family Communication: None at bedside  Disposition Plan: Remain inpatient-back to SNF-sometime next  week  Antimicrobial agents: Anti-infectives (From admission, onward)   Start     Dose/Rate Route Frequency Ordered Stop   06/26/18 1800  vancomycin (VANCOCIN) 1,250 mg in sodium chloride 0.9 % 250 mL IVPB     1,250 mg 166.7 mL/hr over 90 Minutes Intravenous Every 24 hours 02-Jul-2018 1632     July 02, 2018 2100  cefTRIAXone (ROCEPHIN) 2 g in sodium chloride 0.9 % 100 mL IVPB     2 g 200 mL/hr over 30 Minutes Intravenous Every 24 hours 07/04/2018 1753     06/30/2018 1800  metroNIDAZOLE (FLAGYL) IVPB 500 mg     500 mg 100 mL/hr over 60 Minutes Intravenous Every 8 hours 02-Jul-2018 1753     07/09/2018 1630  vancomycin (VANCOCIN) 1,500 mg in sodium chloride 0.9 % 500 mL IVPB     1,500 mg 250 mL/hr over 120 Minutes Intravenous  Once 07/14/2018 1617     06/26/2018 1515  vancomycin (VANCOCIN) IVPB 1000 mg/200 mL premix  Status:  Discontinued     1,000 mg 200 mL/hr over 60 Minutes Intravenous  Once 07/10/2018 1505 02-Jul-2018 1617   July 02, 2018 1515  ceFEPIme (MAXIPIME) 2 g in sodium chloride 0.9 % 100 mL IVPB     2 g 200 mL/hr over 30 Minutes Intravenous  Once 07/13/2018 1505 07/06/2018 1637      Procedures: None  CONSULTS:  general surgery  Time spent: 25- minutes-Greater than 50% of this time was spent in counseling, explanation of diagnosis, planning of further management, and coordination of care.  MEDICATIONS: Scheduled Meds: . allopurinol  300 mg Oral Daily  . atorvastatin  40 mg Oral q1800  . collagenase   Topical BID  . diltiazem  90 mg Oral BID  . donepezil  10 mg Oral QHS  . feeding supplement (  ENSURE ENLIVE)  237 mL Oral BID BM  . feeding supplement (PRO-STAT SUGAR FREE 64)  30 mL Oral BID  . furosemide  80 mg Oral Daily  . metoprolol  200 mg Oral Daily  . multivitamin with minerals  1 tablet Oral Daily  . polyethylene glycol  17 g Oral Daily  . polyvinyl alcohol  2 drop Both Eyes BID  . saccharomyces boulardii  250 mg Oral BID  . [START ON 06/29/2018] Vitamin D (Ergocalciferol)  50,000 Units  Oral Q30 days   Continuous Infusions: . sodium chloride 10 mL/hr (06/26/18 1226)  . cefTRIAXone (ROCEPHIN)  IV 2 g (06/27/18 0029)  . heparin 900 Units/hr (06/27/18 0818)  . metronidazole 500 mg (06/27/18 0944)  . vancomycin 1,250 mg (06/26/18 1806)  . vancomycin     PRN Meds:.acetaminophen **OR** acetaminophen, albuterol, bisacodyl, fluticasone, guaiFENesin, ondansetron **OR** ondansetron (ZOFRAN) IV, oxyCODONE   PHYSICAL EXAM: Vital signs: Vitals:   06/26/18 1402 06/26/18 2123 06/26/18 2155 06/27/18 0520  BP: 102/65 103/62  (!) 122/57  Pulse: 69  (!) 101 99  Resp: 14   18  Temp: (!) 97.5 F (36.4 C)  97.9 F (36.6 C) 98.4 F (36.9 C)  TempSrc: Oral     SpO2: 100%  94%   Weight:       Filed Weights   07/08/2018 1339  Weight: 72 kg   Body mass index is 24.86 kg/m.   General appearance:Awake, alert, not in any distress.  Eyes:no scleral icterus. Neck: supple, no JVD. Resp:Good air entry bilaterally, no rhonchi or rales anteriorly CVS: S1 S2 irregular  GI: Bowel sounds present, Non tender and not distended with no gaurding, rigidity or rebound. Extremities: B/L Lower Ext shows no edema, both legs are warm to touch Neurology: Poor exam as patient is uncooperative this morning but seems to move all 4 extremities-but has significant amount of general weakness at baseline. Musculoskeletal:No digital cyanosis Skin:No Rash, warm and dry Wounds:N/A  I have personally reviewed following labs and imaging studies  LABORATORY DATA: CBC: Recent Labs  Lab 06/24/18 07/07/2018 1341 07/12/2018 1451 06/26/18 0241 06/27/18 0510  WBC 24.2 27.8*  --  26.9* 26.1*  NEUTROABS 22 26.1*  --   --   --   HGB 12.8 13.4 13.9 11.9* 11.5*  HCT 40 42.0 41.0 37.5 36.7  MCV  --  93.8  --  91.5 92.2  PLT 180 211  --  168 173    Basic Metabolic Panel: Recent Labs  Lab 06/24/18 0237 06/24/18 0245 07/15/2018 1430 07/12/2018 1451 06/26/18 0241 06/27/18 0510  NA 145 142 141 141 140 143  K 4.4  3.9 5.1 4.4 4.2 3.6  CL  --   --  108  --  108 107  CO2  --   --  19*  --  19* 21*  GLUCOSE  --   --  131*  --  113* 80  BUN 40* 40* 40*  --  36* 35*  CREATININE 0.8 0.8 0.95 0.80 0.68 1.05*  CALCIUM  --   --  9.2  --  8.8* 8.7*    GFR: Estimated Creatinine Clearance: 42.2 mL/min (A) (by C-G formula based on SCr of 1.05 mg/dL (H)).  Liver Function Tests: Recent Labs  Lab 07/04/2018 1430  AST 53*  ALT 52*  ALKPHOS 123  BILITOT 1.2  PROT 7.4  ALBUMIN 2.0*   No results for input(s): LIPASE, AMYLASE in the last 168 hours. No results for input(s): AMMONIA in the  last 168 hours.  Coagulation Profile: No results for input(s): INR, PROTIME in the last 168 hours.  Cardiac Enzymes: No results for input(s): CKTOTAL, CKMB, CKMBINDEX, TROPONINI in the last 168 hours.  BNP (last 3 results) No results for input(s): PROBNP in the last 8760 hours.  HbA1C: No results for input(s): HGBA1C in the last 72 hours.  CBG: No results for input(s): GLUCAP in the last 168 hours.  Lipid Profile: No results for input(s): CHOL, HDL, LDLCALC, TRIG, CHOLHDL, LDLDIRECT in the last 72 hours.  Thyroid Function Tests: Recent Labs    06/30/2018 1343  TSH 1.399    Anemia Panel: No results for input(s): VITAMINB12, FOLATE, FERRITIN, TIBC, IRON, RETICCTPCT in the last 72 hours.  Urine analysis: No results found for: COLORURINE, APPEARANCEUR, LABSPEC, PHURINE, GLUCOSEU, HGBUR, BILIRUBINUR, KETONESUR, PROTEINUR, UROBILINOGEN, NITRITE, LEUKOCYTESUR  Sepsis Labs: Lactic Acid, Venous    Component Value Date/Time   LATICACIDVEN 2.9 (HH) 30-Jun-2018 1341    MICROBIOLOGY: Recent Results (from the past 240 hour(s))  Blood culture (routine x 2)     Status: None (Preliminary result)   Collection Time: 30-Jun-2018  2:40 PM  Result Value Ref Range Status   Specimen Description BLOOD LEFT ANTECUBITAL  Final   Special Requests   Final    BOTTLES DRAWN AEROBIC AND ANAEROBIC Blood Culture adequate volume    Culture   Final    NO GROWTH < 24 HOURS Performed at [facility], [address], [place], [place] [address]    Report Status PENDING  Incomplete  Blood culture (routine x 2)     Status: None (Preliminary result)   Collection Time: June 30, 2018  7:50 PM  Result Value Ref Range Status   Specimen Description BLOOD LEFT WRIST  Final   Special Requests   Final    BOTTLES DRAWN AEROBIC AND ANAEROBIC Blood Culture results may not be optimal due to an inadequate volume of blood received in culture bottles   Culture   Final    NO GROWTH < 24 HOURS Performed at [facility], [address] [place], [place] [address]    Report Status PENDING  Incomplete    RADIOLOGY STUDIES/RESULTS: No results found.   LOS: 2 days   Jeoffrey Massed, MD  [facility]  If 7PM-7AM, please contact night-coverage  Please page via [email or web address]  Go to [email or web address] and use [facility]'s universal password to access. If you do not have the password, please contact the hospital operator.  Locate the [facility] provider you are looking for under [facility] and page to a number that you can be directly reached. If you still have difficulty reaching the provider, please page the [facility] (Director on Call) for the Hospitalists listed on amion for assistance.  06/27/2018, 10:49 AM

## 2018-06-27 NOTE — Progress Notes (Signed)
Physical Therapy Wound Treatment Patient Details  Name: Regina Sandoval MRN: [id number] Date of Birth: January 05, 1941  Today's Date: 06/27/2018 Time: 1000-1045 Time Calculation (min): 45 min  Subjective  Subjective: "Wait a minute." Patient and Family Stated Goals: none stated Date of Onset: (unknown) Prior Treatments: dressing change  Pain Score: Pain Score: 8   Wound Assessment  Pressure Injury 06/26/18 Unstageable - Full thickness tissue loss in which the base of the ulcer is covered by slough (yellow, tan, gray, green or brown) and/or eschar (tan, brown or black) in the wound bed. (Active)  Dressing Type Moist to dry;ABD;Other (Comment) 06/27/2018 11:10 AM  Dressing Changed 06/27/2018 11:10 AM  Dressing Change Frequency Twice a day 06/27/2018 11:10 AM  State of Healing Eschar 06/27/2018 11:10 AM  Site / Wound Assessment Black;Yellow;Red;Pink;Brown 06/27/2018 11:10 AM  % Wound base Red or Granulating 10% 06/27/2018 11:10 AM  % Wound base Yellow/Fibrinous Exudate 10% 06/27/2018 11:10 AM  % Wound base Black/Eschar 80% 06/27/2018 11:10 AM  % Wound base Other/Granulation Tissue (Comment) 0% 06/27/2018 11:10 AM  Peri-wound Assessment Erythema (blanchable);Pink 06/27/2018 11:10 AM  Wound Length (cm) 6 cm 06/26/2018 10:00 AM  Wound Width (cm) 7 cm 06/26/2018 10:00 AM  Wound Depth (cm) 4 cm 06/26/2018 10:00 AM  Wound Surface Area (cm^2) 42 cm^2 06/26/2018 10:00 AM  Wound Volume (cm^3) 168 cm^3 06/26/2018 10:00 AM  Undermining (cm) 4 cm from 12-6, 2 cm from 6-12 06/26/2018 10:00 AM  Margins Unattached edges (unapproximated) 06/26/2018 10:00 AM  Drainage Amount Moderate 06/27/2018 11:10 AM  Drainage Description Odor;Purulent;Serosanguineous 06/27/2018 11:10 AM  Treatment Cleansed;Debridement (Selective);Hydrotherapy (Pulse lavage);Packing (Saline gauze);Tape changed 06/27/2018 11:10 AM     Santyl applied to wound bed prior to applying dressing.  Hydrotherapy Pulsed lavage therapy - wound location: sacrum Pulsed Lavage with Suction  (psi): 12 psi Pulsed Lavage with Suction - Normal Saline Used: 1000 mL Pulsed Lavage Tip: Tip with splash shield Selective Debridement Selective Debridement - Location: sacrum Selective Debridement - Tools Used: Forceps;Scalpel Selective Debridement - Tissue Removed: Removal of necrotic tissues with forceps and scalpal.  On Left side more adherent fibrinous exudate removed, on R side black to brown eschar with thick purulent drainage noted.     Wound Assessment and Plan  Wound Therapy - Assess/Plan/Recommendations Wound Therapy - Clinical Statement: Pt presents with odorous sacral decubitus ulcer.  Able to remove increased necrotic tissue from wound bed.  Pt remains to present with poor tolerance to tx.  Pt continues to benefit from hydro therapy to debride necrotic tissue and decrease bioburden.   Wound Therapy - Functional Problem List: decr mobility Factors Delaying/Impairing Wound Healing: Immobility;Multiple medical problems;Incontinence Hydrotherapy Plan: Debridement;Dressing change;Patient/family education;Pulsatile lavage with suction Wound Therapy - Frequency: 6X / week Wound Therapy - Follow Up Recommendations: Skilled nursing facility Wound Plan: see above  Wound Therapy Goals- Improve the function of patient's integumentary system by progressing the wound(s) through the phases of wound healing (inflammation - proliferation - remodeling) by: Decrease Necrotic Tissue to: 70 Decrease Necrotic Tissue - Progress: Progressing toward goal Increase Granulation Tissue to: 30 Increase Granulation Tissue - Progress: Progressing toward goal Goals/treatment plan/discharge plan were made with and agreed upon by patient/family: Yes Time For Goal Achievement: 7 days Wound Therapy - Potential for Goals: Fair  Goals will be updated until maximal potential achieved or discharge criteria met.  Discharge criteria: when goals achieved, discharge from hospital, MD decision/surgical intervention, no  progress towards goals, refusal/missing three consecutive treatments without notification or medical reason.  GP     Carolene Gitto Eli Hose 06/27/2018, 11:20 AM  Governor Rooks, PTA Acute Rehabilitation Services Pager [phone] Office [phone]

## 2018-06-28 ENCOUNTER — Other Ambulatory Visit: Payer: Self-pay

## 2018-06-28 DIAGNOSIS — I5032 Chronic diastolic (congestive) heart failure: Secondary | ICD-10-CM | POA: Diagnosis not present

## 2018-06-28 DIAGNOSIS — F0390 Unspecified dementia without behavioral disturbance: Secondary | ICD-10-CM | POA: Diagnosis not present

## 2018-06-28 DIAGNOSIS — E43 Unspecified severe protein-calorie malnutrition: Secondary | ICD-10-CM

## 2018-06-28 DIAGNOSIS — L8990 Pressure ulcer of unspecified site, unspecified stage: Secondary | ICD-10-CM | POA: Diagnosis not present

## 2018-06-28 DIAGNOSIS — I10 Essential (primary) hypertension: Secondary | ICD-10-CM | POA: Diagnosis not present

## 2018-06-28 LAB — CBC
HEMATOCRIT: 35.9 % — AB (ref 36.0–46.0)
Hemoglobin: 11.4 g/dL — ABNORMAL LOW (ref 12.0–15.0)
MCH: 28.8 pg (ref 26.0–34.0)
MCHC: 31.8 g/dL (ref 30.0–36.0)
MCV: 90.7 fL (ref 80.0–100.0)
Platelets: 176 10*3/uL (ref 150–400)
RBC: 3.96 MIL/uL (ref 3.87–5.11)
RDW: 18.3 % — ABNORMAL HIGH (ref 11.5–15.5)
WBC: 25.9 10*3/uL — ABNORMAL HIGH (ref 4.0–10.5)
nRBC: 0.2 % (ref 0.0–0.2)

## 2018-06-28 LAB — BASIC METABOLIC PANEL
Anion gap: 13 (ref 5–15)
BUN: 34 mg/dL — ABNORMAL HIGH (ref 8–23)
CHLORIDE: 108 mmol/L (ref 98–111)
CO2: 17 mmol/L — ABNORMAL LOW (ref 22–32)
Calcium: 8.4 mg/dL — ABNORMAL LOW (ref 8.9–10.3)
Creatinine, Ser: 0.96 mg/dL (ref 0.44–1.00)
GFR calc Af Amer: >60 mL/min (ref >60)
GFR calc non Af Amer: 56 mL/min — ABNORMAL LOW (ref >60)
Glucose, Bld: 85 mg/dL (ref 70–99)
Potassium: 3.6 mmol/L (ref 3.5–5.1)
Sodium: 138 mmol/L (ref 135–145)

## 2018-06-28 LAB — HEPARIN LEVEL (UNFRACTIONATED)
Heparin Unfractionated: 0.3 IU/mL (ref 0.30–0.70)
Heparin Unfractionated: 0.18 IU/mL — ABNORMAL LOW (ref 0.30–0.70)

## 2018-06-28 LAB — MRSA PCR SCREENING: MRSA by PCR: NEGATIVE

## 2018-06-28 LAB — APTT: aPTT: 67 seconds — ABNORMAL HIGH (ref 24–36)

## 2018-06-28 MED ORDER — VANCOMYCIN HCL IN DEXTROSE 1-5 GM/200ML-% IV SOLN
1000.0000 mg | INTRAVENOUS | Status: DC
  Administered 2018-06-28 – 2018-06-29 (×2): 1000 mg via INTRAVENOUS
  Filled 2018-06-28 (×3): qty 200

## 2018-06-28 MED ORDER — ORAL CARE MOUTH RINSE
15.0000 mL | Freq: Two times a day (BID) | OROMUCOSAL | Status: DC
  Administered 2018-06-28 – 2018-06-30 (×4): 15 mL via OROMUCOSAL

## 2018-06-28 NOTE — Evaluation (Addendum)
Clinical/Bedside Swallow Evaluation Patient Details  Name: Regina Sandoval MRN: [id number] Date of Birth: December 14, 1940  Today's Date: 06/28/2018 Time: SLP Start Time (ACUTE ONLY): 6644 SLP Stop Time (ACUTE ONLY): 0856 SLP Time Calculation (min) (ACUTE ONLY): 30 min  Past Medical History:  Past Medical History:  Diagnosis Date  . Alcohol dependence in remission (HCC)   . Arthritis   . Bradycardia   . Cataract   . Chronic anticoagulation    Xarelto  . Chronic diastolic heart failure (HCC)   . CKD (chronic kidney disease), stage III (HCC)   . CVA (cerebral infarction)   . Dementia (HCC)   . Edema   . Gout   . Hyperlipidemia   . Hypertension   . Murmur   . Osteoarthrosis, generalized, involving multiple sites    INVOLVING RIGHT HIP ,KNEE. AND ANKLE  . Persistent atrial fibrillation    a. Dx 04/2016, on anticoag, not felt to be a candidate for amiodarone or cardioversion.  . Seizure disorder (HCC)   . Subclinical hyperthyroidism   . Vitamin D deficiency    Past Surgical History: History reviewed. No pertinent surgical history. HPI:  78 y.o. female with history of dementia, HTN, seizure disorder, CVA, PAF, dyslipidemia, nonambulatory status-mostly bed to wheelchair bound-resident of SNF-presented with sepsis secondary to infected unstageable sacral decubitus ulcer   Assessment / Plan / Recommendation Clinical Impression  Pt in significant pain and requires max cues and encouragement thoroughout repositioing process due to large sacral ulcer. Lingual candidias present and pt has 2 teeth although masticated and propelled solid texture adequately. Consumed approximately 2 oz water via straw with throat clear and one delayed cough then another several oz without signifiant difficulty. Dementia/behavior places her at increased risk of aspiration. Recommend continue Dys 3 texture, thin liquids, crushed meds and full supervision, swallow precautions and f/u at SNF. SLP Visit Diagnosis: Dysphagia,  unspecified (R13.10)    Aspiration Risk  Mild aspiration risk;Moderate aspiration risk    Diet Recommendation Dysphagia 3 (Mech soft);Thin liquid   Liquid Administration via: Cup;Straw Medication Administration: Crushed with puree Supervision: Patient able to self feed;Full supervision/cueing for compensatory strategies Compensations: Minimize environmental distractions;Slow rate;Small sips/bites Postural Changes: Seated upright at 90 degrees    Other  Recommendations Oral Care Recommendations: Oral care QID   Follow up Recommendations Skilled Nursing facility      Frequency and Duration            Prognosis        Swallow Study   General HPI: 78 y.o. female with history of dementia, HTN, seizure disorder, CVA, PAF, dyslipidemia, nonambulatory status-mostly bed to wheelchair bound-resident of SNF-presented with sepsis secondary to infected unstageable sacral decubitus ulcer Previous Swallow Assessment: none found Diet Prior to this Study: Dysphagia 3 (soft);Thin liquids Temperature Spikes Noted: No Respiratory Status: Room air History of Recent Intubation: No Behavior/Cognition: Alert;Requires cueing Oral Cavity Assessment: Other (comment)(lingual candidia) Oral Care Completed by SLP: No(due to cooperation level) Oral Cavity - Dentition: Poor condition;Missing dentition Vision: Functional for self-feeding Self-Feeding Abilities: Able to feed self Patient Positioning: Upright in bed Baseline Vocal Quality: Normal Volitional Cough: Strong Volitional Swallow: Able to elicit    Oral/Motor/Sensory Function Overall Oral Motor/Sensory Function: Within functional limits   Ice Chips Ice chips: Not tested   Thin Liquid Thin Liquid: Impaired Presentation: Straw Pharyngeal  Phase Impairments: Throat Clearing - Immediate;Cough - Delayed    Nectar Thick Nectar Thick Liquid: Not tested   Honey Thick Honey Thick Liquid: Not tested  Puree Puree: Within functional limits    Solid     Solid: Within functional limits      Royce Macadamia 06/28/2018,9:14 AM  Breck Coons Lonell Face.Ed Nurse, children's [phone] Office [phone]

## 2018-06-28 NOTE — Progress Notes (Signed)
Patient's B/P in 80's/50's alerted Dr. Jerral Ralph, holding B/P meds and he will add parameters. Patient asymptomatic at this time, will continue to monitor her. She is refusing her dressing change at this time, will check back with her to see if she will allow me to change.

## 2018-06-28 NOTE — Progress Notes (Signed)
Drsg. changed per orders. Patient very reluctant to move even the slightest bit in bed. Patient repeatedly stating "wait" with any type of small touch by staff. Patient needed cueing repeatedly. Patient c/o severe generalized pain with slightest movement, even after pain medication given. When staff told patient that staff needed to help, patient pushed against staff. Patient was also bathed at this time since patient was incontinent. Patient repeatedly telling staff to "hurry", even though she wanted staff to wait for her to do everything herself. Patient can move feet but not moving legs at all. Patient eventually allowed three staff members to assist patient in rolling to her side, wash her back and bottom, and change dressing to sacrum. Once turned, patient tolerated fair. Patient allowed staff to position her to left side. Patient states that she is comfortable at this time. Will continue to monitor.

## 2018-06-28 NOTE — Progress Notes (Signed)
ANTICOAGULATION CONSULT NOTE - Follow Up Consult  Pharmacy Consult for heparin Indication: atrial fibrillation  Allergies  Allergen Reactions  . Penicillins Hives    Did it involve swelling of the face/tongue/throat, SOB, or low BP? Unknown Did it involve sudden or severe rash/hives, skin peeling, or any reaction on the inside of your mouth or nose? Unknown Did you need to seek medical attention at a hospital or doctor's office? Unknown When did it last happen? Listed on [facility] If all above answers are "NO", may proceed with cephalosporin use.     Patient Measurements: Height: 5\' 7"  (170.2 cm) Weight: 158 lb 11.7 oz (72 kg) IBW/kg (Calculated) : 61.6  Vital Signs: Temp: 97.7 F (36.5 C) (02/09 1350) Temp Source: Oral (02/09 1350) BP: 90/54 (02/09 1350) Pulse Rate: 93 (02/09 1350)  Labs: Recent Labs    06/26/18 0241 06/27/18 0510 06/27/18 1546 06/28/18 0404 06/28/18 1645  HGB 11.9* 11.5*  --  11.4*  --   HCT 37.5 36.7  --  35.9*  --   PLT 168 173  --  176  --   APTT 90* 107* 72* 67*  --   HEPARINUNFRC 1.42* 0.83* 0.55 0.30 0.18*  CREATININE 0.68 1.05*  --  0.96  --     Estimated Creatinine Clearance: 46.2 mL/min (by C-G formula based on SCr of 0.96 mg/dL).   Assessment: 78 y.o. F on heparin for afib while Xarelto on hold for possible I&D Heparin levels and aPTT now correlated and following heparin level goal.  Heparin level subtherapeutic at 0.18 s/p increase to 950 units/hr, RN confirms no issues with infusion.    Goal of Therapy:  Heparin level 0.3-0.7 units/ml Monitor platelets by anticoagulation protocol: Yes   Plan:  Increase heparin gtt to 1150 units/hr F/u 8 hour heparin level  Daylene Posey, PharmD Clinical Pharmacist Please check AMION for all [facility] numbers 06/28/2018 6:14 PM

## 2018-06-28 NOTE — Progress Notes (Signed)
PROGRESS NOTE        PATIENT DETAILS Name: Regina Sandoval Age: 78 y.o. Sex: female Date of Birth: 09-05-1940 Admit Date: 06/24/2018 Admitting Physician Dorcas CarrowKuber , MD ONG:[id number]:Hopper, Titus DubinWilliam F, MD  Brief Narrative: Patient is a 78 y.o. female with history of dementia, PAF, dyslipidemia, nonambulatory status-mostly bed to wheelchair bound-resident of SNF-presented with sepsis secondary to infected unstageable sacral decubitus ulcer.  Subjective: No major issues overnight-lying comfortably.  Assessment/Plan: Sepsis secondary to infected sacral decubitus ulcer-unstageable: Afebrile, leukocytosis slowly downtrending.  Blood cultures negative.  Continue empiric vancomycin, Rocephin and Flagyl.  General surgery following, plans are to continue hydrotherapy, IV antibiotics-and reassess to see if patient needs debridement in the next day or so.  Will await further recommendations from general surgery.    Persistent atrial fibrillation: Rate controlled with Cardizem and metoprolol.  Xarelto on hold-remains on IV heparin as patient may require surgical debridement over the next few days.  Chronic diastolic heart failure: Volume status stable-continue diuretics.  Follow electrolytes periodically.    Dementia:  Stable-minimally confused-suspect at baseline, continue with Aricept and Depakote.  .   Dyslipidemia: Continue statin  Hypertension: Controlled-continue Cardizem and metoprolol-follow and optimize accordingly.   COPD:  Continue with as needed bronchodilators  Chronic debility: At baseline bed to wheelchair bound-apparently has not ambulated in the past several months.  She appears to have significant generalized weakness.  Await recommendations from rehab services.  Severe malnutrition in context of acute illness/injury  DVT Prophylaxis: Full dose anticoagulation with Heparin  Code Status: Full code   Family Communication: None at bedside  Disposition  Plan: Remain inpatient-back to SNF-in the next few days.  Antimicrobial agents: Anti-infectives (From admission, onward)   Start     Dose/Rate Route Frequency Ordered Stop   06/26/18 1800  vancomycin (VANCOCIN) 1,250 mg in sodium chloride 0.9 % 250 mL IVPB     1,250 mg 166.7 mL/hr over 90 Minutes Intravenous Every 24 hours 07/06/2018 1632     07/04/2018 2100  cefTRIAXone (ROCEPHIN) 2 g in sodium chloride 0.9 % 100 mL IVPB     2 g 200 mL/hr over 30 Minutes Intravenous Every 24 hours 07/15/2018 1753     07/10/2018 1800  metroNIDAZOLE (FLAGYL) IVPB 500 mg     500 mg 100 mL/hr over 60 Minutes Intravenous Every 8 hours 07/05/2018 1753     06/26/2018 1630  vancomycin (VANCOCIN) 1,500 mg in sodium chloride 0.9 % 500 mL IVPB     1,500 mg 250 mL/hr over 120 Minutes Intravenous  Once 06/22/2018 1617     06/24/2018 1515  vancomycin (VANCOCIN) IVPB 1000 mg/200 mL premix  Status:  Discontinued     1,000 mg 200 mL/hr over 60 Minutes Intravenous  Once 06/22/2018 1505 07/09/2018 1617   07/13/2018 1515  ceFEPIme (MAXIPIME) 2 g in sodium chloride 0.9 % 100 mL IVPB     2 g 200 mL/hr over 30 Minutes Intravenous  Once 06/27/2018 1505 07/07/2018 1637      Procedures: None  CONSULTS:  general surgery  Time spent: 25- minutes-Greater than 50% of this time was spent in counseling, explanation of diagnosis, planning of further management, and coordination of care.  MEDICATIONS: Scheduled Meds: . allopurinol  300 mg Oral Daily  . atorvastatin  40 mg Oral q1800  . collagenase   Topical BID  . diltiazem  90 mg Oral  BID  . donepezil  10 mg Oral QHS  . feeding supplement (ENSURE ENLIVE)  237 mL Oral BID BM  . feeding supplement (PRO-STAT SUGAR FREE 64)  30 mL Oral BID  . furosemide  80 mg Oral Daily  . mouth rinse  15 mL Mouth Rinse BID  . metoprolol  200 mg Oral Daily  . multivitamin with minerals  1 tablet Oral Daily  . polyethylene glycol  17 g Oral Daily  . polyvinyl alcohol  2 drop Both Eyes BID  . saccharomyces  boulardii  250 mg Oral BID  . [START ON 06/29/2018] Vitamin D (Ergocalciferol)  50,000 Units Oral Q30 days   Continuous Infusions: . sodium chloride 10 mL/hr at 06/28/18 0700  . cefTRIAXone (ROCEPHIN)  IV Stopped (06/27/18 2337)  . heparin 950 Units/hr (06/28/18 0722)  . metronidazole Stopped (06/28/18 0323)  . vancomycin Stopped (06/27/18 2130)  . vancomycin     PRN Meds:.acetaminophen **OR** acetaminophen, albuterol, bisacodyl, fluticasone, guaiFENesin, ondansetron **OR** ondansetron (ZOFRAN) IV, oxyCODONE   PHYSICAL EXAM: Vital signs: Vitals:   06/27/18 1531 06/27/18 2300 06/27/18 2310 06/28/18 0619  BP: 96/64  96/66 (!) 109/52  Pulse: 94  95 (!) 104  Resp: 20  20 19   Temp: 98.5 F (36.9 C)  97.8 F (36.6 C) 97.7 F (36.5 C)  TempSrc: Oral  Oral   SpO2: 100%  100% 100%  Weight:      Height:  5\' 7"  (1.702 m)     Filed Weights   07/17/2018 1339  Weight: 72 kg   Body mass index is 24.86 kg/m.   General appearance:Awake, alert, not in any distress.  Eyes:no scleral icterus. HEENT: Atraumatic and Normocephalic Neck: supple, no JVD. Resp:Good air entry bilaterally,no rales or rhonchi CVS: S1 S2 regular GI: Bowel sounds present, Non tender and not distended with no gaurding, rigidity or rebound. Extremities: B/L Lower Ext shows no edema, both legs are warm to touch Neurology:  Non focal-with severe generalized weakness. Psychiatric: Normal judgment and insight. Normal mood. Musculoskeletal:No digital cyanosis Skin:No Rash, warm and dry  I have personally reviewed following labs and imaging studies  LABORATORY DATA: CBC: Recent Labs  Lab 06/24/18 06/26/2018 1341 06/30/2018 1451 06/26/18 0241 06/27/18 0510 06/28/18 0404  WBC 24.2 27.8*  --  26.9* 26.1* 25.9*  NEUTROABS 22 26.1*  --   --   --   --   HGB 12.8 13.4 13.9 11.9* 11.5* 11.4*  HCT 40 42.0 41.0 37.5 36.7 35.9*  MCV  --  93.8  --  91.5 92.2 90.7  PLT 180 211  --  168 173 176    Basic Metabolic  Panel: Recent Labs  Lab 06/24/18 0245 06/21/2018 1430 07/06/2018 1451 06/26/18 0241 06/27/18 0510 06/28/18 0404  NA 142 141 141 140 143 138  K 3.9 5.1 4.4 4.2 3.6 3.6  CL  --  108  --  108 107 108  CO2  --  19*  --  19* 21* 17*  GLUCOSE  --  131*  --  113* 80 85  BUN 40* 40*  --  36* 35* 34*  CREATININE 0.8 0.95 0.80 0.68 1.05* 0.96  CALCIUM  --  9.2  --  8.8* 8.7* 8.4*    GFR: Estimated Creatinine Clearance: 46.2 mL/min (by C-G formula based on SCr of 0.96 mg/dL).  Liver Function Tests: Recent Labs  Lab 06/28/2018 1430  AST 53*  ALT 52*  ALKPHOS 123  BILITOT 1.2  PROT 7.4  ALBUMIN 2.0*  No results for input(s): LIPASE, AMYLASE in the last 168 hours. No results for input(s): AMMONIA in the last 168 hours.  Coagulation Profile: No results for input(s): INR, PROTIME in the last 168 hours.  Cardiac Enzymes: No results for input(s): CKTOTAL, CKMB, CKMBINDEX, TROPONINI in the last 168 hours.  BNP (last 3 results) No results for input(s): PROBNP in the last 8760 hours.  HbA1C: No results for input(s): HGBA1C in the last 72 hours.  CBG: No results for input(s): GLUCAP in the last 168 hours.  Lipid Profile: No results for input(s): CHOL, HDL, LDLCALC, TRIG, CHOLHDL, LDLDIRECT in the last 72 hours.  Thyroid Function Tests: Recent Labs    Jan 14, 2019 1343  TSH 1.399    Anemia Panel: No results for input(s): VITAMINB12, FOLATE, FERRITIN, TIBC, IRON, RETICCTPCT in the last 72 hours.  Urine analysis: No results found for: COLORURINE, APPEARANCEUR, LABSPEC, PHURINE, GLUCOSEU, HGBUR, BILIRUBINUR, KETONESUR, PROTEINUR, UROBILINOGEN, NITRITE, LEUKOCYTESUR  Sepsis Labs: Lactic Acid, Venous    Component Value Date/Time   LATICACIDVEN 2.9 (HH) Aug 27, 202020 1341    MICROBIOLOGY: Recent Results (from the past 240 hour(s))  Blood culture (routine x 2)     Status: None (Preliminary result)   Collection Time: Jan 14, 2019  2:40 PM  Result Value Ref Range Status   Specimen  Description BLOOD LEFT ANTECUBITAL  Final   Special Requests   Final    BOTTLES DRAWN AEROBIC AND ANAEROBIC Blood Culture adequate volume   Culture   Final    NO GROWTH 2 DAYS Performed at [facility], [address] [place], [place] [address]    Report Status PENDING  Incomplete  Blood culture (routine x 2)     Status: None (Preliminary result)   Collection Time: Jan 14, 2019  7:50 PM  Result Value Ref Range Status   Specimen Description BLOOD LEFT WRIST  Final   Special Requests   Final    BOTTLES DRAWN AEROBIC AND ANAEROBIC Blood Culture results may not be optimal due to an inadequate volume of blood received in culture bottles   Culture   Final    NO GROWTH 2 DAYS Performed at [facility], [address] [place], [place] [address]    Report Status PENDING  Incomplete  MRSA PCR Screening     Status: None   Collection Time: 06/27/18 11:12 PM  Result Value Ref Range Status   MRSA by PCR NEGATIVE NEGATIVE Final    Comment:        The GeneXpert MRSA Assay (FDA approved for NASAL specimens only), is one component of a comprehensive MRSA colonization surveillance program. It is not intended to diagnose MRSA infection nor to guide or monitor treatment for MRSA infections. Performed at [facility], [address] [place], [place] [address]     RADIOLOGY STUDIES/RESULTS: No results found.   LOS: 3 days   Jeoffrey MassedShanker , MD  [facility]  If 7PM-7AM, please contact night-coverage  Please page via [email or web address]  Go to [email or web address] and use [facility] universal password to access. If you do not have the password, please contact the hospital operator.  Locate the [facility] provider you are looking for under [facility] and page to a number that you can be directly reached. If you still have difficulty reaching the provider, please page the [facility] (Director on Call) for the Hospitalists listed on amion for assistance.  06/28/2018, 10:28 AM

## 2018-06-28 NOTE — Progress Notes (Signed)
Patient had very poor PO intake through the day. A little incontinent did check bladder scan revealed only 39ml.  Urine in canister from purewick tea colored. Will continue to monitor.

## 2018-06-28 NOTE — Progress Notes (Signed)
ANTICOAGULATION CONSULT NOTE - Follow Up Consult  Pharmacy Consult for heparin Indication: atrial fibrillation  Allergies  Allergen Reactions  . Penicillins Hives    Did it involve swelling of the face/tongue/throat, SOB, or low BP? Unknown Did it involve sudden or severe rash/hives, skin peeling, or any reaction on the inside of your mouth or nose? Unknown Did you need to seek medical attention at a hospital or doctor's office? Unknown When did it last happen? Listed on [facility] If all above answers are "NO", may proceed with cephalosporin use.     Patient Measurements: Height: 5\' 7"  (170.2 cm) Weight: 158 lb 11.7 oz (72 kg) IBW/kg (Calculated) : 61.6  Vital Signs: Temp: 97.7 F (36.5 C) (02/09 0619) Temp Source: Oral (02/08 2310) BP: 109/52 (02/09 0619) Pulse Rate: 104 (02/09 0619)  Labs: Recent Labs    06/26/18 0241 06/27/18 0510 06/27/18 1546 06/28/18 0404  HGB 11.9* 11.5*  --  11.4*  HCT 37.5 36.7  --  35.9*  PLT 168 173  --  176  APTT 90* 107* 72* 67*  HEPARINUNFRC 1.42* 0.83* 0.55 0.30  CREATININE 0.68 1.05*  --  0.96    Estimated Creatinine Clearance: 46.2 mL/min (by C-G formula based on SCr of 0.96 mg/dL).   Assessment: 78 y.o. F on heparin for afib while Xarelto on hold for possible I&D  Heparin level= 0.3 and aPTT= 67 (borderline therapeutic) CBC stable  Goal of Therapy:  Heparin level 0.3-0.7 units/ml aPTT 66-102 seconds Monitor platelets by anticoagulation protocol: Yes   Plan:  -Increase heparin drip slightly to 950 units/hr to prevent going subtherapeutic -Check 8 hour heparin level -Will discontinue aPTT monitoring since levels now seem to be correlating -Daily heparin level, and CBC  Danae Orleans, PharmD PGY1 Pharmacy Resident Phone [phone] 06/28/2018       7:12 AM

## 2018-06-28 NOTE — Progress Notes (Signed)
Pharmacy Antibiotic Note  Regina Sandoval is a 78 y.o. female admitted on 06/24/2018 with wound infection.  Pharmacy has been consulted for vancomycin dosing.  Presented with foul-smelling decubitus ulcer. WBC remains elevated at 25.9, afebrile, SCr 0.8>>0.96. PCN allergy listed- has tolerated cephalosporins previously. She did not receive the 2000 mg loading dose on 2/6.  Plan: Reduce vancomycin to 1000 mg IV Q 24 hrs with bump in SCr Goal AUC 400-550 Expected AUC: 451.2 SCr used: 0.96  Monitor renal fx, clinical pic, cx results, and levels as appropriate  Height: 5\' 7"  (170.2 cm) Weight: 158 lb 11.7 oz (72 kg) IBW/kg (Calculated) : 61.6  Temp (24hrs), Avg:98 F (36.7 C), Min:97.7 F (36.5 C), Max:98.5 F (36.9 C)  Recent Labs  Lab 06/24/18  06/24/2018 1341 06/24/2018 1430 07/11/2018 1451 06/26/18 0241 06/27/18 0510 06/28/18 0404  WBC 24.2  --  27.8*  --   --  26.9* 26.1* 25.9*  CREATININE  --    < >  --  0.95 0.80 0.68 1.05* 0.96  LATICACIDVEN  --   --  2.9*  --   --   --   --   --    < > = values in this interval not displayed.    Estimated Creatinine Clearance: 46.2 mL/min (by C-G formula based on SCr of 0.96 mg/dL).    Allergies  Allergen Reactions  . Penicillins Hives    Did it involve swelling of the face/tongue/throat, SOB, or low BP? Unknown Did it involve sudden or severe rash/hives, skin peeling, or any reaction on the inside of your mouth or nose? Unknown Did you need to seek medical attention at a hospital or doctor's office? Unknown When did it last happen? Listed on [facility] If all above answers are "NO", may proceed with cephalosporin use.     Antimicrobials this admission: Cefepime 2/6>> Vancomycin 2/6>> Metronidazole 2/6 >>  Dose adjustments this admission: N/A  Microbiology results: 2/6 BCx: ngtd  Thank you for allowing pharmacy to be a part of this patient's care.  Danae Orleans, PharmD PGY1 Pharmacy Resident Phone [phone] 06/28/2018        1:52 PM

## 2018-06-28 NOTE — Progress Notes (Signed)
Bodenheimer, NP notified of BP of 96/66. Cardizem held per Rana SnareBodenheimer, NP. Patient asymptomatic. Will continue to monitor.

## 2018-06-29 DIAGNOSIS — F0390 Unspecified dementia without behavioral disturbance: Secondary | ICD-10-CM | POA: Diagnosis not present

## 2018-06-29 DIAGNOSIS — I5032 Chronic diastolic (congestive) heart failure: Secondary | ICD-10-CM | POA: Diagnosis not present

## 2018-06-29 DIAGNOSIS — L8990 Pressure ulcer of unspecified site, unspecified stage: Secondary | ICD-10-CM | POA: Diagnosis not present

## 2018-06-29 DIAGNOSIS — I10 Essential (primary) hypertension: Secondary | ICD-10-CM | POA: Diagnosis not present

## 2018-06-29 LAB — CBC
HCT: 34 % — ABNORMAL LOW (ref 36.0–46.0)
Hemoglobin: 10.8 g/dL — ABNORMAL LOW (ref 12.0–15.0)
MCH: 28.6 pg (ref 26.0–34.0)
MCHC: 31.8 g/dL (ref 30.0–36.0)
MCV: 89.9 fL (ref 80.0–100.0)
NRBC: 0.6 % — AB (ref 0.0–0.2)
PLATELETS: 183 10*3/uL (ref 150–400)
RBC: 3.78 MIL/uL — ABNORMAL LOW (ref 3.87–5.11)
RDW: 18.1 % — ABNORMAL HIGH (ref 11.5–15.5)
WBC: 23.3 10*3/uL — ABNORMAL HIGH (ref 4.0–10.5)

## 2018-06-29 LAB — HEPARIN LEVEL (UNFRACTIONATED)
HEPARIN UNFRACTIONATED: 0.22 [IU]/mL — AB (ref 0.30–0.70)
Heparin Unfractionated: 0.27 IU/mL — ABNORMAL LOW (ref 0.30–0.70)
Heparin Unfractionated: <0.1 IU/mL — ABNORMAL LOW (ref 0.30–0.70)

## 2018-06-29 LAB — BASIC METABOLIC PANEL
Anion gap: 12 (ref 5–15)
BUN: 40 mg/dL — ABNORMAL HIGH (ref 8–23)
CO2: 19 mmol/L — ABNORMAL LOW (ref 22–32)
Calcium: 8.4 mg/dL — ABNORMAL LOW (ref 8.9–10.3)
Chloride: 108 mmol/L (ref 98–111)
Creatinine, Ser: 1.08 mg/dL — ABNORMAL HIGH (ref 0.44–1.00)
GFR calc Af Amer: 57 mL/min — ABNORMAL LOW (ref >60)
GFR calc non Af Amer: 49 mL/min — ABNORMAL LOW (ref >60)
GLUCOSE: 96 mg/dL (ref 70–99)
Potassium: 3.3 mmol/L — ABNORMAL LOW (ref 3.5–5.1)
Sodium: 139 mmol/L (ref 135–145)

## 2018-06-29 MED ORDER — POTASSIUM CHLORIDE CRYS ER 20 MEQ PO TBCR
40.0000 meq | EXTENDED_RELEASE_TABLET | Freq: Once | ORAL | Status: AC
  Administered 2018-06-29: 40 meq via ORAL
  Filled 2018-06-29: qty 2

## 2018-06-29 NOTE — Evaluation (Signed)
Physical Therapy Evaluation Patient Details Name: Regina Sandoval MRN: [id number] DOB: 30-Mar-1941 Today's Date: 06/29/2018   History of Present Illness  78 y.o. female with history of dementia, PAF, dyslipidemia, nonambulatory status-mostly bed to wheelchair bound-resident of SNF-presented with sepsis secondary to infected unstageable sacral decubitus ulcer.  Clinical Impression   Patient received in bed, very dismayed to see therapist and stating "why won't anyone leave me alone- I just lay quietly in my bed and don't bother anyone!". Able to convince patient to participate in short PT session today, however only able to perform rolling as she declined other activities and began to become agitated with PT, shaking fist and asking again why she can't "...just be left alone..". Able to initiate rolling with significant amount of extended time, however required MaxA to complete rolling. TotalA for lifting legs up for repositioning/re-donning of soft heel boots. Refused repositioning in the bed. She was left supine in bed with all needs met, bed alarm active and positioned to comfort this afternoon. She generally appears to be at her baseline level of function and has very low motivated to participate in PT services. Recommend return to SNF facility when cleared by medical team, otherwise PT signing off from mobility perspective due to generally being at baseline level of function and due to low likelihood of participation in therapy- note, however patient will continue to receive skilled wound care services from therapy staff (wound order has not been signed off on/completed and remains active!).     Follow Up Recommendations SNF    Equipment Recommendations  Other (comment)(defer to next venue )    Recommendations for Other Services       Precautions / Restrictions Precautions Precautions: Fall Restrictions Weight Bearing Restrictions: No      Mobility  Bed Mobility Overal bed mobility: Needs  Assistance Bed Mobility: Rolling Rolling: Max assist         General bed mobility comments: able to initiate roll with extended time and effort, required MaxA to complete rolling in bed; refused attempting further mobility, stating "why don't you people just leave me alone" and shaking fist at PT   Transfers                 General transfer comment: patient refused to attempt/dependent on lift at baseline   Ambulation/Gait             General Gait Details: N/A- dependent on lift at baseline   Stairs            Wheelchair Mobility    Modified Rankin (Stroke Patients Only)       Balance                                             Pertinent Vitals/Pain Pain Assessment: Faces Pain Score: 0-No pain Faces Pain Scale: No hurt    Home Living Family/patient expects to be discharged to:: Skilled nursing facility                      Prior Function Level of Independence: Needs assistance   Gait / Transfers Assistance Needed: mostly bed bound, dependent for transfers  ADL's / Homemaking Assistance Needed: per contact at [facility] pt was total care with bathing/dressing, able to self-fed, dependent for transfers         Hand Dominance  Extremity/Trunk Assessment   Upper Extremity Assessment Upper Extremity Assessment: Generalized weakness    Lower Extremity Assessment Lower Extremity Assessment: Generalized weakness    Cervical / Trunk Assessment Cervical / Trunk Assessment: Kyphotic  Communication   Communication: HOH  Cognition Arousal/Alertness: Awake/alert Behavior During Therapy: WFL for tasks assessed/performed Overall Cognitive Status: History of cognitive impairments - at baseline                                        General Comments General comments (skin integrity, edema, etc.): unable to assess balance- due to dependent PLOF/status, assuming zero-poor sitting and standing  balance however     Exercises     Assessment/Plan    PT Assessment Patent does not need any further PT services  PT Problem List         PT Treatment Interventions      PT Goals (Current goals can be found in the Care Plan section)  Acute Rehab PT Goals Patient Stated Goal: "to be left alone!!"  PT Goal Formulation: With patient Time For Goal Achievement: 07/13/18 Potential to Achieve Goals: Fair    Frequency     Barriers to discharge        Co-evaluation               AM-PAC PT "6 Clicks" Mobility  Outcome Measure Help needed turning from your back to your side while in a flat bed without using bedrails?: A Lot Help needed moving from lying on your back to sitting on the side of a flat bed without using bedrails?: Total Help needed moving to and from a bed to a chair (including a wheelchair)?: Total Help needed standing up from a chair using your arms (e.g., wheelchair or bedside chair)?: Total Help needed to walk in hospital room?: Total Help needed climbing 3-5 steps with a railing? : Total 6 Click Score: 7    End of Session Equipment Utilized During Treatment: Oxygen Activity Tolerance: Patient tolerated treatment well Patient left: in bed;with bed alarm set;with call bell/phone within reach   PT Visit Diagnosis: Muscle weakness (generalized) (M62.81)    Time: 1400-1420 PT Time Calculation (min) (ACUTE ONLY): 20 min   Charges:   PT Evaluation $PT Eval Low Complexity: 1 Low         Deniece Ree PT, DPT, CBIS  Supplemental Physical Therapist [place]    Pager [phone] Acute Rehab Office [phone]

## 2018-06-29 NOTE — Progress Notes (Signed)
Physical Therapy Wound Treatment Patient Details  Name: Shalayne Leach MRN: [id number] Date of Birth: [birth year]  Today's Date: 06/29/2018 Time: 1013-1056 Time Calculation (min): 43 min  Subjective  Subjective: "I just want some time to relax." Patient and Family Stated Goals: none stated Date of Onset: (unknown) Prior Treatments: dressing change  Pain Score:  Premedicated. Pain with rolling but no c/o's during hydrotherapy or debridement.  Wound Assessment  Pressure Injury 06/26/18 Unstageable - Full thickness tissue loss in which the base of the ulcer is covered by slough (yellow, tan, gray, green or brown) and/or eschar (tan, brown or black) in the wound bed. Sacrum (Active)  Dressing Type Moist to dry;ABD;Other (Comment);Barrier Film (skin prep) 06/29/2018 11:21 AM  Dressing Changed;Clean;Dry;Intact 06/29/2018 11:21 AM  Dressing Change Frequency Twice a day 06/29/2018 11:21 AM  State of Healing Eschar 06/29/2018 11:21 AM  Site / Wound Assessment Black;Yellow;Red;Pink;Brown 06/29/2018 11:21 AM  % Wound base Red or Granulating 10% 06/29/2018 11:21 AM  % Wound base Yellow/Fibrinous Exudate 10% 06/29/2018 11:21 AM  % Wound base Black/Eschar 80% 06/29/2018 11:21 AM  % Wound base Other/Granulation Tissue (Comment) 0% 06/29/2018 11:21 AM  Peri-wound Assessment Erythema (blanchable);Pink 06/29/2018 11:21 AM  Wound Length (cm) 6 cm 06/27/2018  9:30 AM  Wound Width (cm) 7 cm 06/27/2018  9:30 AM  Wound Depth (cm) 4 cm 06/27/2018  9:30 AM  Wound Surface Area (cm^2) 42 cm^2 06/27/2018  9:30 AM  Wound Volume (cm^3) 168 cm^3 06/27/2018  9:30 AM  Undermining (cm) 4 cm from 12-6, 2 cm from 6-12 06/26/2018 10:00 AM  Margins Unattached edges (unapproximated) 06/29/2018 11:21 AM  Drainage Amount Moderate 06/29/2018 11:21 AM  Drainage Description Odor;Purulent 06/29/2018 11:21 AM  Treatment Debridement (Selective);Hydrotherapy (Pulse lavage);Packing (Saline gauze) 06/29/2018 11:21 AM   Santyl applied to wound bed prior to  applying dressing.    Hydrotherapy Pulsed lavage therapy - wound location: sacrum Pulsed Lavage with Suction (psi): 12 psi Pulsed Lavage with Suction - Normal Saline Used: 1000 mL Pulsed Lavage Tip: Tip with splash shield Selective Debridement Selective Debridement - Location: sacrum Selective Debridement - Tools Used: Forceps;Scalpel;Scissors Selective Debridement - Tissue Removed: Black, brown and yellow necrotic tissue   Wound Assessment and Plan  Wound Therapy - Assess/Plan/Recommendations Wound Therapy - Clinical Statement: Continue with significant removal of necrotic tissue. Continue hydrotherapy for removal of necrotic tissue and reduction of bioburden. Wound Therapy - Functional Problem List: decr mobility Factors Delaying/Impairing Wound Healing: Immobility;Multiple medical problems;Incontinence Hydrotherapy Plan: Debridement;Dressing change;Patient/family education;Pulsatile lavage with suction Wound Therapy - Frequency: 6X / week Wound Therapy - Follow Up Recommendations: Skilled nursing facility Wound Plan: see above  Wound Therapy Goals- Improve the function of patient's integumentary system by progressing the wound(s) through the phases of wound healing (inflammation - proliferation - remodeling) by: Decrease Necrotic Tissue to: 70 Decrease Necrotic Tissue - Progress: Progressing toward goal Increase Granulation Tissue to: 30 Increase Granulation Tissue - Progress: Progressing toward goal  Goals will be updated until maximal potential achieved or discharge criteria met.  Discharge criteria: when goals achieved, discharge from hospital, MD decision/surgical intervention, no progress towards goals, refusal/missing three consecutive treatments without notification or medical reason.  [place] 06/29/2018, 11:26 AM [place] Pager [phone] Office [phone]

## 2018-06-29 NOTE — Progress Notes (Signed)
ANTICOAGULATION CONSULT NOTE - Follow Up Consult  Pharmacy Consult for heparin Indication: atrial fibrillation  Allergies  Allergen Reactions  . Penicillins Hives    Did it involve swelling of the face/tongue/throat, SOB, or low BP? Unknown Did it involve sudden or severe rash/hives, skin peeling, or any reaction on the inside of your mouth or nose? Unknown Did you need to seek medical attention at a hospital or doctor's office? Unknown When did it last happen? Listed on [facility] If all above answers are "NO", may proceed with cephalosporin use.     Patient Measurements: Height: 5\' 7"  (170.2 cm) Weight: 158 lb 11.7 oz (72 kg) IBW/kg (Calculated) : 61.6  Vital Signs: Temp: 97.9 F (36.6 C) (02/10 0452) Temp Source: Oral (02/10 0452) BP: 114/98 (02/10 0452) Pulse Rate: 73 (02/10 0452)  Labs: Recent Labs    06/27/18 0510 06/27/18 1546 06/28/18 0404 06/28/18 1645 06/29/18 0317 06/29/18 1126  HGB 11.5*  --  11.4*  --  10.8*  --   HCT 36.7  --  35.9*  --  34.0*  --   PLT 173  --  176  --  183  --   APTT 107* 72* 67*  --   --   --   HEPARINUNFRC 0.83* 0.55 0.30 0.18* 0.27* <0.10*  CREATININE 1.05*  --  0.96  --  1.08*  --     Estimated Creatinine Clearance: 41.1 mL/min (A) (by C-G formula based on SCr of 1.08 mg/dL (H)).   Assessment: 78 y.o. F on heparin for afib while Xarelto on hold for possible I&D Heparin levels and aPTT now correlated and following heparin level goal.  8 h HL <0.1,  but appears heparin drip paused a 0651.  Now the  RN reports that patient pulled out IV this morning and  IV restarted by IV RN. Heparin reported restarted approximately 10AM (RN not certain of restart time).  No bleeding noted per RN  Goal of Therapy:  Heparin level 0.3-0.7 units/ml Monitor platelets by anticoagulation protocol: Yes   Plan:  Continue heparin gtt6 at 1250 units/hr F/u 8 hour heparin level Then daily heparin level and CBC  Noah Delaine, RPh Clinical  Pharmacist [phone] Please check AMION for all [facility] numbers 06/29/2018 1:56 PM

## 2018-06-29 NOTE — Progress Notes (Signed)
ANTICOAGULATION CONSULT NOTE - Follow Up Consult  Pharmacy Consult for heparin Indication: atrial fibrillation  Labs: Recent Labs    06/27/18 0510 06/27/18 1546 06/28/18 0404 06/28/18 1645 06/29/18 0317  HGB 11.5*  --  11.4*  --  10.8*  HCT 36.7  --  35.9*  --  34.0*  PLT 173  --  176  --  183  APTT 107* 72* 67*  --   --   HEPARINUNFRC 0.83* 0.55 0.30 0.18* 0.27*  CREATININE 1.05*  --  0.96  --  1.08*    Assessment: 77yo female remains subtherapeutic on heparin though getting closer to goal; no gtt issues or signs of bleeding per RN.  Goal of Therapy:  Heparin level 0.3-0.7 units/ml   Plan:  Will increase heparin gtt by 1-2 units/kg/hr to 1250 units/hr and check level in 8 hours.    Vernard Gambles, PharmD, BCPS  06/29/2018,4:14 AM

## 2018-06-29 NOTE — Progress Notes (Signed)
Patient ID: Regina Sandoval, female   DOB: March 29, 1941, 78 y.o.   MRN: [id number]       Subjective: Patient upset there are multiple people in her room staring at her.  Just finished hydrotherapy  Objective: Vital signs in last 24 hours: Temp:  [97.7 F (36.5 C)-97.9 F (36.6 C)] 97.9 F (36.6 C) (02/10 0452) Pulse Rate:  [73-97] 73 (02/10 0452) Resp:  [18-20] 18 (02/10 0452) BP: (90-114)/(54-98) 114/98 (02/10 0452) SpO2:  [94 %-98 %] 98 % (02/10 0452) Last BM Date: 06/28/18  Intake/Output from previous day: 02/09 0701 - 02/10 0700 In: 1654.4 [P.O.:585; I.V.:444.5; IV Piggyback:624.8] Out: -  Intake/Output this shift: Total I/O In: 118 [P.O.:118] Out: -   PE: Skin: wound now fully open and some necrotic tissue at the base.  This is covering the majority of the base specifically on the right side of her wound.  Left side shows some evidence of granulation tissue.  This necrotic tissue is fairly "stuck on" appearing over her sacrum.  Lab Results:  Recent Labs    06/28/18 0404 06/29/18 0317  WBC 25.9* 23.3*  HGB 11.4* 10.8*  HCT 35.9* 34.0*  PLT 176 183   BMET Recent Labs    06/28/18 0404 06/29/18 0317  NA 138 139  K 3.6 3.3*  CL 108 108  CO2 17* 19*  GLUCOSE 85 96  BUN 34* 40*  CREATININE 0.96 1.08*  CALCIUM 8.4* 8.4*   PT/INR No results for input(s): LABPROT, INR in the last 72 hours. CMP     Component Value Date/Time   NA 139 06/29/2018 0317   NA 142 06/24/2018 0245   K 3.3 (L) 06/29/2018 0317   CL 108 06/29/2018 0317   CO2 19 (L) 06/29/2018 0317   GLUCOSE 96 06/29/2018 0317   BUN 40 (H) 06/29/2018 0317   BUN 40 (A) 06/24/2018 0245   CREATININE 1.08 (H) 06/29/2018 0317   CALCIUM 8.4 (L) 06/29/2018 0317   PROT 7.4 06/24/2018 1430   PROT 7.0 12/24/2016 0849   ALBUMIN 2.0 (L) 07/04/2018 1430   ALBUMIN 3.8 12/24/2016 0849   AST 53 (H) 07/07/2018 1430   ALT 52 (H) 07/13/2018 1430   ALKPHOS 123 06/30/2018 1430   BILITOT 1.2 06/21/2018 1430   BILITOT  0.6 12/24/2016 0849   GFRNONAA 49 (L) 06/29/2018 0317   GFRAA 57 (L) 06/29/2018 0317   Lipase  No results found for: LIPASE     Studies/Results: No results found.  Anti-infectives: Anti-infectives (From admission, onward)   Start     Dose/Rate Route Frequency Ordered Stop   06/28/18 1800  vancomycin (VANCOCIN) IVPB 1000 mg/200 mL premix     1,000 mg 200 mL/hr over 60 Minutes Intravenous Every 24 hours 06/28/18 1402     06/26/18 1800  vancomycin (VANCOCIN) 1,250 mg in sodium chloride 0.9 % 250 mL IVPB  Status:  Discontinued     1,250 mg 166.7 mL/hr over 90 Minutes Intravenous Every 24 hours 06/27/2018 1632 06/28/18 1402   07/01/2018 2100  cefTRIAXone (ROCEPHIN) 2 g in sodium chloride 0.9 % 100 mL IVPB     2 g 200 mL/hr over 30 Minutes Intravenous Every 24 hours 06/28/2018 1753     06/26/2018 1800  metroNIDAZOLE (FLAGYL) IVPB 500 mg     500 mg 100 mL/hr over 60 Minutes Intravenous Every 8 hours 07/08/2018 1753     07/11/2018 1630  vancomycin (VANCOCIN) 1,500 mg in sodium chloride 0.9 % 500 mL IVPB  Status:  Discontinued  1,500 mg 250 mL/hr over 120 Minutes Intravenous  Once 07/17/2018 1617 06/28/18 1402   07/04/2018 1515  vancomycin (VANCOCIN) IVPB 1000 mg/200 mL premix  Status:  Discontinued     1,000 mg 200 mL/hr over 60 Minutes Intravenous  Once 07/10/2018 1505 07/11/2018 1617   06/21/2018 1515  ceFEPIme (MAXIPIME) 2 g in sodium chloride 0.9 % 100 mL IVPB     2 g 200 mL/hr over 30 Minutes Intravenous  Once 07/17/2018 1505 07/12/2018 1637       Assessment/Plan Principal Problem:   Infected decubitus ulcer, unspecified pressure ulcer stage Active Problems:   HYPERCHOLESTEROLEMIA   Dementia without behavioral disturbance (HCC)   Essential hypertension   Persistent atrial fibrillation   CKD (chronic kidney disease)   Chronic diastolic heart failure (HCC)  Sacral decubitus ulcer - patient takes xarelto PTA, last dose PM of 2/5 - recommend transitioning to heparin while admitted - PT  hydrotherapy daily through the weekend  - santyl and BID wet to dry dressing - wound has made improvements since last seen.  There is still a fair portion of necrotic tissue at the base, but think hydrotherapy, santyl, and dressing changes will continue to clean this up without having to go to the OR for surgical debridement.  FEN: heart healthy VTE: heparin ID: Vanc, Flagyl & Rocephin 02/06 Follow up: TBD   LOS: 4 days    Letha Cape , [facility] 06/29/2018, 12:31 PM Pager: [phone]

## 2018-06-29 NOTE — Progress Notes (Signed)
ANTICOAGULATION CONSULT NOTE  Pharmacy Consult:  Heparin Indication: atrial fibrillation  Allergies  Allergen Reactions  . Penicillins Hives    Did it involve swelling of the face/tongue/throat, SOB, or low BP? Unknown Did it involve sudden or severe rash/hives, skin peeling, or any reaction on the inside of your mouth or nose? Unknown Did you need to seek medical attention at a hospital or doctor's office? Unknown When did it last happen? Listed on [facility] If all above answers are "NO", may proceed with cephalosporin use.     Patient Measurements: Height: 5\' 7"  (170.2 cm) Weight: 158 lb 11.7 oz (72 kg) IBW/kg (Calculated) : 61.6  Heparin dosing weight = 72 kg  Vital Signs: Temp: 97.8 F (36.6 C) (02/10 1427) Temp Source: Oral (02/10 1427) BP: 97/76 (02/10 1427) Pulse Rate: 104 (02/10 1427)  Labs: Recent Labs    06/27/18 0510 06/27/18 1546 06/28/18 0404  06/29/18 0317 06/29/18 1126 06/29/18 1753  HGB 11.5*  --  11.4*  --  10.8*  --   --   HCT 36.7  --  35.9*  --  34.0*  --   --   PLT 173  --  176  --  183  --   --   APTT 107* 72* 67*  --   --   --   --   HEPARINUNFRC 0.83* 0.55 0.30   < > 0.27* <0.10* 0.22*  CREATININE 1.05*  --  0.96  --  1.08*  --   --    < > = values in this interval not displayed.    Estimated Creatinine Clearance: 41.1 mL/min (A) (by C-G formula based on SCr of 1.08 mg/dL (H)).   Assessment: 77 YOF on heparin for Afib while Xarelto on hold for possible I&D.  Heparin levels and aPTT now correlated and following heparin level goal.  Heparin level is sub-therapeutic.  No issue with infusion since it has been restarted per RN.  No bleeding reported.   Goal of Therapy:  Heparin level 0.3-0.7 units/ml Monitor platelets by anticoagulation protocol: Yes   Plan:  Increase heparin gtt to 1400 units/hr Check 8 hr heparin level with AM labs   Philomena Buttermore D. Laney Potash, PharmD, BCPS, BCCCP 06/29/2018, 8:14 PM

## 2018-06-29 NOTE — Care Management Important Message (Signed)
Important Message  Patient Details  Name: Regina Sandoval MRN: [id number] Date of Birth: 1940/12/08   Medicare Important Message Given:  Yes    Dorena Bodo 06/29/2018, 4:27 PM

## 2018-06-29 NOTE — Care Management Important Message (Signed)
Important Message  Patient Details  Name: Regina Sandoval MRN: [id number] Date of Birth: Feb 12, 1941   Medicare Important Message Given:  Yes    Dorena Bodo 06/29/2018, 4:26 PM

## 2018-06-29 NOTE — Progress Notes (Signed)
PROGRESS NOTE        PATIENT DETAILS Name: Regina Sandoval Age: 78 y.o. Sex: female Date of Birth: 1941-03-31 Admit Date: 07/01/2018 Admitting Physician Dorcas CarrowKuber Karmine Kauer, MD ZHY:[id number]:Hopper, Titus DubinWilliam F, MD  Brief Narrative: Patient is a 78 y.o. female with history of dementia, PAF, dyslipidemia, nonambulatory status-mostly bed to wheelchair bound-resident of SNF-presented with sepsis secondary to infected unstageable sacral decubitus ulcer.  Subjective: No major issues overnight-denies any chest pain or shortness of breath.  Assessment/Plan: Sepsis secondary to infected sacral decubitus ulcer-unstageable: Afebrile-leukocytosis persists but slowly downtrending.  Blood cultures negative.  Remains on broad-spectrum antimicrobial therapy-General surgery following-with plans for potential surgical debridement on reevaluation today.  Has undergone hydrotherapy over the weekend.  Await further recommendations from general surgery.    Persistent atrial fibrillation: Controlled with Cardizem and metoprolol.  Xarelto remains on hold.  Remains on IV heparin-until we are sure patient is not going to require surgical debridement.   Chronic diastolic heart failure: Volume status stable-continue diuretics.  Follow electrolytes periodically.    Dementia:  Stable-minimally confused-suspect at baseline, continue with Aricept and Depakote.  .   Dyslipidemia: Continue statin  Hypertension: Controlled-continue Cardizem and metoprolol-follow and optimize accordingly.   COPD:  Continue with as needed bronchodilators.  Chronic debility: At baseline bed to wheelchair bound-apparently has not ambulated in the past several months.  She appears to have significant generalized weakness.  Await recommendations from rehab services.  Severe malnutrition in context of acute illness/injury  DVT Prophylaxis: Full dose anticoagulation with Heparin  Code Status: Full code   Family  Communication: None at bedside  Disposition Plan: Remain inpatient-back to SNF-in the next few days.  Antimicrobial agents: Anti-infectives (From admission, onward)   Start     Dose/Rate Route Frequency Ordered Stop   06/28/18 1800  vancomycin (VANCOCIN) IVPB 1000 mg/200 mL premix     1,000 mg 200 mL/hr over 60 Minutes Intravenous Every 24 hours 06/28/18 1402     06/26/18 1800  vancomycin (VANCOCIN) 1,250 mg in sodium chloride 0.9 % 250 mL IVPB  Status:  Discontinued     1,250 mg 166.7 mL/hr over 90 Minutes Intravenous Every 24 hours 07/14/2018 1632 06/28/18 1402   06/26/2018 2100  cefTRIAXone (ROCEPHIN) 2 g in sodium chloride 0.9 % 100 mL IVPB     2 g 200 mL/hr over 30 Minutes Intravenous Every 24 hours 07/17/2018 1753     06/23/2018 1800  metroNIDAZOLE (FLAGYL) IVPB 500 mg     500 mg 100 mL/hr over 60 Minutes Intravenous Every 8 hours 07/14/2018 1753     06/27/2018 1630  vancomycin (VANCOCIN) 1,500 mg in sodium chloride 0.9 % 500 mL IVPB  Status:  Discontinued     1,500 mg 250 mL/hr over 120 Minutes Intravenous  Once 06/27/2018 1617 06/28/18 1402   07/12/2018 1515  vancomycin (VANCOCIN) IVPB 1000 mg/200 mL premix  Status:  Discontinued     1,000 mg 200 mL/hr over 60 Minutes Intravenous  Once 07/10/2018 1505 06/24/2018 1617   06/29/2018 1515  ceFEPIme (MAXIPIME) 2 g in sodium chloride 0.9 % 100 mL IVPB     2 g 200 mL/hr over 30 Minutes Intravenous  Once 07/16/2018 1505 06/21/2018 1637      Procedures: None  CONSULTS:  general surgery  Time spent: 25- minutes-Greater than 50% of this time was spent in counseling, explanation of diagnosis, planning  of further management, and coordination of care.  MEDICATIONS: Scheduled Meds: . allopurinol  300 mg Oral Daily  . atorvastatin  40 mg Oral q1800  . collagenase   Topical BID  . diltiazem  90 mg Oral BID  . donepezil  10 mg Oral QHS  . feeding supplement (ENSURE ENLIVE)  237 mL Oral BID BM  . feeding supplement (PRO-STAT SUGAR FREE 64)  30 mL Oral  BID  . furosemide  80 mg Oral Daily  . mouth rinse  15 mL Mouth Rinse BID  . metoprolol  200 mg Oral Daily  . multivitamin with minerals  1 tablet Oral Daily  . polyethylene glycol  17 g Oral Daily  . polyvinyl alcohol  2 drop Both Eyes BID  . saccharomyces boulardii  250 mg Oral BID  . Vitamin D (Ergocalciferol)  50,000 Units Oral Q30 days   Continuous Infusions: . sodium chloride 10 mL/hr at 06/29/18 0700  . cefTRIAXone (ROCEPHIN)  IV Stopped (06/28/18 2136)  . heparin Stopped (06/29/18 5465)  . metronidazole Stopped (06/29/18 0356)  . vancomycin Stopped (06/28/18 1945)   PRN Meds:.acetaminophen **OR** acetaminophen, albuterol, bisacodyl, fluticasone, guaiFENesin, ondansetron **OR** ondansetron (ZOFRAN) IV, oxyCODONE   PHYSICAL EXAM: Vital signs: Vitals:   06/28/18 1022 06/28/18 1350 06/28/18 2056 06/29/18 0452  BP: (!) 84/56 (!) 90/54 94/66 (!) 114/98  Pulse: (!) 110 93 97 73  Resp:   20 18  Temp:  97.7 F (36.5 C) 97.8 F (36.6 C) 97.9 F (36.6 C)  TempSrc:  Oral Oral Oral  SpO2:  94% 96% 98%  Weight:      Height:       Filed Weights   07/04/2018 1339  Weight: 72 kg   Body mass index is 24.86 kg/m.   General appearance:Awake, alert, not in any distress.  Chronically sick appearing. Eyes:no scleral icterus. HEENT: Atraumatic and Normocephalic Neck: supple, no JVD. Resp:Good air entry bilaterally, no added sounds heard anteriorly. CVS: S1 S2 regular, no murmurs.  GI: Bowel sounds present, Non tender and not distended with no gaurding, rigidity or rebound. Extremities: B/L Lower Ext shows no edema, both legs are warm to touch Neurology: Has severe generalized weakness-but seems to move all 4 extremities Psychiatric: Normal judgment and insight. Normal mood. Musculoskeletal:No digital cyanosis Skin:No Rash, warm and dry   I have personally reviewed following labs and imaging studies  LABORATORY DATA: CBC: Recent Labs  Lab 06/24/18 06/27/2018 1341  06/22/2018 1451 06/26/18 0241 06/27/18 0510 06/28/18 0404 06/29/18 0317  WBC 24.2 27.8*  --  26.9* 26.1* 25.9* 23.3*  NEUTROABS 22 26.1*  --   --   --   --   --   HGB 12.8 13.4 13.9 11.9* 11.5* 11.4* 10.8*  HCT 40 42.0 41.0 37.5 36.7 35.9* 34.0*  MCV  --  93.8  --  91.5 92.2 90.7 89.9  PLT 180 211  --  168 173 176 183    Basic Metabolic Panel: Recent Labs  Lab 07/01/2018 1430 07/13/2018 1451 06/26/18 0241 06/27/18 0510 06/28/18 0404 06/29/18 0317  NA 141 141 140 143 138 139  K 5.1 4.4 4.2 3.6 3.6 3.3*  CL 108  --  108 107 108 108  CO2 19*  --  19* 21* 17* 19*  GLUCOSE 131*  --  113* 80 85 96  BUN 40*  --  36* 35* 34* 40*  CREATININE 0.95 0.80 0.68 1.05* 0.96 1.08*  CALCIUM 9.2  --  8.8* 8.7* 8.4* 8.4*  GFR: Estimated Creatinine Clearance: 41.1 mL/min (A) (by C-G formula based on SCr of 1.08 mg/dL (H)).  Liver Function Tests: Recent Labs  Lab 07/11/2018 1430  AST 53*  ALT 52*  ALKPHOS 123  BILITOT 1.2  PROT 7.4  ALBUMIN 2.0*   No results for input(s): LIPASE, AMYLASE in the last 168 hours. No results for input(s): AMMONIA in the last 168 hours.  Coagulation Profile: No results for input(s): INR, PROTIME in the last 168 hours.  Cardiac Enzymes: No results for input(s): CKTOTAL, CKMB, CKMBINDEX, TROPONINI in the last 168 hours.  BNP (last 3 results) No results for input(s): PROBNP in the last 8760 hours.  HbA1C: No results for input(s): HGBA1C in the last 72 hours.  CBG: No results for input(s): GLUCAP in the last 168 hours.  Lipid Profile: No results for input(s): CHOL, HDL, LDLCALC, TRIG, CHOLHDL, LDLDIRECT in the last 72 hours.  Thyroid Function Tests: No results for input(s): TSH, T4TOTAL, FREET4, T3FREE, THYROIDAB in the last 72 hours.  Anemia Panel: No results for input(s): VITAMINB12, FOLATE, FERRITIN, TIBC, IRON, RETICCTPCT in the last 72 hours.  Urine analysis: No results found for: COLORURINE, APPEARANCEUR, LABSPEC, PHURINE, GLUCOSEU, HGBUR,  BILIRUBINUR, KETONESUR, PROTEINUR, UROBILINOGEN, NITRITE, LEUKOCYTESUR  Sepsis Labs: Lactic Acid, Venous    Component Value Date/Time   LATICACIDVEN 2.9 (HH) 03-Jul-2018 1341    MICROBIOLOGY: Recent Results (from the past 240 hour(s))  Blood culture (routine x 2)     Status: None (Preliminary result)   Collection Time: 07/05/2018  2:40 PM  Result Value Ref Range Status   Specimen Description BLOOD LEFT ANTECUBITAL  Final   Special Requests   Final    BOTTLES DRAWN AEROBIC AND ANAEROBIC Blood Culture adequate volume   Culture   Final    NO GROWTH 3 DAYS Performed at [facility], [address], [place], [place] [address]    Report Status PENDING  Incomplete  Blood culture (routine x 2)     Status: None (Preliminary result)   Collection Time: Jul 03, 2018  7:50 PM  Result Value Ref Range Status   Specimen Description BLOOD LEFT WRIST  Final   Special Requests   Final    BOTTLES DRAWN AEROBIC AND ANAEROBIC Blood Culture results may not be optimal due to an inadequate volume of blood received in culture bottles   Culture   Final    NO GROWTH 3 DAYS Performed at [facility], [address], [place], [place] [address]    Report Status PENDING  Incomplete  MRSA PCR Screening     Status: None   Collection Time: 06/27/18 11:12 PM  Result Value Ref Range Status   MRSA by PCR NEGATIVE NEGATIVE Final    Comment:        The GeneXpert MRSA Assay (FDA approved for NASAL specimens only), is one component of a comprehensive MRSA colonization surveillance program. It is not intended to diagnose MRSA infection nor to guide or monitor treatment for MRSA infections. Performed at [facility], [address] [place], [place] [address]     RADIOLOGY STUDIES/RESULTS: No results found.   LOS: 4 days   Jeoffrey Massed, MD  [facility]  If 7PM-7AM, please contact night-coverage  Please page via [email or web address]  Go to [email or web address] and use [facility]'s universal  password to access. If you do not have the password, please contact the hospital operator.  Locate the [facility] provider you are looking for under [facility] and page to  a number that you can be directly reached. If you still have difficulty reaching the provider, please page the [facility] (Director on Call) for the Hospitalists listed on amion for assistance.  06/29/2018, 9:55 AM

## 2018-06-30 DIAGNOSIS — I10 Essential (primary) hypertension: Secondary | ICD-10-CM | POA: Diagnosis not present

## 2018-06-30 DIAGNOSIS — L8990 Pressure ulcer of unspecified site, unspecified stage: Secondary | ICD-10-CM | POA: Diagnosis not present

## 2018-06-30 DIAGNOSIS — I5032 Chronic diastolic (congestive) heart failure: Secondary | ICD-10-CM | POA: Diagnosis not present

## 2018-06-30 DIAGNOSIS — F0390 Unspecified dementia without behavioral disturbance: Secondary | ICD-10-CM | POA: Diagnosis not present

## 2018-06-30 LAB — CULTURE, BLOOD (ROUTINE X 2)
CULTURE: NO GROWTH
Culture: NO GROWTH
Special Requests: ADEQUATE

## 2018-06-30 LAB — CBC
HCT: 32.3 % — ABNORMAL LOW (ref 36.0–46.0)
HEMOGLOBIN: 10.8 g/dL — AB (ref 12.0–15.0)
MCH: 30 pg (ref 26.0–34.0)
MCHC: 33.4 g/dL (ref 30.0–36.0)
MCV: 89.7 fL (ref 80.0–100.0)
Platelets: 214 10*3/uL (ref 150–400)
RBC: 3.6 MIL/uL — ABNORMAL LOW (ref 3.87–5.11)
RDW: 18.5 % — ABNORMAL HIGH (ref 11.5–15.5)
WBC: 24 10*3/uL — ABNORMAL HIGH (ref 4.0–10.5)
nRBC: 1.1 % — ABNORMAL HIGH (ref 0.0–0.2)

## 2018-06-30 LAB — HEPARIN LEVEL (UNFRACTIONATED): Heparin Unfractionated: 0.51 IU/mL (ref 0.30–0.70)

## 2018-06-30 MED ORDER — SODIUM CHLORIDE 0.9 % IV BOLUS
500.0000 mL | Freq: Once | INTRAVENOUS | Status: AC
  Administered 2018-06-30: 500 mL via INTRAVENOUS

## 2018-06-30 NOTE — Progress Notes (Signed)
ANTICOAGULATION CONSULT NOTE  Pharmacy Consult:  Heparin Indication: atrial fibrillation  Allergies  Allergen Reactions  . Penicillins Hives    Did it involve swelling of the face/tongue/throat, SOB, or low BP? Unknown Did it involve sudden or severe rash/hives, skin peeling, or any reaction on the inside of your mouth or nose? Unknown Did you need to seek medical attention at a hospital or doctor's office? Unknown When did it last happen? Listed on [facility] If all above answers are "NO", may proceed with cephalosporin use.     Patient Measurements: Height: 5\' 7"  (170.2 cm) Weight: 158 lb 11.7 oz (72 kg) IBW/kg (Calculated) : 61.6  Heparin dosing weight = 72 kg  Vital Signs: Temp: 98 F (36.7 C) (02/10 2231) BP: 105/54 (02/10 2231) Pulse Rate: 66 (02/10 2231)  Labs: Recent Labs    06/27/18 0510 06/27/18 1546 06/28/18 0404  06/29/18 0317 06/29/18 1126 06/29/18 1753 06/30/18 0402  HGB 11.5*  --  11.4*  --  10.8*  --   --  10.8*  HCT 36.7  --  35.9*  --  34.0*  --   --  32.3*  PLT 173  --  176  --  183  --   --  214  APTT 107* 72* 67*  --   --   --   --   --   HEPARINUNFRC 0.83* 0.55 0.30   < > 0.27* <0.10* 0.22* 0.51  CREATININE 1.05*  --  0.96  --  1.08*  --   --   --    < > = values in this interval not displayed.    Estimated Creatinine Clearance: 41.1 mL/min (A) (by C-G formula based on SCr of 1.08 mg/dL (H)).   Assessment: 77 YOF on heparin for Afib while Xarelto on hold for possible I&D.  Heparin levels and aPTT now correlated and following heparin level goal.  Heparin level therapeutic (0.51) on gtt at 1400 units/hr.  Hgb down to 10.8, plt wnl/stable.   Goal of Therapy:  Heparin level 0.3-0.7 units/ml Monitor platelets by anticoagulation protocol: Yes   Plan:  Continue heparin gtt at 1400 units/hr Will f/u daily heparin level and CBC  Christoper Fabian, PharmD, BCPS Clinical pharmacist  **Pharmacist phone directory can now be found on [email or web address] ([email or web address]).  Listed under [facility]. 06/30/2018, 5:07 AM

## 2018-06-30 NOTE — Progress Notes (Signed)
PROGRESS NOTE        PATIENT DETAILS Name: Regina Sandoval Age: 78 y.o. Sex: female Date of Birth: July 25, 1940 Admit Date: 07/15/2018 Admitting Physician Barb Merino, MD JJK:[id number], Darrick Penna, MD  Brief Narrative: Patient is a 78 y.o. female with history of dementia, PAF, dyslipidemia, nonambulatory status-mostly bed to wheelchair bound-resident of SNF-presented with sepsis secondary to infected unstageable sacral decubitus ulcer.  Subjective: Lying comfortably in bed.  No chest pain or shortness of breath.  Assessment/Plan: Sepsis secondary to infected sacral decubitus ulcer-unstageable: Afebrile-leukocytosis persists but slowly downtrending.  Spoke with general surgery-bone is likely exposed at the wound bed-suspect she may have osteomyelitis.  Has already been on broad-spectrum antimicrobial therapy since admission.  Check ESR/CRP-suspect she will require prolonged antimicrobial therapy-we will touch base with ID tomorrow.  Per General surgery-does not require surgical debridement, recommendations are to continue with hydrotherapy through the end of the week and subsequently discharged back to SNF.  Will discontinue vancomycin today and discontinue with Rocephin and Flagyl-as this is most likely polymicrobial infection.  Persistent atrial fibrillation: Controlled with Cardizem and metoprolol, Xarelto remains on hold.  For now continue with IV heparin-once we are sure that patient is not going to require any surgical procedures we will resume Xarelto.     Chronic diastolic heart failure: Volume status is stable-continue diuretics and follow electrolytes periodically.    Dementia:  Stable-minimally confused-suspect at baseline, continue with Aricept and Depakote.  .   Dyslipidemia: Continue statin  Hypertension: Controlled-continue Cardizem and metoprolol-follow and optimize accordingly.   COPD:  Continue with as needed bronchodilators.  Chronic debility: At  baseline bed to wheelchair bound-apparently has not ambulated in the past several months.  She appears to have significant generalized weakness.  Await recommendations from rehab services.  Severe malnutrition in context of acute illness/injury  DVT Prophylaxis: Full dose anticoagulation with Heparin  Code Status: Full code   Family Communication: None at bedside  Disposition Plan: Remain inpatient-back to SNF-at the end of the week  Antimicrobial agents: Anti-infectives (From admission, onward)   Start     Dose/Rate Route Frequency Ordered Stop   06/28/18 1800  vancomycin (VANCOCIN) IVPB 1000 mg/200 mL premix     1,000 mg 200 mL/hr over 60 Minutes Intravenous Every 24 hours 06/28/18 1402     06/26/18 1800  vancomycin (VANCOCIN) 1,250 mg in sodium chloride 0.9 % 250 mL IVPB  Status:  Discontinued     1,250 mg 166.7 mL/hr over 90 Minutes Intravenous Every 24 hours 07/13/2018 1632 06/28/18 1402   06/28/2018 2100  cefTRIAXone (ROCEPHIN) 2 g in sodium chloride 0.9 % 100 mL IVPB     2 g 200 mL/hr over 30 Minutes Intravenous Every 24 hours 06/26/2018 1753     07/12/2018 1800  metroNIDAZOLE (FLAGYL) IVPB 500 mg     500 mg 100 mL/hr over 60 Minutes Intravenous Every 8 hours 06/27/2018 1753     07/10/2018 1630  vancomycin (VANCOCIN) 1,500 mg in sodium chloride 0.9 % 500 mL IVPB  Status:  Discontinued     1,500 mg 250 mL/hr over 120 Minutes Intravenous  Once 07/09/2018 1617 06/28/18 1402   06/24/2018 1515  vancomycin (VANCOCIN) IVPB 1000 mg/200 mL premix  Status:  Discontinued     1,000 mg 200 mL/hr over 60 Minutes Intravenous  Once 07/06/2018 1505 06/30/2018 1617   07/17/2018 1515  ceFEPIme (MAXIPIME) 2 g in sodium chloride 0.9 % 100 mL IVPB     2 g 200 mL/hr over 30 Minutes Intravenous  Once 07/17/2018 1505 06/21/2018 1637      Procedures: None  CONSULTS:  general surgery  Time spent: 25- minutes-Greater than 50% of this time was spent in counseling, explanation of diagnosis, planning of further  management, and coordination of care.  MEDICATIONS: Scheduled Meds: . allopurinol  300 mg Oral Daily  . atorvastatin  40 mg Oral q1800  . collagenase   Topical BID  . diltiazem  90 mg Oral BID  . donepezil  10 mg Oral QHS  . feeding supplement (ENSURE ENLIVE)  237 mL Oral BID BM  . feeding supplement (PRO-STAT SUGAR FREE 64)  30 mL Oral BID  . furosemide  80 mg Oral Daily  . mouth rinse  15 mL Mouth Rinse BID  . metoprolol  200 mg Oral Daily  . multivitamin with minerals  1 tablet Oral Daily  . polyethylene glycol  17 g Oral Daily  . polyvinyl alcohol  2 drop Both Eyes BID  . saccharomyces boulardii  250 mg Oral BID  . Vitamin D (Ergocalciferol)  50,000 Units Oral Q30 days   Continuous Infusions: . sodium chloride 10 mL/hr at 06/29/18 1552  . cefTRIAXone (ROCEPHIN)  IV 2 g (06/29/18 2144)  . heparin 1,400 Units/hr (06/30/18 0927)  . metronidazole 500 mg (06/30/18 1023)  . vancomycin Stopped (06/30/18 1016)   PRN Meds:.acetaminophen **OR** acetaminophen, albuterol, bisacodyl, fluticasone, guaiFENesin, ondansetron **OR** ondansetron (ZOFRAN) IV, oxyCODONE   PHYSICAL EXAM: Vital signs: Vitals:   06/29/18 0452 06/29/18 1427 06/29/18 2231 06/30/18 0524  BP: (!) 114/98 97/76 (!) 105/54 138/89  Pulse: 73 (!) 104 66 64  Resp: '18 18 18 18  '$ Temp: 97.9 F (36.6 C) 97.8 F (36.6 C) 98 F (36.7 C) 97.8 F (36.6 C)  TempSrc: Oral Oral    SpO2: 98% 100% 98% 100%  Weight:      Height:       Filed Weights   07/08/2018 1339  Weight: 72 kg   Body mass index is 24.86 kg/m.   General appearance:Awake, alert, not in any distress.  Eyes:no scleral icterus. HEENT: Atraumatic and Normocephalic Neck: supple, no JVD. Resp:Good air entry bilaterally,no rales or rhonchi CVS: S1 S2 regular, no murmurs.  GI: Bowel sounds present, Non tender and not distended with no gaurding, rigidity or rebound. Extremities: B/L Lower Ext shows no edema, both legs are warm to touch Neurology: Has  generalized weakness but moves all 4 extremities. Psychiatric: Normal judgment and insight. Normal mood. Musculoskeletal:No digital cyanosis Skin:No Rash, warm and dry Wounds: See picture below       I have personally reviewed following labs and imaging studies  LABORATORY DATA: CBC: Recent Labs  Lab 06/24/18  06/29/2018 1341  06/26/18 0241 06/27/18 0510 06/28/18 0404 06/29/18 0317 06/30/18 0402  WBC 24.2   < > 27.8*  --  26.9* 26.1* 25.9* 23.3* 24.0*  NEUTROABS 22  --  26.1*  --   --   --   --   --   --   HGB 12.8  --  13.4   < > 11.9* 11.5* 11.4* 10.8* 10.8*  HCT 40  --  42.0   < > 37.5 36.7 35.9* 34.0* 32.3*  MCV  --    < > 93.8  --  91.5 92.2 90.7 89.9 89.7  PLT 180  --  211  --  168  173 176 183 214   < > = values in this interval not displayed.    Basic Metabolic Panel: Recent Labs  Lab 06/24/2018 1430 06/27/2018 1451 06/26/18 0241 06/27/18 0510 06/28/18 0404 06/29/18 0317  NA 141 141 140 143 138 139  K 5.1 4.4 4.2 3.6 3.6 3.3*  CL 108  --  108 107 108 108  CO2 19*  --  19* 21* 17* 19*  GLUCOSE 131*  --  113* 80 85 96  BUN 40*  --  36* 35* 34* 40*  CREATININE 0.95 0.80 0.68 1.05* 0.96 1.08*  CALCIUM 9.2  --  8.8* 8.7* 8.4* 8.4*    GFR: Estimated Creatinine Clearance: 41.1 mL/min (A) (by C-G formula based on SCr of 1.08 mg/dL (H)).  Liver Function Tests: Recent Labs  Lab 07/14/2018 1430  AST 53*  ALT 52*  ALKPHOS 123  BILITOT 1.2  PROT 7.4  ALBUMIN 2.0*   No results for input(s): LIPASE, AMYLASE in the last 168 hours. No results for input(s): AMMONIA in the last 168 hours.  Coagulation Profile: No results for input(s): INR, PROTIME in the last 168 hours.  Cardiac Enzymes: No results for input(s): CKTOTAL, CKMB, CKMBINDEX, TROPONINI in the last 168 hours.  BNP (last 3 results) No results for input(s): PROBNP in the last 8760 hours.  HbA1C: No results for input(s): HGBA1C in the last 72 hours.  CBG: No results for input(s): GLUCAP in the last  168 hours.  Lipid Profile: No results for input(s): CHOL, HDL, LDLCALC, TRIG, CHOLHDL, LDLDIRECT in the last 72 hours.  Thyroid Function Tests: No results for input(s): TSH, T4TOTAL, FREET4, T3FREE, THYROIDAB in the last 72 hours.  Anemia Panel: No results for input(s): VITAMINB12, FOLATE, FERRITIN, TIBC, IRON, RETICCTPCT in the last 72 hours.  Urine analysis: No results found for: COLORURINE, APPEARANCEUR, LABSPEC, PHURINE, GLUCOSEU, HGBUR, BILIRUBINUR, KETONESUR, PROTEINUR, UROBILINOGEN, NITRITE, LEUKOCYTESUR  Sepsis Labs: Lactic Acid, Venous    Component Value Date/Time   LATICACIDVEN 2.9 ([place]) 06/21/2018 1341    MICROBIOLOGY: Recent Results (from the past 240 hour(s))  Blood culture (routine x 2)     Status: None   Collection Time: 06/30/2018  2:40 PM  Result Value Ref Range Status   Specimen Description BLOOD LEFT ANTECUBITAL  Final   Special Requests   Final    BOTTLES DRAWN AEROBIC AND ANAEROBIC Blood Culture adequate volume   Culture   Final    NO GROWTH 5 DAYS Performed at [facility], [address] [place], [place] [address]    Report Status 06/30/2018 FINAL  Final  Blood culture (routine x 2)     Status: None   Collection Time: 06/29/2018  7:50 PM  Result Value Ref Range Status   Specimen Description BLOOD LEFT WRIST  Final   Special Requests   Final    BOTTLES DRAWN AEROBIC AND ANAEROBIC Blood Culture results may not be optimal due to an inadequate volume of blood received in culture bottles   Culture   Final    NO GROWTH 5 DAYS Performed at [facility], [place] [address] [place], [place] [address]    Report Status 06/30/2018 FINAL  Final  MRSA PCR Screening     Status: None   Collection Time: 06/27/18 11:12 PM  Result Value Ref Range Status   MRSA by PCR NEGATIVE NEGATIVE Final    Comment:        The GeneXpert MRSA Assay (FDA approved for NASAL specimens only), is  one component of a comprehensive MRSA colonization surveillance program. It is  not intended to diagnose MRSA infection nor to guide or monitor treatment for MRSA infections. Performed at [facility], [place] [address], [place], [place] [address]     RADIOLOGY STUDIES/RESULTS: No results found.   LOS: 5 days   Oren Binet, MD  [facility]  If 7PM-7AM, please contact night-coverage  Please page via [email or web address]  Go to [email or web address] and use 's universal password to access. If you do not have the password, please contact the hospital operator.  Locate the [facility] provider you are looking for under [facility] and page to a number that you can be directly reached. If you still have difficulty reaching the provider, please page the [facility] (Director on Call) for the Hospitalists listed on amion for assistance.  06/30/2018, 1:47 PM

## 2018-06-30 NOTE — Progress Notes (Signed)
Patient ID: Regina Sandoval, female   DOB: September 05, 1940, 78 y.o.   MRN: [id number]       Subjective: Laying in bed in NAD  Objective: Vital signs in last 24 hours: Temp:  [97.8 F (36.6 C)-98 F (36.7 C)] 97.8 F (36.6 C) (02/11 0524) Pulse Rate:  [64-104] 64 (02/11 0524) Resp:  [18] 18 (02/11 0524) BP: (97-138)/(54-89) 138/89 (02/11 0524) SpO2:  [98 %-100 %] 100 % (02/11 0524) Last BM Date: 06/30/18  Intake/Output from previous day: 02/10 0701 - 02/11 0700 In: 755.1 [P.O.:294; I.V.:161.1; IV Piggyback:300] Out: -  Intake/Output this shift: Total I/O In: 60 [P.O.:60] Out: -   PE: Skin: stage IV sacral wound.  Mostly with necrotic tissue at the base of her wound, but no evidence of purulent drainage.  This tissue is somewhat thin and tight on the base of the wound.  There is bone exposed at the superior aspect of the wound.     Lab Results:  Recent Labs    06/29/18 0317 06/30/18 0402  WBC 23.3* 24.0*  HGB 10.8* 10.8*  HCT 34.0* 32.3*  PLT 183 214   BMET Recent Labs    06/28/18 0404 06/29/18 0317  NA 138 139  K 3.6 3.3*  CL 108 108  CO2 17* 19*  GLUCOSE 85 96  BUN 34* 40*  CREATININE 0.96 1.08*  CALCIUM 8.4* 8.4*   PT/INR No results for input(s): LABPROT, INR in the last 72 hours. CMP     Component Value Date/Time   NA 139 06/29/2018 0317   NA 142 06/24/2018 0245   K 3.3 (L) 06/29/2018 0317   CL 108 06/29/2018 0317   CO2 19 (L) 06/29/2018 0317   GLUCOSE 96 06/29/2018 0317   BUN 40 (H) 06/29/2018 0317   BUN 40 (A) 06/24/2018 0245   CREATININE 1.08 (H) 06/29/2018 0317   CALCIUM 8.4 (L) 06/29/2018 0317   PROT 7.4 06/24/2018 1430   PROT 7.0 12/24/2016 0849   ALBUMIN 2.0 (L) 07/06/2018 1430   ALBUMIN 3.8 12/24/2016 0849   AST 53 (H) 06/29/2018 1430   ALT 52 (H) 06/28/2018 1430   ALKPHOS 123 07/05/2018 1430   BILITOT 1.2 07/16/2018 1430   BILITOT 0.6 12/24/2016 0849   GFRNONAA 49 (L) 06/29/2018 0317   GFRAA 57 (L) 06/29/2018 0317   Lipase  No  results found for: LIPASE     Studies/Results: No results found.  Anti-infectives: Anti-infectives (From admission, onward)   Start     Dose/Rate Route Frequency Ordered Stop   06/28/18 1800  vancomycin (VANCOCIN) IVPB 1000 mg/200 mL premix  Status:  Discontinued     1,000 mg 200 mL/hr over 60 Minutes Intravenous Every 24 hours 06/28/18 1402 06/30/18 1357   06/26/18 1800  vancomycin (VANCOCIN) 1,250 mg in sodium chloride 0.9 % 250 mL IVPB  Status:  Discontinued     1,250 mg 166.7 mL/hr over 90 Minutes Intravenous Every 24 hours 07/05/2018 1632 06/28/18 1402   06/26/2018 2100  cefTRIAXone (ROCEPHIN) 2 g in sodium chloride 0.9 % 100 mL IVPB     2 g 200 mL/hr over 30 Minutes Intravenous Every 24 hours 06/28/2018 1753     07/06/2018 1800  metroNIDAZOLE (FLAGYL) IVPB 500 mg     500 mg 100 mL/hr over 60 Minutes Intravenous Every 8 hours 07/07/2018 1753     07/09/2018 1630  vancomycin (VANCOCIN) 1,500 mg in sodium chloride 0.9 % 500 mL IVPB  Status:  Discontinued     1,500 mg 250  mL/hr over 120 Minutes Intravenous  Once 07/07/2018 1617 06/28/18 1402   06/22/2018 1515  vancomycin (VANCOCIN) IVPB 1000 mg/200 mL premix  Status:  Discontinued     1,000 mg 200 mL/hr over 60 Minutes Intravenous  Once 07/15/2018 1505 07/16/2018 1617   07/04/2018 1515  ceFEPIme (MAXIPIME) 2 g in sodium chloride 0.9 % 100 mL IVPB     2 g 200 mL/hr over 30 Minutes Intravenous  Once 07/13/2018 1505 07/11/2018 1637       Assessment/Plan Principal Problem: Infected decubitus ulcer, unspecified pressure ulcer stage Active Problems: HYPERCHOLESTEROLEMIA Dementia without behavioral disturbance (HCC) Essential hypertension Persistent atrial fibrillation CKD (chronic kidney disease) Chronic diastolic heart failure (HCC)  Sacral decubitus ulcer - can restart xarelto.  No plans for sharp surgical debridement while here. - PT hydrotherapy daily through the weekend  - santyl and BID wet to dry dressing - wound is cleaning  up with hydrotherapy. There is some exposed bone in the superior aspect of the wound.  By definition, exposed bone, is osteomyelitis.  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  Discussed with primary service.  UXY:BFXOV healthy ANV:BTYOMAY OK:HTXH, Flagyl & Rocephin 02/06 Follow up:TBD   LOS: 5 days    Letha Cape , [facility] 06/30/2018, 2:00 PM Pager: [phone]

## 2018-06-30 NOTE — Progress Notes (Signed)
Physical Therapy Wound Treatment Patient Details  Name: Kiira Brach MRN: [id number] Date of Birth: [birth year]  Today's Date: 06/30/2018 Time: 1761-6073 Time Calculation (min): 31 min  Subjective  Subjective: "Thank you," pt stated after we put a warm blanket on her. Patient and Family Stated Goals: none stated Date of Onset: (unknown) Prior Treatments: dressing change  Pain Score:  Premedicated. Grimacing with positioning.  Wound Assessment  Pressure Injury 06/26/18 Unstageable - Full thickness tissue loss in which the base of the ulcer is covered by slough (yellow, tan, gray, green or brown) and/or eschar (tan, brown or black) in the wound bed. Sacrum (Active)  Dressing Type Moist to dry;ABD;Other (Comment);Barrier Film (skin prep) 06/30/2018  2:01 PM  Dressing Changed;Clean;Dry;Intact 06/30/2018  2:01 PM  Dressing Change Frequency Twice a day 06/30/2018  2:01 PM  State of Healing Eschar 06/30/2018  2:01 PM  Site / Wound Assessment Black;Yellow;Red;Pink;Brown 06/30/2018  2:01 PM  % Wound base Red or Granulating 20% 06/30/2018  2:01 PM  % Wound base Yellow/Fibrinous Exudate 40% 06/30/2018  2:01 PM  % Wound base Black/Eschar 40% 06/30/2018  2:01 PM  % Wound base Other/Granulation Tissue (Comment) 0% 06/30/2018  2:01 PM  Peri-wound Assessment Erythema (blanchable);Pink 06/30/2018  2:01 PM  Wound Length (cm) 6 cm 06/27/2018  9:30 AM  Wound Width (cm) 7 cm 06/27/2018  9:30 AM  Wound Depth (cm) 4 cm 06/27/2018  9:30 AM  Wound Surface Area (cm^2) 42 cm^2 06/27/2018  9:30 AM  Wound Volume (cm^3) 168 cm^3 06/27/2018  9:30 AM  Undermining (cm) 4 cm from 12-6, 2 cm from 6-12 06/26/2018 10:00 AM  Margins Unattached edges (unapproximated) 06/30/2018  2:01 PM  Drainage Amount Moderate 06/30/2018  2:01 PM  Drainage Description Purulent 06/30/2018  2:01 PM  Treatment Debridement (Selective);Hydrotherapy (Pulse lavage);Packing (Saline gauze) 06/30/2018  2:01 PM   Santyl applied to wound bed prior to applying dressing.     Hydrotherapy Pulsed lavage therapy - wound location: sacrum Pulsed Lavage with Suction (psi): 12 psi(8-12) Pulsed Lavage with Suction - Normal Saline Used: 1000 mL Pulsed Lavage Tip: Tip with splash shield Selective Debridement Selective Debridement - Location: sacrum Selective Debridement - Tools Used: Forceps;Scissors Selective Debridement - Tissue Removed: Black, brown and yellow necrotic tissue   Wound Assessment and Plan  Wound Therapy - Assess/Plan/Recommendations Wound Therapy - Clinical Statement: Continue with significant removal of necrotic tissue. Continue hydrotherapy for removal of necrotic tissue and reduction of bioburden. Likely could benefit from Cleanse Choice VAC. Will contact surgery. Wound Therapy - Functional Problem List: decr mobility Factors Delaying/Impairing Wound Healing: Immobility;Multiple medical problems;Incontinence Hydrotherapy Plan: Debridement;Dressing change;Patient/family education;Pulsatile lavage with suction Wound Therapy - Frequency: 6X / week Wound Therapy - Follow Up Recommendations: Skilled nursing facility Wound Plan: see above  Wound Therapy Goals- Improve the function of patient's integumentary system by progressing the wound(s) through the phases of wound healing (inflammation - proliferation - remodeling) by: Decrease Necrotic Tissue to: 70 Decrease Necrotic Tissue - Progress: Progressing toward goal Increase Granulation Tissue to: 30 Increase Granulation Tissue - Progress: Progressing toward goal  Goals will be updated until maximal potential achieved or discharge criteria met.  Discharge criteria: when goals achieved, discharge from hospital, MD decision/surgical intervention, no progress towards goals, refusal/missing three consecutive treatments without notification or medical reason.  GP     Shary Decamp [place] 06/30/2018, 2:07 PM Reyn Faivre [place] Pager [phone] Office [phone]

## 2018-06-30 NOTE — Progress Notes (Addendum)
[facility] paged for patient manuel b/p with doppler systolic 68.  2005-[facility] called back. New order noted.  Rapid response made aware of patient's status. 0012-[facility] called back because b/p 77/52 after 500cc bolus was given. Mentation has not changed. New orders noted.  0511- IV team to bedside for new IV placement. Patient pulled out IV to right posterior arm. Mittens placed back on patient's hands. IVF have not been started yet due to IV being pulled out. Flagyl is on hold at present due to no IV. B/p continued to be monitored.

## 2018-07-01 ENCOUNTER — Inpatient Hospital Stay (HOSPITAL_COMMUNITY): Payer: Medicare Other

## 2018-07-01 ENCOUNTER — Encounter (HOSPITAL_COMMUNITY): Admission: EM | Disposition: E | Payer: Self-pay | Source: Skilled Nursing Facility | Attending: Internal Medicine

## 2018-07-01 DIAGNOSIS — L089 Local infection of the skin and subcutaneous tissue, unspecified: Secondary | ICD-10-CM | POA: Diagnosis not present

## 2018-07-01 DIAGNOSIS — L8990 Pressure ulcer of unspecified site, unspecified stage: Secondary | ICD-10-CM | POA: Diagnosis not present

## 2018-07-01 DIAGNOSIS — R578 Other shock: Secondary | ICD-10-CM | POA: Diagnosis not present

## 2018-07-01 DIAGNOSIS — L89150 Pressure ulcer of sacral region, unstageable: Secondary | ICD-10-CM | POA: Diagnosis not present

## 2018-07-01 DIAGNOSIS — A419 Sepsis, unspecified organism: Secondary | ICD-10-CM | POA: Diagnosis not present

## 2018-07-01 DIAGNOSIS — K922 Gastrointestinal hemorrhage, unspecified: Secondary | ICD-10-CM | POA: Diagnosis not present

## 2018-07-01 DIAGNOSIS — R571 Hypovolemic shock: Secondary | ICD-10-CM

## 2018-07-01 LAB — COMPREHENSIVE METABOLIC PANEL
ALT: 92 U/L — ABNORMAL HIGH (ref 0–44)
AST: 355 U/L — ABNORMAL HIGH (ref 15–41)
Albumin: 1.3 g/dL — ABNORMAL LOW (ref 3.5–5.0)
Alkaline Phosphatase: 64 U/L (ref 38–126)
Anion gap: 13 (ref 5–15)
BUN: 42 mg/dL — ABNORMAL HIGH (ref 8–23)
CO2: 14 mmol/L — ABNORMAL LOW (ref 22–32)
Calcium: 7.7 mg/dL — ABNORMAL LOW (ref 8.9–10.3)
Chloride: 116 mmol/L — ABNORMAL HIGH (ref 98–111)
Creatinine, Ser: 1.37 mg/dL — ABNORMAL HIGH (ref 0.44–1.00)
GFR calc Af Amer: 42 mL/min — ABNORMAL LOW (ref >60)
GFR calc non Af Amer: 37 mL/min — ABNORMAL LOW (ref >60)
Glucose, Bld: 155 mg/dL — ABNORMAL HIGH (ref 70–99)
Potassium: 4.3 mmol/L (ref 3.5–5.1)
SODIUM: 143 mmol/L (ref 135–145)
Total Bilirubin: 0.9 mg/dL (ref 0.3–1.2)
Total Protein: 4 g/dL — ABNORMAL LOW (ref 6.5–8.1)

## 2018-07-01 LAB — URINALYSIS, ROUTINE W REFLEX MICROSCOPIC
Bilirubin Urine: NEGATIVE
Glucose, UA: NEGATIVE mg/dL
Ketones, ur: NEGATIVE mg/dL
Nitrite: NEGATIVE
Protein, ur: 30 mg/dL — AB
Specific Gravity, Urine: 1.021 (ref 1.005–1.030)
pH: 5 (ref 5.0–8.0)

## 2018-07-01 LAB — DIC (DISSEMINATED INTRAVASCULAR COAGULATION)PANEL
D-Dimer, Quant: 1.66 ug/mL-FEU — ABNORMAL HIGH (ref 0.00–0.50)
Fibrinogen: 345 mg/dL (ref 210–475)
Prothrombin Time: 45.1 seconds — ABNORMAL HIGH (ref 11.4–15.2)
Smear Review: NONE SEEN
aPTT: 68 seconds — ABNORMAL HIGH (ref 24–36)

## 2018-07-01 LAB — PREPARE RBC (CROSSMATCH)

## 2018-07-01 LAB — BASIC METABOLIC PANEL
Anion gap: 13 (ref 5–15)
BUN: 46 mg/dL — ABNORMAL HIGH (ref 8–23)
CO2: 18 mmol/L — ABNORMAL LOW (ref 22–32)
Calcium: 8.6 mg/dL — ABNORMAL LOW (ref 8.9–10.3)
Chloride: 108 mmol/L (ref 98–111)
Creatinine, Ser: 1.54 mg/dL — ABNORMAL HIGH (ref 0.44–1.00)
GFR calc Af Amer: 37 mL/min — ABNORMAL LOW (ref >60)
GFR calc non Af Amer: 32 mL/min — ABNORMAL LOW (ref >60)
Glucose, Bld: 82 mg/dL (ref 70–99)
Potassium: 4.9 mmol/L (ref 3.5–5.1)
Sodium: 139 mmol/L (ref 135–145)

## 2018-07-01 LAB — HEPARIN LEVEL (UNFRACTIONATED): Heparin Unfractionated: 0.79 IU/mL — ABNORMAL HIGH (ref 0.30–0.70)

## 2018-07-01 LAB — BLOOD GAS, ARTERIAL
Acid-base deficit: 11.7 mmol/L — ABNORMAL HIGH (ref 0.0–2.0)
Bicarbonate: 13.5 mmol/L — ABNORMAL LOW (ref 20.0–28.0)
Drawn by: 511911
FIO2: 21
O2 SAT: 96.9 %
Patient temperature: 97.6
pCO2 arterial: 27.6 mmHg — ABNORMAL LOW (ref 32.0–48.0)
pH, Arterial: 7.308 — ABNORMAL LOW (ref 7.350–7.450)
pO2, Arterial: 93.4 mmHg (ref 83.0–108.0)

## 2018-07-01 LAB — CBC
HCT: 28.1 % — ABNORMAL LOW (ref 36.0–46.0)
HCT: 34.6 % — ABNORMAL LOW (ref 36.0–46.0)
HCT: 37 % (ref 36.0–46.0)
Hemoglobin: 9.1 g/dL — ABNORMAL LOW (ref 12.0–15.0)
Hemoglobin: 11.5 g/dL — ABNORMAL LOW (ref 12.0–15.0)
Hemoglobin: 12.2 g/dL (ref 12.0–15.0)
MCH: 30.4 pg (ref 26.0–34.0)
MCH: 30.8 pg (ref 26.0–34.0)
MCH: 30.4 pg (ref 26.0–34.0)
MCHC: 32.4 g/dL (ref 30.0–36.0)
MCHC: 33.2 g/dL (ref 30.0–36.0)
MCHC: 33 g/dL (ref 30.0–36.0)
MCV: 94 fL (ref 80.0–100.0)
MCV: 92.8 fL (ref 80.0–100.0)
MCV: 92.3 fL (ref 80.0–100.0)
Platelets: 200 10*3/uL (ref 150–400)
Platelets: 162 10*3/uL (ref 150–400)
Platelets: 174 10*3/uL (ref 150–400)
RBC: 2.99 MIL/uL — ABNORMAL LOW (ref 3.87–5.11)
RBC: 3.73 MIL/uL — ABNORMAL LOW (ref 3.87–5.11)
RBC: 4.01 MIL/uL (ref 3.87–5.11)
RDW: 19.7 % — ABNORMAL HIGH (ref 11.5–15.5)
RDW: 16.2 % — ABNORMAL HIGH (ref 11.5–15.5)
RDW: 18.5 % — ABNORMAL HIGH (ref 11.5–15.5)
WBC: 31.2 10*3/uL — AB (ref 4.0–10.5)
WBC: 33 10*3/uL — ABNORMAL HIGH (ref 4.0–10.5)
WBC: 34.2 10*3/uL — ABNORMAL HIGH (ref 4.0–10.5)
nRBC: 5.9 % — ABNORMAL HIGH (ref 0.0–0.2)
nRBC: 7 % — ABNORMAL HIGH (ref 0.0–0.2)
nRBC: 7.5 % — ABNORMAL HIGH (ref 0.0–0.2)

## 2018-07-01 LAB — POCT I-STAT 7, (LYTES, BLD GAS, ICA,H+H)
Acid-base deficit: 7 mmol/L — ABNORMAL HIGH (ref 0.0–2.0)
Bicarbonate: 18 mmol/L — ABNORMAL LOW (ref 20.0–28.0)
Calcium, Ion: 1.27 mmol/L (ref 1.15–1.40)
HCT: 31 % — ABNORMAL LOW (ref 36.0–46.0)
Hemoglobin: 10.5 g/dL — ABNORMAL LOW (ref 12.0–15.0)
O2 Saturation: 100 %
PH ART: 7.361 (ref 7.350–7.450)
PO2 ART: 503 mmHg — AB (ref 83.0–108.0)
Potassium: 3.9 mmol/L (ref 3.5–5.1)
Sodium: 144 mmol/L (ref 135–145)
TCO2: 19 mmol/L — ABNORMAL LOW (ref 22–32)
pCO2 arterial: 31.8 mmHg — ABNORMAL LOW (ref 32.0–48.0)

## 2018-07-01 LAB — LACTIC ACID, PLASMA
LACTIC ACID, VENOUS: 8 mmol/L — AB (ref 0.5–1.9)
Lactic Acid, Venous: 8.3 mmol/L (ref 0.5–1.9)
Lactic Acid, Venous: 6.7 mmol/L (ref 0.5–1.9)
Lactic Acid, Venous: 6 mmol/L (ref 0.5–1.9)
Lactic Acid, Venous: 3.8 mmol/L (ref 0.5–1.9)

## 2018-07-01 LAB — VANCOMYCIN, RANDOM: Vancomycin Rm: 16

## 2018-07-01 LAB — GLUCOSE, CAPILLARY
Glucose-Capillary: 72 mg/dL (ref 70–99)
Glucose-Capillary: 102 mg/dL — ABNORMAL HIGH (ref 70–99)

## 2018-07-01 LAB — MAGNESIUM: Magnesium: 1.7 mg/dL (ref 1.7–2.4)

## 2018-07-01 LAB — SEDIMENTATION RATE: Sed Rate: 25 mm/hr — ABNORMAL HIGH (ref 0–22)

## 2018-07-01 LAB — DIC (DISSEMINATED INTRAVASCULAR COAGULATION) PANEL
INR: 4.93 — AB
PLATELETS: 157 10*3/uL (ref 150–400)

## 2018-07-01 LAB — ABO/RH: ABO/RH(D): O POS

## 2018-07-01 SURGERY — EGD (ESOPHAGOGASTRODUODENOSCOPY)
Anesthesia: Moderate Sedation

## 2018-07-01 MED ORDER — MAGNESIUM SULFATE 2 GM/50ML IV SOLN
2.0000 g | Freq: Once | INTRAVENOUS | Status: AC
  Administered 2018-07-01: 2 g via INTRAVENOUS
  Filled 2018-07-01: qty 50

## 2018-07-01 MED ORDER — ROCURONIUM BROMIDE 50 MG/5ML IV SOLN
1.0000 mg/kg | Freq: Once | INTRAVENOUS | Status: AC
  Administered 2018-07-01: 72 mg via INTRAVENOUS
  Filled 2018-07-01: qty 7.2

## 2018-07-01 MED ORDER — SODIUM CHLORIDE 0.9 % IV SOLN
INTRAVENOUS | Status: DC
  Administered 2018-07-01 (×2): via INTRAVENOUS

## 2018-07-01 MED ORDER — SODIUM CHLORIDE 0.9 % IV BOLUS
500.0000 mL | Freq: Once | INTRAVENOUS | Status: AC
  Administered 2018-07-01: 500 mL via INTRAVENOUS

## 2018-07-01 MED ORDER — NOREPINEPHRINE 4 MG/250ML-% IV SOLN
0.0000 ug/min | INTRAVENOUS | Status: DC
  Administered 2018-07-01: 11 ug/min via INTRAVENOUS
  Administered 2018-07-01: 10 ug/min via INTRAVENOUS
  Filled 2018-07-01 (×2): qty 250

## 2018-07-01 MED ORDER — SODIUM BICARBONATE 8.4 % IV SOLN
INTRAVENOUS | Status: AC
  Administered 2018-07-01: 50 meq via INTRAVENOUS
  Filled 2018-07-01: qty 50

## 2018-07-01 MED ORDER — MIDAZOLAM HCL 2 MG/2ML IJ SOLN
INTRAMUSCULAR | Status: AC
  Administered 2018-07-01: 2 mg via INTRAVENOUS
  Filled 2018-07-01: qty 4

## 2018-07-01 MED ORDER — MIDAZOLAM HCL 2 MG/2ML IJ SOLN
2.0000 mg | Freq: Once | INTRAMUSCULAR | Status: AC
  Administered 2018-07-01: 2 mg via INTRAVENOUS

## 2018-07-01 MED ORDER — SODIUM CHLORIDE 0.9% IV SOLUTION: Freq: Once | INTRAVENOUS | Status: DC

## 2018-07-01 MED ORDER — VANCOMYCIN HCL IN DEXTROSE 1-5 GM/200ML-% IV SOLN
1000.0000 mg | Freq: Once | INTRAVENOUS | Status: AC
  Administered 2018-07-01: 1000 mg via INTRAVENOUS
  Filled 2018-07-01: qty 200

## 2018-07-01 MED ORDER — SODIUM CHLORIDE 0.9 % IV SOLN
1.0000 g | Freq: Two times a day (BID) | INTRAVENOUS | Status: DC
  Administered 2018-07-01 (×2): 1 g via INTRAVENOUS
  Filled 2018-07-01 (×3): qty 1

## 2018-07-01 MED ORDER — PANTOPRAZOLE SODIUM 40 MG IV SOLR
40.0000 mg | Freq: Two times a day (BID) | INTRAVENOUS | Status: DC
  Administered 2018-07-01 (×2): 40 mg via INTRAVENOUS
  Filled 2018-07-01 (×2): qty 40

## 2018-07-01 MED ORDER — FENTANYL CITRATE (PF) 100 MCG/2ML IJ SOLN
100.0000 ug | Freq: Once | INTRAMUSCULAR | Status: AC
  Administered 2018-07-01: 100 ug via INTRAVENOUS

## 2018-07-01 MED ORDER — FENTANYL CITRATE (PF) 100 MCG/2ML IJ SOLN
50.0000 ug | Freq: Once | INTRAMUSCULAR | Status: AC
  Administered 2018-07-02: 50 ug via INTRAVENOUS

## 2018-07-01 MED ORDER — FENTANYL 2500MCG IN NS 250ML (10MCG/ML) PREMIX INFUSION
25.0000 ug/h | INTRAVENOUS | Status: DC
  Administered 2018-07-01: 50 ug/h via INTRAVENOUS
  Administered 2018-07-02: 100 ug/h via INTRAVENOUS
  Filled 2018-07-01 (×3): qty 250

## 2018-07-01 MED ORDER — SODIUM CHLORIDE 0.9 % IV BOLUS
1000.0000 mL | Freq: Once | INTRAVENOUS | Status: AC
  Administered 2018-07-01: 1000 mL via INTRAVENOUS

## 2018-07-01 MED ORDER — PROTAMINE SULFATE 10 MG/ML IV SOLN
30.0000 mg | Freq: Once | INTRAVENOUS | Status: AC
  Administered 2018-07-01: 30 mg via INTRAVENOUS
  Filled 2018-07-01: qty 3

## 2018-07-01 MED ORDER — ETOMIDATE 2 MG/ML IV SOLN
20.0000 mg | Freq: Once | INTRAVENOUS | Status: AC
  Administered 2018-07-01: 20 mg via INTRAVENOUS

## 2018-07-01 MED ORDER — EPINEPHRINE PF 1 MG/10ML IJ SOSY
PREFILLED_SYRINGE | INTRAMUSCULAR | Status: AC
  Filled 2018-07-01: qty 10

## 2018-07-01 MED ORDER — PHENYLEPHRINE HCL-NACL 10-0.9 MG/250ML-% IV SOLN
0.0000 ug/min | INTRAVENOUS | Status: DC
  Administered 2018-07-01 (×3): 200 ug/min via INTRAVENOUS
  Administered 2018-07-01: 100 ug/min via INTRAVENOUS
  Administered 2018-07-01: 200 ug/min via INTRAVENOUS
  Administered 2018-07-01: 13.333 ug/min via INTRAVENOUS
  Administered 2018-07-01: 100 ug/min via INTRAVENOUS
  Filled 2018-07-01 (×11): qty 250

## 2018-07-01 MED ORDER — FENTANYL CITRATE (PF) 100 MCG/2ML IJ SOLN
INTRAMUSCULAR | Status: AC
  Filled 2018-07-01: qty 2

## 2018-07-01 MED ORDER — SODIUM BICARBONATE 8.4 % IV SOLN
50.0000 meq | Freq: Once | INTRAVENOUS | Status: AC
  Administered 2018-07-01: 50 meq via INTRAVENOUS

## 2018-07-01 MED ORDER — SODIUM BICARBONATE 8.4 % IV SOLN
100.0000 meq | Freq: Once | INTRAVENOUS | Status: DC
  Administered 2018-07-01: 100 meq via INTRAVENOUS

## 2018-07-01 MED ORDER — VANCOMYCIN VARIABLE DOSE PER UNSTABLE RENAL FUNCTION (PHARMACIST DOSING): Status: DC

## 2018-07-01 MED ORDER — LACTATED RINGERS IV BOLUS
1000.0000 mL | Freq: Once | INTRAVENOUS | Status: AC
  Administered 2018-07-01: 1000 mL via INTRAVENOUS

## 2018-07-01 MED ORDER — FENTANYL BOLUS VIA INFUSION
25.0000 ug | INTRAVENOUS | Status: DC | PRN
  Administered 2018-07-02: 25 ug via INTRAVENOUS
  Filled 2018-07-01: qty 25

## 2018-07-01 MED ORDER — VANCOMYCIN HCL IN DEXTROSE 1-5 GM/200ML-% IV SOLN
1000.0000 mg | Freq: Once | INTRAVENOUS | Status: DC
  Filled 2018-07-01: qty 200

## 2018-07-01 MED FILL — Medication: Qty: 1 | Status: AC

## 2018-07-01 NOTE — Procedures (Addendum)
Intubation Procedure Note Zoye Chandra [id number] 20-Jan-1941  Procedure: Intubation Indications: Airway protection and maintenance  Procedure Details Consent: Risks of procedure as well as the alternatives and risks of each were explained to the (patient/caregiver).  Consent for procedure obtained. Time Out: Verified patient identification, verified procedure, site/side was marked, verified correct patient position, special equipment/implants available, medications/allergies/relevent history reviewed, required imaging and test results available.  Performed  MAC and 3 Medications:  Fentanyl 100 mcg Etomidate 20 mg Versed 69m NMB rocuronium 50 mg      Evaluation Hemodynamic Status: Persistent hypotension treated with pressors and fluid; O2 sats: stable throughout Patient's Current Condition: stable Complications: No apparent complications Patient did tolerate procedure well. Chest X-ray ordered to verify placement.  CXR: not needed   SSt. Elizabeth Ft. ThomasMinor ACNP LMaryanna ShapePCCM Pager [phone] 3 pm If no answer page [phone] 16412/04/2019, 10:25 AM

## 2018-07-01 NOTE — Significant Event (Signed)
Rapid Response Event Note  Overview:  Regina Sandoval PCCM NP called requesting assistance. SBP 60-70's.     Initial Focused Assessment: On arrival pt lying supine in bed, skin warm and dry, able to arouse with persistent stimulation but unable to keep awake to engage in conversation.  Pt noted to have acute onset of bright red blood per rectum.   Interventions: Emergency release of 2 units PRBC's  22 g IV LH 1 L NS Bolus  Transferred to 103m03   Event Summary: Start time 0706 End time   0930    at          [facility], [name]

## 2018-07-01 NOTE — Progress Notes (Addendum)
PIV consult: RN reports they established a second site. Cancel consult.

## 2018-07-01 NOTE — Progress Notes (Signed)
[facility] Progress Note     Subjective: CC-  Over night events noted. Patient significantly hypotensive. Lactic acid 8.3. she has been given IVF boluses, pan-cultured, and antibiotics broadened, merrem/vancomycin/flagyl. Patient being transferred to ICU.  Objective: Vital signs in last 24 hours: Temp:  [96.3 F (35.7 C)-98.6 F (37 C)] 96.3 F (35.7 C) (02/12 0820) Pulse Rate:  [64-94] 64 (02/12 0820) Resp:  [16-19] 18 (02/12 0820) BP: (70-99)/(48-75) 91/60 (02/12 0820) SpO2:  [96 %-100 %] 100 % (02/12 0820) Last BM Date: 06/30/18  Intake/Output from previous day: 02/11 0701 - 02/12 0700 In: 2053.4 [P.O.:180; I.V.:468.4; IV Piggyback:1405.1] Out: 50 [Urine:50] Intake/Output this shift: No intake/output data recorded.  PE: Gen:  Alert, NAD HEENT: EOM's intact, pupils equal and round Card:  tachy Pulm:  effort normal GU: wound with necrotic base and exposed bone, no purulent drainage noted. Patient began having frank blood per rectum during dressing change     Lab Results:  Recent Labs    06/30/18 0402 07/09/2018 0231  WBC 24.0* 31.2*  HGB 10.8* 9.1*  HCT 32.3* 28.1*  PLT 214 200   BMET Recent Labs    06/29/18 0317 07/08/2018 0231  NA 139 139  K 3.3* 4.9  CL 108 108  CO2 19* 18*  GLUCOSE 96 82  BUN 40* 46*  CREATININE 1.08* 1.54*  CALCIUM 8.4* 8.6*   PT/INR No results for input(s): LABPROT, INR in the last 72 hours. CMP     Component Value Date/Time   NA 139 07/13/2018 0231   NA 142 06/24/2018 0245   K 4.9 07/07/2018 0231   CL 108 07/11/2018 0231   CO2 18 (L) 07/05/2018 0231   GLUCOSE 82 07/16/2018 0231   BUN 46 (H) 07/15/2018 0231   BUN 40 (A) 06/24/2018 0245   CREATININE 1.54 (H) 07/05/2018 0231   CALCIUM 8.6 (L) 07/15/2018 0231   PROT 7.4 06/29/2018 1430   PROT 7.0 12/24/2016 0849   ALBUMIN 2.0 (L) 06/22/2018 1430   ALBUMIN 3.8 12/24/2016 0849   AST 53 (H) 07/10/2018 1430   ALT 52 (H) 06/22/2018 1430   ALKPHOS 123  06/27/2018 1430   BILITOT 1.2 06/29/2018 1430   BILITOT 0.6 12/24/2016 0849   GFRNONAA 32 (L) 06/26/2018 0231   GFRAA 37 (L) 07/06/2018 0231   Lipase  No results found for: LIPASE     Studies/Results: No results found.  Anti-infectives: Anti-infectives (From admission, onward)   Start     Dose/Rate Route Frequency Ordered Stop   06/29/2018 0745  meropenem (MERREM) 1 g in sodium chloride 0.9 % 100 mL IVPB     1 g 200 mL/hr over 30 Minutes Intravenous Every 12 hours 07/08/2018 0734     07/08/2018 0730  vancomycin (VANCOCIN) IVPB 1000 mg/200 mL premix     1,000 mg 200 mL/hr over 60 Minutes Intravenous  Once 06/29/2018 0723     06/28/18 1800  vancomycin (VANCOCIN) IVPB 1000 mg/200 mL premix  Status:  Discontinued     1,000 mg 200 mL/hr over 60 Minutes Intravenous Every 24 hours 06/28/18 1402 06/30/18 1357   06/26/18 1800  vancomycin (VANCOCIN) 1,250 mg in sodium chloride 0.9 % 250 mL IVPB  Status:  Discontinued     1,250 mg 166.7 mL/hr over 90 Minutes Intravenous Every 24 hours 06/30/2018 1632 06/28/18 1402   07/07/2018 2100  cefTRIAXone (ROCEPHIN) 2 g in sodium chloride 0.9 % 100 mL IVPB  Status:  Discontinued     2 g 200 mL/hr  over 30 Minutes Intravenous Every 24 hours 07/06/18 1753 07/07/2018 0715   07/06/2018 1800  metroNIDAZOLE (FLAGYL) IVPB 500 mg     500 mg 100 mL/hr over 60 Minutes Intravenous Every 8 hours 07/06/2018 1753     2018/07/06 1630  vancomycin (VANCOCIN) 1,500 mg in sodium chloride 0.9 % 500 mL IVPB  Status:  Discontinued     1,500 mg 250 mL/hr over 120 Minutes Intravenous  Once 07-06-18 1617 06/28/18 1402   2018-07-06 1515  vancomycin (VANCOCIN) IVPB 1000 mg/200 mL premix  Status:  Discontinued     1,000 mg 200 mL/hr over 60 Minutes Intravenous  Once 06-Jul-2018 1505 Jul 06, 2018 1617   2018/07/06 1515  ceFEPIme (MAXIPIME) 2 g in sodium chloride 0.9 % 100 mL IVPB     2 g 200 mL/hr over 30 Minutes Intravenous  Once 07-06-2018 1505 06-Jul-2018 1637       Assessment/Plan Persistent atrial  fibrillation Chronic diastolic heart failure Dementia HLD HTN COPD Bed/wheelchair bound Severe malnutrition  Hypotension, Lactic acidosis - per primary/CCM DNR-L  Sacral decubitus ulcer - PT hydrotherapy daily through the weekend  - santyl and BID wet to dry dressing  JDB:ZMCEYEMVV 3 diet VTE:IV heparin - holding due to bleeding KP:QAES, Flagyl & Rocephin 02/06>>2/12, merrem/vancomycin/flagyl 2/12>> Follow up:TBD  Plan - Patient's decline does not appear secondary to her sacral wound, it is cleaning up well with hydrotherapy and dressing changes. No purulent drainage noted on dressing change. No plans for sharp surgical debridement while here. Would continue current plan of care.  Hypotension/lactic acidosis management and workup per primary/CCM.   LOS: 6 days    Franne Forts , [facility] 06/30/2018, 8:47 AM Pager: [phone] Mon-Thurs 7:00 am-4:30 pm Fri 7:00 am -11:30 AM Sat-Sun 7:00 am-11:30 am

## 2018-07-01 NOTE — Consult Note (Addendum)
[facility] Consult: 9:44 AM 07/06/2018  LOS: 6 days    Referring Provider: Dr. Jerral Ralph Primary Care Physician:  Pecola Lawless, MD Primary Gastroenterologist:  unassigned     Reason for Consultation: GI bleed.  Passing lots of blood per rectum.   HPI: Regina Sandoval is a 78 y.o. female.  Patient is a wheelchair-bound nursing home resident.  She has multiple problems mentioned below.  On chronic Xarelto.  No epic records of previous colonoscopy or upper endoscopy.  No PPI at home.  Previous alcohol abuse  Admitted almost a week ago with infected sacral decubitus.  As there was a possibility she may require surgical debridement, her Xarelto was held and she was started on IV heparin. Overnight the patient developed hypotension .  Within the last few hours she is began passing large amounts of blood from her rectum.  3 episodes of red and dark blood so far, last ~ 930 this AM.  She was transferred to the ICU this morning.  Blood transfusion initiated after receiving IVF bolus.  IV heparin discontinued ~ 9 this a.m.  Patient is limited DNR however the patient's daughter request that we continue aggressive medical treatment but no CPR or intubation. During placement of central line to right groin, went into cardiac arrest, required chest compression and intubation.    Hgb at 230 this morning was 9.1, 10.8 24 hours prior.  Readings at admission varied between 12 and 14.  MCV normal. Positive AKI with parallel increase of both BUN and creatinine. There is no recent PT/INR/PTT.  Heparin level elevated early this morning. Twice daily IV Protonix ordered, has not received first dose yet Received Protamine to reverse Heparin.   Currently  BPs in 90s/70s.        Past Medical History:  Diagnosis Date  . Alcohol  dependence in remission (HCC)   . Arthritis   . Bradycardia   . Cataract   . Chronic anticoagulation    Xarelto  . Chronic diastolic heart failure (HCC)   . CKD (chronic kidney disease), stage III (HCC)   . CVA (cerebral infarction)   . Dementia (HCC)   . Edema   . Gout   . Hyperlipidemia   . Hypertension   . Murmur   . Osteoarthrosis, generalized, involving multiple sites    INVOLVING RIGHT HIP ,KNEE. AND ANKLE  . Persistent atrial fibrillation    a. Dx 04/2016, on anticoag, not felt to be a candidate for amiodarone or cardioversion.  . Seizure disorder (HCC)   . Subclinical hyperthyroidism   . Vitamin D deficiency     History reviewed. No pertinent surgical history.  Prior to Admission medications   Medication Sig Start Date End Date Taking? Authorizing Provider  acetaminophen (TYLENOL) 500 MG tablet Take 500 mg by mouth 2 (two) times daily as needed (pain).    Yes [provider]  acetaminophen (TYLENOL) 500 MG tablet Take 500 mg by mouth every 8 (eight) hours.   Yes [provider]  allopurinol (ZYLOPRIM) 300 MG tablet Take 300 mg by  mouth daily.   Yes [provider]  Amino Acids-Protein Hydrolys (FEEDING SUPPLEMENT, PRO-STAT SUGAR FREE 64,) LIQD Take 30 mLs by mouth 2 (two) times daily.   Yes [provider]  atorvastatin (LIPITOR) 40 MG tablet Take 40 mg by mouth daily at 6 PM.    Yes [provider]  bisacodyl (DULCOLAX) 10 MG suppository Place 10 mg rectally as needed for moderate constipation.   Yes [provider]  Cholecalciferol (VITAMIN D3) 50000 units CAPS Take 1 capsule by mouth every 30 (thirty) days.   Yes [provider]  diltiazem (CARDIZEM) 90 MG tablet Take 90 mg by mouth 2 (two) times daily.   Yes [provider]  donepezil (ARICEPT) 10 MG tablet Take 10 mg by mouth at bedtime.    Yes [provider]  fluticasone (FLONASE) 50 MCG/ACT nasal spray Place 1 spray into both nostrils  daily as needed for allergies.    Yes [provider]  furosemide (LASIX) 80 MG tablet Take 80 mg by mouth daily.    Yes [provider]  guaiFENesin (ROBITUSSIN) 100 MG/5ML SOLN Take 10 mLs by mouth 3 (three) times daily. For 1 week 06/24/2018 06/20/2018 Yes [provider]  hydroxypropyl methylcellulose / hypromellose (ISOPTO TEARS / GONIOVISC) 2.5 % ophthalmic solution Place 2 drops into both eyes 2 (two) times daily.   Yes [provider]  ipratropium-albuterol (DUONEB) 0.5-2.5 (3) MG/3ML SOLN Take 3 mLs by nebulization 4 (four) times daily as needed (sob or wheezing).    Yes [provider]  magnesium hydroxide (MILK OF MAGNESIA) 400 MG/5ML suspension Take by mouth daily as needed for mild constipation.   Yes [provider]  metoprolol (TOPROL-XL) 200 MG 24 hr tablet Take 200 mg by mouth daily.    Yes [provider]  Multiple Vitamins-Minerals (MULTIVITAMIN WITH MINERALS) tablet Take 1 tablet by mouth daily.   Yes [provider]  NUTRITIONAL SUPPLEMENT LIQD Take 120 mLs by mouth 2 (two) times daily. Med[employer]    Yes [provider]  rivaroxaban (XARELTO) 20 MG TABS tablet Take 20 mg by mouth daily with supper.    Yes [provider]  saccharomyces boulardii (FLORASTOR) 250 MG capsule Take 250 mg by mouth 2 (two) times daily. 06/19/18 06/30/2018 Yes [provider]  traMADol (ULTRAM) 50 MG tablet Take 1 tablet (50 mg total) by mouth every 12 (twelve) hours as needed for moderate pain. 06/18/18  Yes Medina-Vargas, Monina C, NP    Scheduled Meds: . sodium chloride   Intravenous Once  . allopurinol  300 mg Oral Daily  . atorvastatin  40 mg Oral q1800  . collagenase   Topical BID  . donepezil  10 mg Oral QHS  . etomidate  20 mg Intravenous Once  . feeding supplement (ENSURE ENLIVE)  237 mL Oral BID BM  . feeding supplement (PRO-STAT SUGAR FREE 64)  30 mL Oral BID  . fentaNYL      . fentaNYL (SUBLIMAZE)  injection  100 mcg Intravenous Once  . mouth rinse  15 mL Mouth Rinse BID  . midazolam      . midazolam  2 mg Intravenous Once  . multivitamin with minerals  1 tablet Oral Daily  . pantoprazole (PROTONIX) IV  40 mg Intravenous Q12H  . polyethylene glycol  17 g Oral Daily  . polyvinyl alcohol  2 drop Both Eyes BID  . protamine  30 mg Intravenous Once  . rocuronium  1 mg/kg Intravenous Once  .  saccharomyces boulardii  250 mg Oral BID  . sodium bicarbonate      . sodium bicarbonate  100 mEq Intravenous Once  . Vitamin D (Ergocalciferol)  50,000 Units Oral Q30 days   Infusions: . sodium chloride 75 mL/hr at 2018/07/18 0727  . magnesium sulfate 1 - 4 g bolus IVPB    . meropenem (MERREM) IV 1 g (07-18-18 0748)  . metronidazole 100 mL/hr at Jul 18, 2018 0351  . vancomycin     PRN Meds: acetaminophen **OR** acetaminophen, albuterol, bisacodyl, fluticasone, guaiFENesin, ondansetron **OR** ondansetron (ZOFRAN) IV   Allergies as of 07/10/2018 - Review Complete 07/10/2018  Allergen Reaction Noted  . Penicillins Hives 05/15/2010    Family History  Problem Relation Age of Onset  . Asthma Grandchild   . Hypertension Father   . Diabetes Daughter     Social History   Socioeconomic History  . Marital status: Divorced    Spouse name: Not on file  . Number of children: Not on file  . Years of education: Not on file  . Highest education level: Not on file  Occupational History  . Not on file  Social Needs  . Financial resource strain: Not hard at all  . Food insecurity:    Worry: Never true    Inability: Never true  . Transportation needs:    Medical: No    Non-medical: No  Tobacco Use  . Smoking status: Former Smoker    Packs/day: 1.00    Years: 50.00    Pack years: 50.00    Types: Cigarettes  . Smokeless tobacco: Never Used  Substance and Sexual Activity  . Alcohol use: No    Alcohol/week: 0.0 standard drinks  . Drug use: No  . Sexual activity: Not on file  Lifestyle  .  Physical activity:    Days per week: 0 days    Minutes per session: 0 min  . Stress: Only a little  Relationships  . Social connections:    Talks on phone: Once a week    Gets together: Once a week    Attends religious service: Never    Active member of club or organization: No    Attends meetings of clubs or organizations: Never    Relationship status: Divorced  . Intimate partner violence:    Fear of current or ex partner: No    Emotionally abused: No    Physically abused: No    Forced sexual activity: No  Other Topics Concern  . Not on file  Social History Narrative   Lived with daughter at home prior to SNF placement    REVIEW OF SYSTEMS: Unable to obtain ROS in sedated, intubated pt.     PHYSICAL EXAM: Vital signs in last 24 hours: Vitals:   2018/07/18 0820 2018-07-18 0845  BP: 91/60 98/84  Pulse: 64 99  Resp: 18 18  Temp: (!) 96.3 F (35.7 C) (!) 97.4 F (36.3 C)  SpO2: 100% 100%   Wt Readings from Last 3 Encounters:  06/30/2018 72 kg  06/24/18 72.1 kg  06/23/18 72.1 kg    General: obese, sedated.  intubated Head:  No signs of trauma  Eyes:  + conj pallor Ears:  Not HOH  Nose:  No congestion or discharge Mouth:  Moist, clear,  No blood Neck:  No JVD Lungs:  ronchi bil.   Heart: Irreg, a fib on monitor Abdomen: soft, NT, obese, no mass or HSM   Rectal: no DRE.  Red blood on used laudry.  Musc/Skeltl: no joint contractures Extremities:  Very dry, scaling skin on legs with bandaged ulcers on shins.  Mild, non-pitting edema  Neurologic:  Sedated on vent Skin:  Dry skin.   Tattoos:  None    Intake/Output from previous day: 02/11 0701 - 02/12 0700 In: 2053.4 [P.O.:180; I.V.:468.4; IV Piggyback:1405.1] Out: 50 [Urine:50] Intake/Output this shift: No intake/output data recorded.  LAB RESULTS: Recent Labs    06/29/18 0317 06/30/18 0402 06/26/2018 0231  WBC 23.3* 24.0* 31.2*  HGB 10.8* 10.8* 9.1*  HCT 34.0* 32.3* 28.1*  PLT 183 214 200    BMET Lab Results  Component Value Date   NA 139 07/14/2018   NA 139 06/29/2018   NA 138 06/28/2018   K 4.9 06/20/2018   K 3.3 (L) 06/29/2018   K 3.6 06/28/2018   CL 108 07/10/2018   CL 108 06/29/2018   CL 108 06/28/2018   CO2 18 (L) 07/07/2018   CO2 19 (L) 06/29/2018   CO2 17 (L) 06/28/2018   GLUCOSE 82 06/30/2018   GLUCOSE 96 06/29/2018   GLUCOSE 85 06/28/2018   BUN 46 (H) 07/10/2018   BUN 40 (H) 06/29/2018   BUN 34 (H) 06/28/2018   CREATININE 1.54 (H) 06/23/2018   CREATININE 1.08 (H) 06/29/2018   CREATININE 0.96 06/28/2018   CALCIUM 8.6 (L) 06/28/2018   CALCIUM 8.4 (L) 06/29/2018   CALCIUM 8.4 (L) 06/28/2018   LFT No results for input(s): PROT, ALBUMIN, AST, ALT, ALKPHOS, BILITOT, BILIDIR, IBILI in the last 72 hours. PT/INR No results found for: INR, PROTIME  RADIOLOGY STUDIES: No results found.    IMPRESSION:   *   GI bleed.  Question upper versus lower GI bleed.  *     Hypotension.   *     Blood loss anemia.  PRBCs x2 units ordered.  *     Chronic anticoagulation.  Eliquis PTA.  Heparin since admission, now discontinued this morning. Reversed with protamine  *     Advanced sacral decubitus with exposed bone.,  On antibiotics for wound infection.    PLAN:     *   Place ogt or ngt and lavage to see if there is blood present in stomach.  Stat CBC now.  Possible EGD this afternoon.     Jennye MoccasinSarah Butch Otterson  06/21/2018, 9:44 AM Phone [phone]

## 2018-07-01 NOTE — Care Plan (Signed)
Readdressed goals of care after discussing patient's case with GI. Overall patient's prognosis is grim and ability to perform EGD would be associated with increased risk in setting of recent cardiac arrest, coagulapathy and hemodynamic instability. Daughter has also discussed this with GI. At this point, she requests to await additional family with plan to withdraw care.    Plan: NO escalation of care Transition to comfort care when family ready  Mechele Collin, M.D. [facility] Pulmonary/Critical Care Medicine Pager: [phone] After hours pager: [phone]

## 2018-07-01 NOTE — Progress Notes (Addendum)
First unit of blood started at 0830. Verbal order given for emergency release of 2 units PRBC's. Blood 2 RN verified.  Rate started at 131ml/hr.

## 2018-07-01 NOTE — Significant Event (Signed)
Called to patient bedside for progressive hypotension. Previous Provider Had Ordered 500 ml x 2 of NS with improvement of BP to 99/75. At 0300 patient with continued hypotension, LA 8.3. ABG 7.308/27.6/93.4. WBC increased from 24 to 31.2. PAN Cultured. Patient remains without respiratory distress on room air. Given 1L NS Bolus.   At [id number] Bedside Nurse Notified that BP 81/67. On assessment patient arouses however lethargic. Additional 500 ml bolus ordered. Repeat LA 8.0. Bicarb 13.5. Given 50 meq bicarb. PCCM consulted. Dr. Dewayne Shorter notified of patient condition.   Spoke to patient daughter who would like to continue with aggressive medical treatment however patient would not like to undergo CPR or Intubation. Code Status updated in EMR to DNR-L.

## 2018-07-01 NOTE — Progress Notes (Signed)
CRITICAL VALUE ALERT  Critical Value:  Lactic acid 8.3  Date & Time Notified:  06/22/2018 0259  Provider Notified:  Idolina PrimerK. Eubanks  Orders Received/Actions taken:  NS 1,000cc bolus, repeat LA at 0800 and ABG

## 2018-07-01 NOTE — Progress Notes (Signed)
Upon turning patient for dressing change with surgery, Bright red bowel movement noted with clots. Dr. Everardo All at bed side. Verbal order received from Dr. Everardo All for 2 units of emergent blood. Blood bank called to get units ready.

## 2018-07-01 NOTE — Plan of Care (Signed)
Patient now intubated, requiring multiple pressors. Plan: withdrawal of care later this evening or tonight.    Problem: Education: Goal: Knowledge of General Education information will improve Description Including pain rating scale, medication(s)/side effects and non-pharmacologic comfort measures Outcome: Not Progressing   Problem: Health Behavior/Discharge Planning: Goal: Ability to manage health-related needs will improve Outcome: Not Progressing   Problem: Clinical Measurements: Goal: Ability to maintain clinical measurements within normal limits will improve Outcome: Not Progressing Goal: Will remain free from infection Outcome: Not Progressing Goal: Diagnostic test results will improve Outcome: Not Progressing Goal: Respiratory complications will improve Outcome: Not Progressing Goal: Cardiovascular complication will be avoided Outcome: Not Progressing   Problem: Activity: Goal: Risk for activity intolerance will decrease Outcome: Not Progressing   Problem: Nutrition: Goal: Adequate nutrition will be maintained Outcome: Not Progressing   Problem: Coping: Goal: Level of anxiety will decrease Outcome: Not Progressing   Problem: Elimination: Goal: Will not experience complications related to bowel motility Outcome: Not Progressing   Problem: Pain Managment: Goal: General experience of comfort will improve Outcome: Not Progressing   Problem: Safety: Goal: Ability to remain free from injury will improve Outcome: Not Progressing   Problem: Skin Integrity: Goal: Risk for impaired skin integrity will decrease Outcome: Not Progressing   Problem: Education: Goal: Ability to demonstrate management of disease process will improve Outcome: Not Progressing Goal: Ability to verbalize understanding of medication therapies will improve Outcome: Not Progressing Goal: Individualized Educational Video(s) Outcome: Not Progressing   Problem: Activity: Goal: Capacity to  carry out activities will improve Outcome: Not Progressing   Problem: Cardiac: Goal: Ability to achieve and maintain adequate cardiopulmonary perfusion will improve Outcome: Not Progressing   Problem: Fluid Volume: Goal: Hemodynamic stability will improve Outcome: Not Progressing   Problem: Respiratory: Goal: Ability to maintain adequate ventilation will improve Outcome: Not Progressing

## 2018-07-01 NOTE — Progress Notes (Addendum)
eLink Physician-Brief Progress Note Patient Name: Regina Sandoval DOB: January 19, 1941 MRN: [id number]   Date of Service  06/23/2018  HPI/Events of Note  78 year old female with a dementia, atrial fibrillation, diastolic heart failure and hypertension was admitted to ICU because of sepsis and hemorrhagic shock.   eICU Interventions  After discussion with bedside intensivist family decided on making the patient DNR.  I confirmed the change of CODE STATUS with the patient daughter Shrill and change CODE STATUS to no code/DNR     Intervention Category Major Interventions: End of life / care limitation discussion  Glory Rosebush 07/01/2018, 8:31 PM   12:07 AM Now family is at bedside and requesting comfort care and extubate the patient.  As per the daughter shrill, patient had a discussion earlier to not continue on supporting measures.  Placed order for comfort care and extubation.  On Ativan and fentanyl.

## 2018-07-01 NOTE — Progress Notes (Signed)
Received report from night shift nurse from night shift RN. Patient laying in bed lethargic with BP in the 70's. Rapid response nurse and night shift NP at bedside. Fluid bolus going and PCCM consulted.

## 2018-07-01 NOTE — Care Plan (Addendum)
78 year old female with dementia, atrial fibrillation, chronic diastolic heart failure, HTN and decubitus ulcer admitted to Hospitalist service for sepsis secondary to unstageable sacral decubitus ulcer. PCCM consulted overnight for hypotension and lactic acidosis. She was given IVF bolus, pan-cultured and antibiotics broadened. Remained persistent hypotensive despite fluid resuscitation of 2.5L. On my exam, patient confused and bright red blood per rectum.   O: Blood pressure 91/60, pulse 64, temperature 98.4 F (36.9 C), resp. rate 18, height 5\' 7"  (1.702 m), weight 72 kg, SpO2 100 %.    Intake/Output from previous day:  Intake/Output Summary (Last 24 hours) at 07/30/2018 6294 Last data filed at 07/30/18 0357 Gross per 24 hour  Intake 2053.42 ml  Output 50 ml  Net 2003.42 ml    On my exam: Chronically ill appearing female, confused, unable to follow commands. Lungs clear to auscultation, no wheezing or rhonchi. Cardiac exam RRR, no murmurs. Abdomen soft, hyperactive bowel sounds, NTTP. Skin: Unstageable decubitus sacral ulcer POA  Interim Data: Lab Results  Component Value Date   WBC 31.2 (H) 07/30/18   HGB 9.1 (L) 07/30/18   HCT 28.1 (L) July 30, 2018   MCV 94.0 Jul 30, 2018   PLT 200 07-30-2018   Lab Results  Component Value Date   NA 139 July 30, 2018   K 4.9 07-30-2018   CL 108 07/30/2018   CO2 18 (L) 07/30/18   GLUCOSE 82 30-Jul-2018   BUN 46 (H) 07/30/2018   CREATININE 1.54 (H) July 30, 2018   CALCIUM 8.6 (L) 07-30-18   BCX 2/12 - NGTD BCX 2/6 - NGTD  Imaging, labs and tests noted above have been reviewed independently by me.  Impression/Plan:  Septic vs Hemorrhagic Shock -Heparin gtt DC'd at bedside -IVF  -Emergent PRBC transfusion x 2 -Type and Cross -Pressors to maintain MAPs >65 -PPI BID -GI Consult -Continue broad-spectrum antibiotics -F/u final BCX -Transfer to ICU bed  Goals of Care -Family wishes to continue aggressive medical care including  pressor support, line placement and defibrillation/shocks if indicated. NO chest compressions. NO intubation HOWEVER Daughter agrees INTUBATION OK for procedure. This has been communicated with GI team.  Independent Critical Care Time devoted to patient care services described in this note is  60 minutes. This time reflects time of care of this signee Dr. Mechele Collin. This critical care time does not reflect procedure time, or teaching time or supervisory time of PA/NP/Med student/Med Resident etc but does involve care discussion time with multiple family members and coordination of care   Mechele Collin, M.D. [facility] Pulmonary/Critical Care Medicine Pager: [phone] After hours pager: [phone]

## 2018-07-01 NOTE — Procedures (Signed)
Central Venous Catheter Insertion Procedure Note Faraday Balster [id number] 1941/01/06  Procedure: Insertion of Central Venous Catheter Indications: Assessment of intravascular volume, Drug and/or fluid administration and Frequent blood sampling  Procedure Details Consent: Risks of procedure as well as the alternatives and risks of each were explained to the (patient/caregiver).  Consent for procedure obtained. Time Out: Verified patient identification, verified procedure, site/side was marked, verified correct patient position, special equipment/implants available, medications/allergies/relevent history reviewed, required imaging and test results available.  Performed  Maximum sterile technique was used including antiseptics, cap, gloves, gown, hand hygiene, mask and sheet. Skin prep: Chlorhexidine; local anesthetic administered A antimicrobial bonded/coated triple lumen catheter was placed in the right femoral vein due to emergent situation using the Seldinger technique. Ultrasound guidance used.Yes.   Catheter placed to 20 cm. Blood aspirated via all 3 ports and then flushed x 3. Line sutured x 2 and dressing applied.  Evaluation Blood flow good Complications: No apparent complications Patient did tolerate procedure well. Chest X-ray ordered to verify placement.  CXR: not needed  [facility] Kanchan Gal ACNP Adolph Pollack PCCM Pager [phone] till 1 pm If no answer page [phone] 07/10/2018, 10:34 AM

## 2018-07-01 NOTE — Progress Notes (Signed)
PT Cancellation Note  Patient Details Name: Regina Sandoval MRN: [id number] DOB: October 30, 1940   Cancelled Treatment:    Reason Eval/Treat Not Completed: Medical issues which prohibited therapy. Per discussion with RN, pt intubated for procedure and not medically appropriate for hydrotherapy today. Will re-attempt tomorrow.  Laurina Bustle, PT, DPT Acute Rehabilitation Services Pager [phone] Office [phone]    [name] 07-17-2018, 11:25 AM

## 2018-07-01 NOTE — Progress Notes (Addendum)
Pharmacy Antibiotic Note  Regina Sandoval is a 78 y.o. female admitted on 06/30/2018 with sepsis 2/2 sacral wound infection, previously receiving treatment with ceftriaxone and Flagyl. Was also previously on vancomycin, last dose 1g IV received on 2/10 @ 2314. Patient became hypotensive overnight and was noted to have new lactic acidosis. Pharmacy has been consulted to dose vancomycin and meropenem for broader antibiotic coverage. WBC increase to 31.2, currently AF. Scr has increassed to 1.54, estimated CrCl ~29 mL/min.   Plan: Given unstable renal function, will check random vancomycin level and re-dose as appropriate Meropenem 1g IV q12h Recommend discontinuing Flagyl, as meropenem will provide adequate anaerobic coverage Monitor renal fx, clinical pic, cx results, and levels as appropriate  ADDENDUM: VR is 16 mcg/mL. Will give an additional vancomycin 1g IV x1 now and dose per levels. Have also discontinued Flagyl per verbal order from Dr. Everardo All.  Height: 5\' 4"  (162.6 cm) Weight: 158 lb 11.7 oz (72 kg) IBW/kg (Calculated) : 54.7  Temp (24hrs), Avg:97.7 F (36.5 C), Min:96.3 F (35.7 C), Max:98.6 F (37 C)  Recent Labs  Lab 07/17/2018 1341  06/26/18 0241 06/27/18 0510 06/28/18 0404 06/29/18 0317 06/30/18 0402 21-Jul-2018 0231 July 21, 2018 0627  WBC 27.8*  --  26.9* 26.1* 25.9* 23.3* 24.0* 31.2*  --   CREATININE  --    < > 0.68 1.05* 0.96 1.08*  --  1.54*  --   LATICACIDVEN 2.9*  --   --   --   --   --   --  8.3* 8.0*   < > = values in this interval not displayed.    Estimated Creatinine Clearance: 28.8 mL/min (A) (by C-G formula based on SCr of 1.54 mg/dL (H)).    Allergies  Allergen Reactions  . Penicillins Hives    Did it involve swelling of the face/tongue/throat, SOB, or low BP? Unknown Did it involve sudden or severe rash/hives, skin peeling, or any reaction on the inside of your mouth or nose? Unknown Did you need to seek medical attention at a hospital or doctor's office?  Unknown When did it last happen? Listed on [facility] If all above answers are "NO", may proceed with cephalosporin use.     Antimicrobials this admission: Cefepime 2/6 x1 Ceftriaxone 2/6 >> 2/12 Vancomycin 2/6 >>  Metronidazole 2/6 >> 2/12 Meropenem 2/12 >>  Microbiology results: 2/6 BCx: NG final 2/8 MRSA PCR: neg 2/12 BCx: collected  Thank you for allowing pharmacy to be a part of this patient's care.  Roderic Scarce Zigmund Daniel, PharmD, BCPS PGY2 Infectious Diseases Pharmacy Resident Phone: [phone] Jul 21, 2018       11:15 AM

## 2018-07-02 ENCOUNTER — Inpatient Hospital Stay (HOSPITAL_COMMUNITY): Payer: Medicare Other

## 2018-07-02 DIAGNOSIS — L089 Local infection of the skin and subcutaneous tissue, unspecified: Secondary | ICD-10-CM

## 2018-07-02 DIAGNOSIS — Z515 Encounter for palliative care: Secondary | ICD-10-CM

## 2018-07-02 DIAGNOSIS — L8990 Pressure ulcer of unspecified site, unspecified stage: Secondary | ICD-10-CM | POA: Diagnosis not present

## 2018-07-02 LAB — TYPE AND SCREEN
ABO/RH(D): O POS
Antibody Screen: NEGATIVE
Unit division: 0
Unit division: 0

## 2018-07-02 LAB — BPAM FFP
BLOOD PRODUCT EXPIRATION DATE: 202002132359
Blood Product Expiration Date: 202002122359
ISSUE DATE / TIME: 202002121406
ISSUE DATE / TIME: 202002121406
Unit Type and Rh: 6200
Unit Type and Rh: 8400

## 2018-07-02 LAB — PREPARE FRESH FROZEN PLASMA

## 2018-07-02 LAB — BPAM RBC
BLOOD PRODUCT EXPIRATION DATE: 202003092359
Blood Product Expiration Date: 202003092359
ISSUE DATE / TIME: 202002120824
ISSUE DATE / TIME: 202002120824
Unit Type and Rh: 5100
Unit Type and Rh: 5100

## 2018-07-02 MED ORDER — LORAZEPAM 2 MG/ML PO CONC: 1.0000 mg | ORAL | Status: DC | PRN

## 2018-07-02 MED ORDER — LORAZEPAM 2 MG/ML IJ SOLN: 1.0000 mg | INTRAMUSCULAR | Status: DC | PRN

## 2018-07-02 MED ORDER — LORAZEPAM 1 MG PO TABS: 1.0000 mg | ORAL_TABLET | ORAL | Status: DC | PRN

## 2018-07-02 MED ORDER — CHLORHEXIDINE GLUCONATE 0.12 % MT SOLN
15.0000 mL | Freq: Two times a day (BID) | OROMUCOSAL | Status: DC
  Administered 2018-07-02: 15 mL via OROMUCOSAL

## 2018-07-02 MED ORDER — ORAL CARE MOUTH RINSE
15.0000 mL | Freq: Two times a day (BID) | OROMUCOSAL | Status: DC
  Administered 2018-07-02: 15 mL via OROMUCOSAL

## 2018-07-03 NOTE — Progress Notes (Signed)
Family members just left [facility] but still undecided on the funeral parlor.  Briefed sister on the procedure of handling the patient's body.  Will advise the NT to clean body accordingly.

## 2018-07-03 NOTE — Progress Notes (Signed)
Body on the way to the morgue, security and Placement were made aware.

## 2018-07-05 DIAGNOSIS — R578 Other shock: Secondary | ICD-10-CM

## 2018-07-06 LAB — CULTURE, BLOOD (ROUTINE X 2)
Culture: NO GROWTH
Culture: NO GROWTH

## 2018-07-16 ENCOUNTER — Telehealth: Payer: Self-pay

## 2018-07-16 NOTE — Telephone Encounter (Signed)
DC received from [facility] (original). DC is for cremation. Patient is a patient of Doctor Everardo All.  DC will be taken to Pulmonary Unit for signature.

## 2018-07-17 NOTE — Telephone Encounter (Signed)
Received original D/C signed-Funeral home notified for pick -up.  

## 2018-07-19 NOTE — Progress Notes (Signed)
Patient expired at 2140 and confirmed by M. Rasing, RN with family members at bedside, comforted family members and they are still waiting for other family to arrive [facility] to pay their last respect, no funeral parlor is identified yet, eLink on-call was made aware thru text paged and still waiting for a return call.  Will update accordingly.

## 2018-07-19 NOTE — Progress Notes (Addendum)
   Events 78 year old female patient with history of dementia atrial fibrillation and chronic diastolic heart failure further complicated by decubitus ulcer initially admitted for sepsis in the setting of unstageable sacral ulcer.  Course has been complicated by Acute GI bleed (in setting of chronic A/C), with associated blood loss anemia subsequently further complicated by brief cardiac arrest during resuscitation effort on 2/12.  She was intubated, central line was placed.  She had previous goals of care limitations already set.  She was seen by gastroenterology in consultation emergently on 2/12.  It was the opinion of GI medicine that she would likely not tolerate nor recover quickly after endoscopic procedure.  Given the patient's chronic debilitated state there was concern about her ability to come off from life support.  Ultimately critical care and GI medicine both spoke with the family.  The family request to transition to comfort focused care at that point.  She was placed on a fentanyl infusion, she was terminally extubated on 2/13, since that point she appears comfortable on a fentanyl infusion.  Subjective Appears comfortable   Objective  Blood Pressure (Abnormal) 61/42 (BP Location: Right Arm)   Pulse 85   Temperature (Abnormal) 97.5 F (36.4 C) (Axillary)   Respiration 19   Height 5\' 4"  (1.626 m)   Weight 72 kg   Oxygen Saturation 95%   Body Mass Index 27.25 kg/m   Exam: General: She is unresponsive on fentanyl infusion Pulmonary: Episodes of apnea appreciated.  Diminished throughout Cardiac: Distant regular rate and rhythm Abdomen: Soft, hypoactive Neuro: Sedated GU: An uric Extremities: Pulses remain palpable  Impression/plan  Hemorrhagic shock 2/2 Acute GI bleed in context of chronic anticoagulation Cardiac arrest Sepsis secondary to infected sacral decubitus which is unstageable Chronic atrial fibrillation on chronic anticoagulation (DOAC) Chronic diastolic heart  failure Dementia Dyslipidemia Hypertension Presumed COPD Chronic deconditioning and debility Severe protein calorie malnutrition  Discussion We have since transitioned to comfort focused care per extensive discussion on 2/12.  She appears comfortable on fentanyl infusion  Plan Continue fentanyl drip for comfort Supportive care per family Discontinuing all medications that do not directly contribute to comfort  Transferring to med surgical floor [facility] to reassume care   Simonne Martinet ACNP-BC [facility] Pulmonary/Critical Care Pager # [phone] OR # [phone] if no answer

## 2018-07-19 NOTE — Progress Notes (Signed)
Nutrition Brief Note  Chart reviewed. Pt now transitioning to comfort care. No further nutrition interventions warranted at this time.  Please re-consult as needed.    Andrea Ferrer MS, RD, LDN, CNSC [phone] Pager  [phone] Weekend/On-Call Pager     

## 2018-07-19 NOTE — Progress Notes (Signed)
Patient on comfort care and is being transferred to [facility]. Called report to charge nurse. Patient transported comfortably without any complications. Family notified of transfer and awaiting patient in waiting room.

## 2018-07-19 NOTE — Progress Notes (Signed)
PT Cancellation Note  Patient Details Name: Regina Sandoval MRN: [id number] DOB: 1940-07-02   Cancelled Treatment:    Reason Eval/Treat Not Completed: Other (comment). Per discussion with RN, pt has been transferred to comfort care and no longer has hydrotherapy needs. Also notified MD Everardo All. PT hydrotherapy discontinued.  Laurina Bustle, PT, DPT Acute Rehabilitation Services Pager [phone] Office [phone]    [name] 07/07/2018, 11:21 AM

## 2018-07-19 NOTE — Social Work (Signed)
CSW acknowledging consult for comfort care. Will follow for disposition should hospice services or residential hospice become appropriate.   Stony Stegmann H Kinsly Hild, LCSWA [facility] Clinical Social Work [phone]   

## 2018-07-19 NOTE — Progress Notes (Addendum)
[facility] on-call, Dr. Janee Morn, the last PCCM MD who attended the patient, Dr. Everardo All will be signing the death certificate, [facility] was also made aware and patient is not a potential donor with CDS referral no. [id number].  Family members are still grieving inside the room, awaiting for other family members to arrive, no chaplain services requested.  Will await for family members to leave before body will be cleaned  and sent to the morgue.  Wasted 223 mL of Fentanyl in 250 mL NS bag in stericycle with M. Rasing as witness. Will update accordingly.

## 2018-07-19 NOTE — Procedures (Signed)
Extubation Procedure Note  Patient Details:   Name: Regina Sandoval DOB: [birth year] MRN: [id number]   Airway Documentation:    Vent end date: 07/08/2018 Vent end time: 0030   Evaluation  O2 sats: stable throughout Complications: No apparent complications Patient did tolerate procedure well. Bilateral Breath Sounds: Diminished, Clear   No    Pt terminally extubated per family request and per order.  Pt tolerated procedure well and was place on [place] at 3L. Family at bedside after extubation.   [name] M 07/01/2018, 12:31 AM

## 2018-07-19 NOTE — Death Summary Note (Signed)
DEATH SUMMARY   Patient Details  Name: Regina Sandoval MRN: [id number] DOB: March 09, 1941  Admission/Discharge Information   Admit Date:  07-24-18  Date of Death: Date of Death: 07-31-2018  Time of Death: Time of Death: 10/03/38  Length of Stay: 8  Referring Physician: Pecola Lawless, MD   Reason(s) for Hospitalization  sepsis secondary to unstageable sacral ulcer  Diagnoses  Preliminary cause of death: Hemorrhagic shock Secondary Diagnoses (including complications and co-morbidities):  Principal Problem:   Hemorrhagic shock (HCC) Active Problems:   HYPERCHOLESTEROLEMIA   Dementia without behavioral disturbance (HCC)   Essential hypertension   Persistent atrial fibrillation   CKD (chronic kidney disease)   Chronic diastolic heart failure (HCC)   Infected decubitus ulcer, unspecified pressure ulcer stage   Protein-calorie malnutrition, severe   Terminal care   Brief Hospital Course (including significant findings, care, treatment, and services provided and events leading to death)  Regina Sandoval is a 78 y.o. year old female who was admitted to Hospitalist service for sepsis secondary to unstageable sacral decubitus ulcer. On 2/11 PCCM consulted for hypotension and lactic acidosis. She was given IVF bolus, pan-cultured and antibiotics broadened. Remained persistent hypotensive despite total fluid resuscitation of 2.5L. On exam, patient had active melena per rectum leading to the the concern that patient was in hemorrhagic shock. Critical Care Team discussed patient's critical condition with son and daughter of patient who expressed wish for aggressive medical care if there was a chance for a treatable condition including temporary intubation for anticipated procedures. Patient was transferred to ICU and started on pressor support and blood transfusion. Post-intubation she had cardiac arrest and was resuscitated. After discussion with GI consult team, PCCM and family, decision was made to stop  aggressive management and plan to transition to comfort care. Patient expired at Jul 31, 2018 at 10/03/2138.  Pertinent Labs and Studies  Significant Diagnostic Studies Dg Abd 1 View  Result Date: 07/30/18 CLINICAL DATA:  Orogastric tube placement. EXAM: ABDOMEN - 1 VIEW COMPARISON:  Chest radiographs earlier same date. FINDINGS: 1155 hours. Orogastric tube has been placed, tip projecting over the sacrum and following a course suggesting location in the distal stomach. The visualized bowel gas pattern is normal. There is no evidence of free intraperitoneal air. Mild volume loss in the right hemithorax is exaggerated by patient rotation. Right femoral line noted. IMPRESSION: Enteric tube tip projects over the sacrum, likely in the distal stomach. Electronically Signed   By: Carey Bullocks M.D.   On: 2018-07-30 12:38   Portable Chest X-ray  Result Date: 07/30/18 CLINICAL DATA:  Endotracheal tube placement. EXAM: PORTABLE CHEST 1 VIEW COMPARISON:  11/06/2015 radiographs FINDINGS: Cardiomegaly and tortuous/ectatic descending thoracic aorta again noted. An endotracheal tube is present with tip 8 mm above the carina, directed towards the RIGHT mainstem bronchus. No definite airspace disease, pleural effusion or pneumothorax noted. Mild pulmonary vascular congestion is present. IMPRESSION: Cardiomegaly with mild pulmonary vascular congestion. Endotracheal tube with tip 8 mm above the carina, recommend 1-2 cm retraction. Electronically Signed   By: Harmon Pier M.D.   On: 30-Jul-2018 10:59    Microbiology Recent Results (from the past 240 hour(s))  Blood culture (routine x 2)     Status: None   Collection Time: 07-24-2018  7:50 PM  Result Value Ref Range Status   Specimen Description BLOOD LEFT WRIST  Final   Special Requests   Final    BOTTLES DRAWN AEROBIC AND ANAEROBIC Blood Culture results may not be optimal due to an inadequate  volume of blood received in culture bottles   Culture   Final    NO GROWTH 5  DAYS Performed at [facility], [address], [place], [place] [address]    Report Status 06/30/2018 FINAL  Final  MRSA PCR Screening     Status: None   Collection Time: 06/27/18 11:12 PM  Result Value Ref Range Status   MRSA by PCR NEGATIVE NEGATIVE Final    Comment:        The GeneXpert MRSA Assay (FDA approved for NASAL specimens only), is one component of a comprehensive MRSA colonization surveillance program. It is not intended to diagnose MRSA infection nor to guide or monitor treatment for MRSA infections. Performed at [facility], [address], [place], [place] [address]   Culture, blood (routine x 2)     Status: None (Preliminary result)   Collection Time: 07/15/2018  7:20 AM  Result Value Ref Range Status   Specimen Description BLOOD LEFT HAND  Final   Special Requests   Final    BOTTLES DRAWN AEROBIC ONLY Blood Culture results may not be optimal due to an inadequate volume of blood received in culture bottles   Culture   Final    NO GROWTH 4 DAYS Performed at [facility], [address], [place], [place] [address]    Report Status PENDING  Incomplete  Culture, blood (routine x 2)     Status: None (Preliminary result)   Collection Time: 06/29/2018  7:21 AM  Result Value Ref Range Status   Specimen Description BLOOD RIGHT HAND  Final   Special Requests   Final    BOTTLES DRAWN AEROBIC ONLY Blood Culture results may not be optimal due to an inadequate volume of blood received in culture bottles   Culture   Final    NO GROWTH 4 DAYS Performed at [facility], [address], [place], [place] [address]    Report Status PENDING  Incomplete    Lab Basic Metabolic Panel: Recent Labs  Lab 06/29/18 0317 06/30/2018 0231 07/11/2018 1057 06/29/2018 1110  NA 139 139 144 143  K 3.3* 4.9 3.9 4.3  CL 108 108  --  116*  CO2 19* 18*  --  14*  GLUCOSE 96 82  --  155*  BUN 40* 46*  --  42*  CREATININE 1.08* 1.54*  --  1.37*  CALCIUM 8.4* 8.6*  --   7.7*  MG  --  1.7  --   --    Liver Function Tests: Recent Labs  Lab 07/10/2018 1110  AST 355*  ALT 92*  ALKPHOS 64  BILITOT 0.9  PROT 4.0*  ALBUMIN 1.3*   No results for input(s): LIPASE, AMYLASE in the last 168 hours. No results for input(s): AMMONIA in the last 168 hours. CBC: Recent Labs  Lab 06/29/18 0317 06/30/18 0402 07/10/2018 0231 06/22/2018 1057 06/22/2018 1110 07/17/2018 1335  WBC 23.3* 24.0* 31.2*  --  33.0* 34.2*  HGB 10.8* 10.8* 9.1* 10.5* 11.5* 12.2  HCT 34.0* 32.3* 28.1* 31.0* 34.6* 37.0  MCV 89.9 89.7 94.0  --  92.8 92.3  PLT 183 214 200  --  157  162 174   Cardiac Enzymes: No results for input(s): CKTOTAL, CKMB, CKMBINDEX, TROPONINI in the last 168 hours. Sepsis Labs: Recent Labs  Lab 06/30/18 0402  06/26/2018 0231 07/07/2018 0627 07/15/2018 1110 06/22/2018 1335 06/27/2018 1452 06/30/2018 1700  WBC 24.0*  --  31.2*  --  33.0*  34.2*  --   --   LATICACIDVEN  --    < > 8.3* 8.0* 6.7*  --  6.0* 3.8*   < > = values in this interval not displayed.    Procedures/Operations  ETT  2/12 CVC 2/12 ACLS 2/12   Yliana Gravois Mechele CollinJane Carston Riedl 07/05/2018, 7:32 PM

## 2018-07-19 DEATH — deceased

## 2020-04-16 IMAGING — DX DG CHEST 1V PORT
1 series · 1 of 1 positions shown · non-contrast
Comparison: 11/06/2015 radiographs

CLINICAL DATA: Endotracheal tube placement.

EXAM:
PORTABLE CHEST 1 VIEW

[chest]
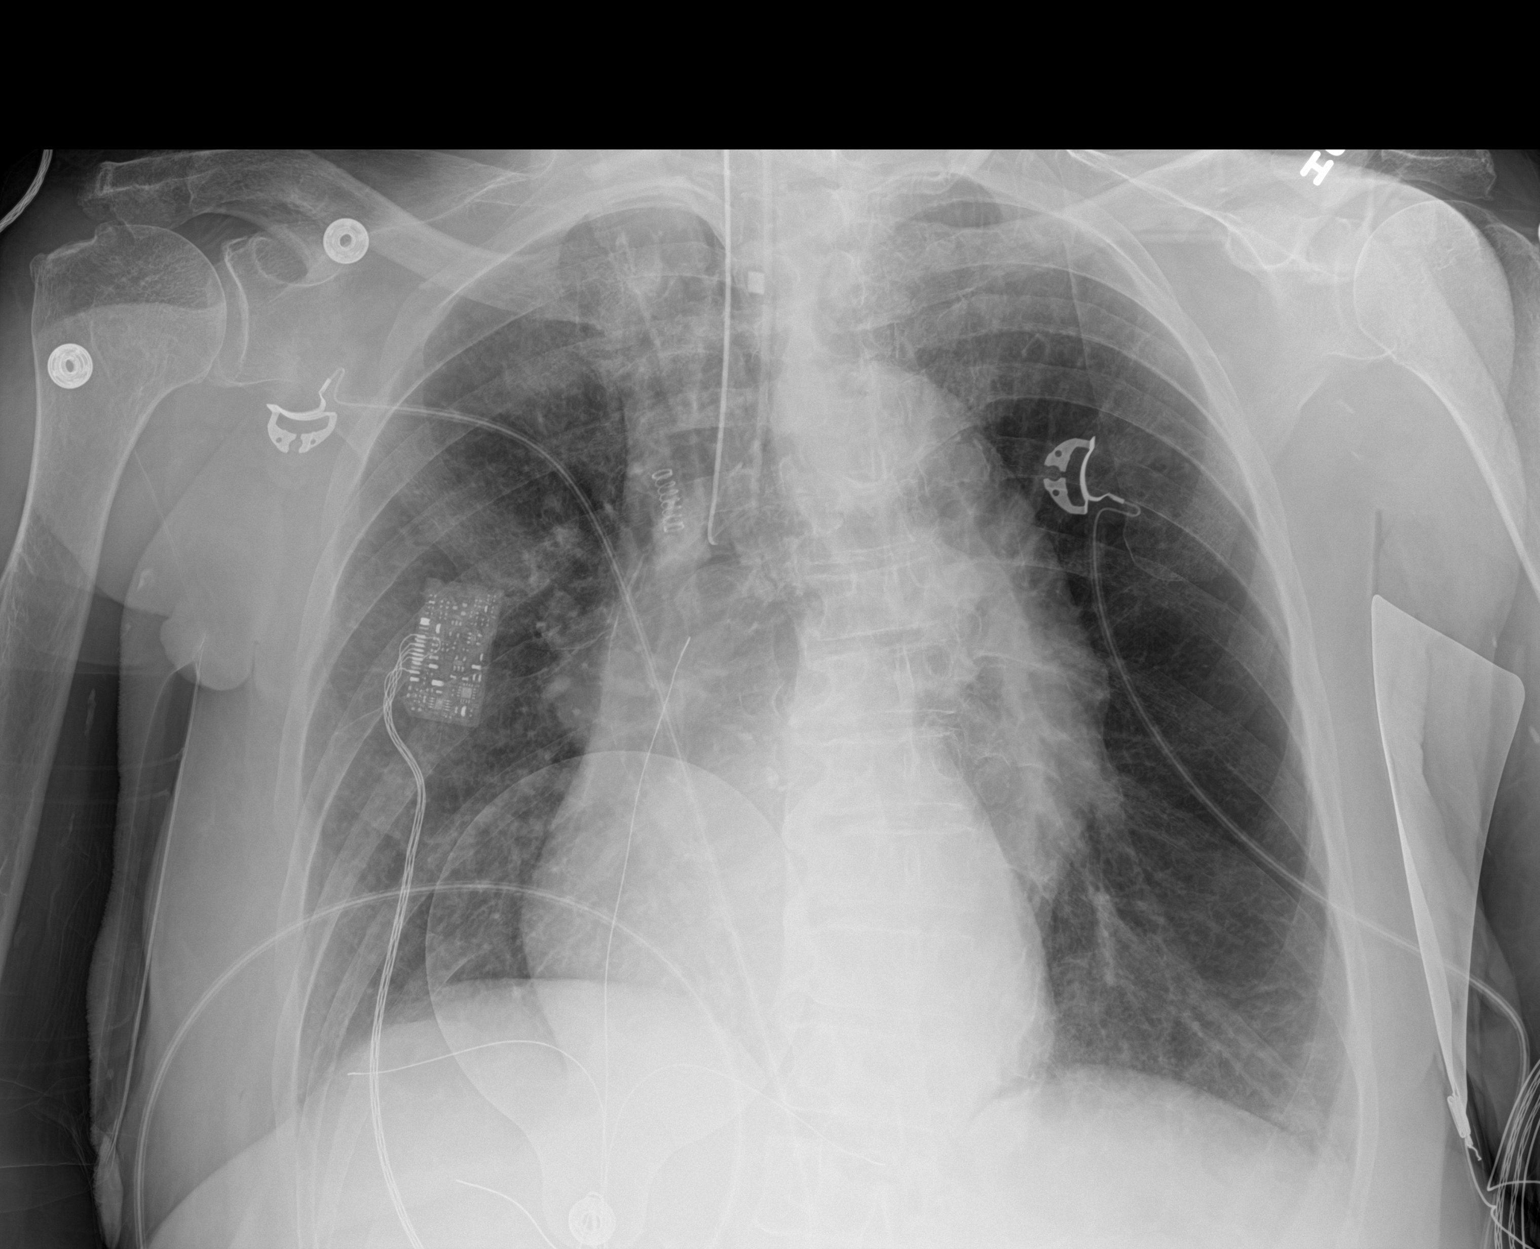

[1 of 1 positions shown; findings below may reference images not displayed]

FINDINGS: Cardiomegaly and tortuous/ectatic descending thoracic aorta again
noted.

An endotracheal tube is present with tip 8 mm above the carina,
directed towards the RIGHT mainstem bronchus.

No definite airspace disease, pleural effusion or pneumothorax
noted.

Mild pulmonary vascular congestion is present.
IMPRESSION: Cardiomegaly with mild pulmonary vascular congestion.

Endotracheal tube with tip 8 mm above the carina, recommend 1-2 cm
retraction.
